# Patient Record
Sex: Male | Born: 1945 | Race: White | Hispanic: No | Marital: Married | Smoking: Former smoker
Health system: Southern US, Community
[De-identification: ages and names within clinical notes are randomized; demographics above are authoritative.]

## PROBLEM LIST (undated history)

## (undated) DIAGNOSIS — C44310 Basal cell carcinoma of skin of unspecified parts of face: Secondary | ICD-10-CM

## (undated) DIAGNOSIS — R748 Abnormal levels of other serum enzymes: Secondary | ICD-10-CM

## (undated) DIAGNOSIS — E119 Type 2 diabetes mellitus without complications: Secondary | ICD-10-CM

## (undated) DIAGNOSIS — F410 Panic disorder [episodic paroxysmal anxiety] without agoraphobia: Secondary | ICD-10-CM

## (undated) DIAGNOSIS — Z955 Presence of coronary angioplasty implant and graft: Secondary | ICD-10-CM

## (undated) DIAGNOSIS — E7800 Pure hypercholesterolemia, unspecified: Secondary | ICD-10-CM

## (undated) DIAGNOSIS — R001 Bradycardia, unspecified: Secondary | ICD-10-CM

## (undated) DIAGNOSIS — I10 Essential (primary) hypertension: Secondary | ICD-10-CM

## (undated) DIAGNOSIS — A048 Other specified bacterial intestinal infections: Secondary | ICD-10-CM

## (undated) DIAGNOSIS — E669 Obesity, unspecified: Secondary | ICD-10-CM

## (undated) DIAGNOSIS — Z8739 Personal history of other diseases of the musculoskeletal system and connective tissue: Secondary | ICD-10-CM

## (undated) DIAGNOSIS — E039 Hypothyroidism, unspecified: Secondary | ICD-10-CM

## (undated) DIAGNOSIS — I351 Nonrheumatic aortic (valve) insufficiency: Secondary | ICD-10-CM

## (undated) DIAGNOSIS — K219 Gastro-esophageal reflux disease without esophagitis: Secondary | ICD-10-CM

## (undated) DIAGNOSIS — D696 Thrombocytopenia, unspecified: Secondary | ICD-10-CM

## (undated) DIAGNOSIS — I2510 Atherosclerotic heart disease of native coronary artery without angina pectoris: Secondary | ICD-10-CM

## (undated) DIAGNOSIS — R000 Tachycardia, unspecified: Secondary | ICD-10-CM

## (undated) DIAGNOSIS — I219 Acute myocardial infarction, unspecified: Secondary | ICD-10-CM

## (undated) HISTORY — DX: Abnormal levels of other serum enzymes: R74.8

## (undated) HISTORY — DX: Panic disorder (episodic paroxysmal anxiety): F41.0

## (undated) HISTORY — PX: TONSILLECTOMY: SUR1361

## (undated) HISTORY — PX: TRIGGER FINGER RELEASE: SHX641

## (undated) HISTORY — DX: Pure hypercholesterolemia, unspecified: E78.00

## (undated) HISTORY — PX: CORONARY ANGIOPLASTY WITH STENT PLACEMENT: SHX49

## (undated) HISTORY — DX: Acute myocardial infarction, unspecified: I21.9

## (undated) HISTORY — DX: Bradycardia, unspecified: R00.1

## (undated) HISTORY — DX: Thrombocytopenia, unspecified: D69.6

## (undated) HISTORY — DX: Obesity, unspecified: E66.9

## (undated) HISTORY — DX: Essential (primary) hypertension: I10

## (undated) HISTORY — DX: Atherosclerotic heart disease of native coronary artery without angina pectoris: I25.10

## (undated) HISTORY — DX: Tachycardia, unspecified: R00.0

## (undated) HISTORY — PX: BASAL CELL CARCINOMA EXCISION: SHX1214

## (undated) HISTORY — DX: Hemochromatosis, unspecified: E83.119

## (undated) HISTORY — DX: Nonrheumatic aortic (valve) insufficiency: I35.1

---

## 2000-11-09 DIAGNOSIS — I219 Acute myocardial infarction, unspecified: Secondary | ICD-10-CM

## 2000-11-09 HISTORY — DX: Acute myocardial infarction, unspecified: I21.9

## 2007-11-15 ENCOUNTER — Encounter (INDEPENDENT_AMBULATORY_CARE_PROVIDER_SITE_OTHER): Payer: Self-pay | Admitting: Orthopedic Surgery

## 2007-11-15 ENCOUNTER — Ambulatory Visit (HOSPITAL_BASED_OUTPATIENT_CLINIC_OR_DEPARTMENT_OTHER): Admission: RE | Admit: 2007-11-15 | Discharge: 2007-11-15 | Payer: Self-pay | Admitting: Orthopedic Surgery

## 2008-10-23 ENCOUNTER — Encounter: Admission: RE | Admit: 2008-10-23 | Discharge: 2008-10-23 | Payer: Self-pay | Admitting: Orthopedic Surgery

## 2008-10-25 ENCOUNTER — Encounter (INDEPENDENT_AMBULATORY_CARE_PROVIDER_SITE_OTHER): Payer: Self-pay | Admitting: Orthopedic Surgery

## 2008-10-25 ENCOUNTER — Ambulatory Visit (HOSPITAL_BASED_OUTPATIENT_CLINIC_OR_DEPARTMENT_OTHER): Admission: RE | Admit: 2008-10-25 | Discharge: 2008-10-25 | Payer: Self-pay | Admitting: Orthopedic Surgery

## 2010-08-29 ENCOUNTER — Ambulatory Visit: Payer: Self-pay | Admitting: Cardiology

## 2011-03-24 NOTE — Op Note (Signed)
NAME:  Miguel Vasquez, BABE                ACCOUNT NO.:  [id number]   MEDICAL RECORD NO.:  [id number]          PATIENT TYPE:  AMB   LOCATION:  DSC                          FACILITY:  [facility]   PHYSICIAN:  Katy Fitch. Sypher, M.D. DATE OF BIRTH:  1946-04-20   DATE OF PROCEDURE:  11/15/2007  DATE OF DISCHARGE:                               OPERATIVE REPORT   PREOPERATIVE DIAGNOSIS:  Recurrent left hand Dupuytren's contracture  status post prior resection from palm and small finger in 1996 at [facility] with recurrence of severe PIP and DIP flexion contractures  of small finger, severe MP and PIP flexion contractures of ring finger  and MP flexion contracture of long finger.   OPERATION:  1. Excision of Dupuytren's contracture of left ring finger and palm      with V-Y advancement flaps for skin lengthening.  2. Resection of Dupuytren's contracture from palm and long finger with      V-Y advancement flaps.  3. Resection of Dupuytren's contracture overlying PIP and DIP joints      of left small finger recurrent requiring complex dissection through      prior surgical scar.   OPERATIONS:  Josephine Igo, M.D.   ASSISTANT:  Annye Rusk, P.A.-C.   ANESTHESIA:  Is general sedation and a left infraclavicular block;  supervising anesthesiologist is Dr. Sampson Goon.   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  He has had a history of gout,  coronary artery disease and Dupuytren's palmar fibromatosis.  He is  status post resection of Dupuytren's disease from his left palm and  small finger performed at [facility] in 1996.  He has had coronary  artery disease and has had multiple stents placed.   He was referred by his primary care physician, Dr. Cherlynn Polo, of [place], [place], for consult due to recurrent bilateral Dupuytren's  disease.  He had very difficult flexion fractures of the small finger at  the PIP  and DIP joint and PIP and MP flexion fractures of the long and  ring fingers.   We advised to proceed with repeat surgery.   He understands that the risk of revision surgery is considerably greater  given our difficulty identifying the neurovascular anatomy in a prior  surgical scar.  He understands that Dupuytren's fibromatosis is not  curable and that our surgery is a palliative salvage procedure to  relieve his flexion contractures and removed most of the bulky nodular  disease in his palm.   He understands that there is a high probability of extension into other  fingers and possible recurrence in the operated finger despite our best  efforts at eradicating this pathologic fibromatosis.  Preoperatively,  questions were invited and answered in detail.  He was provided Ancef as  a preoperative IV prophylactic antibiotic.   PROCEDURE:  Carmino Ocain is brought to the operating room and placed in  supine position on the operating table.   Dr. Sampson Goon provided a preoperative anesthesia consult recommending  proceeding with a left infraclavicular block.  This  was placed in the  holding area without complication.   Mr. Lyness was taken to Room 6, placed in supine position on the operating  table, and IV sedation provided.  A pneumatic tourniquet was applied to  this proximal brachium followed by routine Betadine scrub and paint of  the left arm.  The arm was draped with sterile stockinette and  impervious arthroscopy drapes followed by exsanguination left arm with  Esmarch bandage and inflation of the arterial tourniquet to 240 mmHg.  The procedure commenced with planning Brunner zigzag incisions that  would be lengthened by V-Y advancement technique for skin lengthening on  the palmar surface of the fingers.   The skin incisions were taken sharply and elevated at the plane of the  dermis and pathologic fascia.  The common neurovascular structures were  identified at the level the  superficial palmar arch and dissected  distally.  Extensive pretendinous disease was removed from the small  finger, ring finger and long finger followed by spiral bands, lateral  fascial sheets and central cord in the ring finger, a large radial  lateral fascial sheet nodule and extensive disease crossing the PIP and  DIP joints on the radial aspect of the small finger.  The long finger  had disease in the natatory ligaments and the pretendinous fibers.  After resection of all visible pathologic fascia at the pinning of the  palm and the MP and PIP flexion contractures were relieved in the long  and ring fingers and the PIP flexion contracture was relieved in the  small finger.  The small finger had a PIP flexion contracture due to  scarring of the skin.  This was addressed by both resection of subdermal  fascial nodules and scar followed by an extensive V-Y advancement flap  creation on the palmar surface of the small finger, increasing the skin  length approximately 1 cm.   Bleeding points were electrocauterized with bipolar forceps under saline  followed by repair of the skin with corner sutures of 5-0 nylon and  multiple interrupted sutures of 5-0 nylon.   There were no apparent complications.   Mr. Natividad's finger contractures were fully relieved including the distal  contracture of the small finger.  The hand was placed in a voluminous  dressing of Silvadene, Adaptic, sterile gauze, sterile Kerlix and a  volar plaster splint maintaining the fingers in full extension.  The  tourniquet released with adequate capillary refill in all fingertips.   For aftercare, Mr. Bitter will elevate his hand for 48 hours and is  provided prescriptions for Percocet 5 mg 1 p.o. q.4-6h. p.r.n. pain, 30  tablets without refill.  Also, Motrin 600 mg 1 p.o. q.6h. for as many  days as necessary p.r.n. pain and Keflex 500 mg 1 p.o. q.8h. x4 days.      Katy Fitch Sypher, M.D.  Electronically  Signed     RVS/MEDQ  D:  11/15/2007  T:  11/15/2007  Job:  [id number]   cc:   Dwaine Gale.  Katy Fitch Sypher, M.D.

## 2011-03-24 NOTE — Op Note (Signed)
NAME:  Miguel Vasquez, Miguel Vasquez                ACCOUNT NO.:  [id number]   MEDICAL RECORD NO.:  [id number]          PATIENT TYPE:  AMB   LOCATION:  DSC                          FACILITY:  [facility]   PHYSICIAN:  Katy Fitch. Sypher, M.D. DATE OF BIRTH:  07/27/1946   DATE OF PROCEDURE:  10/25/2008  DATE OF DISCHARGE:                               OPERATIVE REPORT   PREOPERATIVE DIAGNOSIS:  Complex Dupuytren contracture right hand  involving palm, small, ring, index and thumb.   POSTOPERATIVE DIAGNOSIS:  Complex Dupuytren contracture right hand  involving palm, small, ring, index and thumb.   OPERATION:  1. Resection of complex Dupuytren contracture from right palm and      small finger with multiple V-Y advancement flaps for skin      lengthening.  2. Resection of Dupuytren  contracture from palm and ring finger to      separate dissection subfascially.  3. Resection of Dupuytren contracture from index finger.  4. Resection of Dupuytren contracture from the thumb.   SURGEON:  Katy Fitch. Sypher, MD   ASSISTANT:  Annye Rusk PA-C   ANESTHESIA:  General sedation supplemented by a infraclavicular block   SUPERVISING ANESTHESIOLOGIST:  Zenon Mayo, MD.   ESTIMATED BLOOD LOSS:  100 mL.   No replacement.   There were no apparent complications.   TOURNIQUET TIME:  Approximately 1 hour and 55 minutes.   INDICATIONS:  Miguel Vasquez is a 65 year old gentleman with a severe  Dupuytren's diaphysis.  He is status post surgery to the left hand in  January 2009.  He has had a very satisfactory long-term result.  He now  presents for identical surgery on the right.  Preoperatively, he was  reminded of the severe scarring accompanying the Dupuytren's  contracture.  He understands that we will do our best to remove the  large nodules of pathologic fascia and the cords causing his flexion  contractures.  He understands that we cannot control recurrence of this  disease despite our best efforts and  at later date he might require  further surgery and/or skin grafting.   He understands that he will need to splint postoperatively and that  there are risks of surgery including neurovascular injury, possible  infection and a possible rapid recurrence of his disease.   PROCEDURE:  Miguel Vasquez was brought to the operating room and placed in  supine position upon the operating table.   Following placement of an infraclavicular block by Dr. Sampson Goon in the  holding area, excellent anesthesia of the right upper extremity  was  achieved.  He is brought to room 8 of the [facility], placed  in supine position upon the operating table and under Dr. Jarrett Ables  direct supervision sedation provided.   The right arm was prepped with Betadine soap solution and sterilely  draped.  A pneumatic tourniquet was applied to the proximal brachium.   Following exsanguination of the right arm with Esmarch bandage, the  arterial tourniquet was inflated to 220 mmHg.  The procedure commenced  with planning of Brunner zigzag incisions with an open palm technique  overlying the ring and small finger metacarpals.  The thumb was  addressed with a Brunner zigzag incision at the proximal thumb flexion  crease and the index finger with a similar Brunner incision at the  proximal finger flexion crease.   The skin flaps were elevated at the fascia dermis junction followed by  placement of multiple 4-0 silk sutures retaining the skin flaps.  After  undermining the skin, the complex pathologic fascial contracture was  resected from the pretendinous fibers of the ring and small fingers in  the palm, followed by extensive dissection of lateral fascial sheets in  both the radial and ulnar side of the small finger, a retrovascular cord  on the ulnar aspect of the small finger, a central cord distally and a  large nodule on the ulnar aspect of the DIP joint.   A flexion contracture that led to a  pulp-to-palm distance of only 2 cm  was corrected to full extension at the MP, PIP and DIP joints.  We were  required to release the flexor sheath between the A2 and A3 pulleys with  release of the C1 pulley.  The volar plate was likewise released.   In the ring finger, extensive fascial involvement of the natatory  ligaments was removed followed by removal of the nodule overlying the  metacarpal head region.  This fully relieved the MP flexion fracture of  the ring finger.  The interphalangeal joints fully extended after  release of these nodules and cords.  At the thumb index web space, a  very complex nodule was removed at the proximal flexion crease of the  thumb.  We carefully identified the princeps pollicis artery, the ulnar  and radial proper digital nerves and resected the pathologic fascia.  We  performed a similar dissection near the radial proper digital artery  nerve of the index finger on the radial aspect of the finger removing a  large nodule and cord system at the level of the proximal finger flexion  crease.   Some nodular disease in the region of the thumb index web space was left  for fear of causing excessive scar with too much dissection in this  area.   The tourniquet was released after dissection was completed with  immediate capillary fill to the thumb, index, long, ring and within 1  minute small finger.  Normal turgor returned to all fingers.  The wounds  were then irrigated and meticulously inspected for bleeding points which  were electrocauterized with bipolar current under saline followed by  removal of the tourniquet and compression for 5 minutes.  The skin flaps  were then repaired with an open palm technique and an open technique at  the small finger MP flexion crease due to the severe contracture that  was relieved and a concern about possible hematoma formation deep to the  flaps.   After completion of the wound repair with multiple interrupted  sutures  of 5-0 nylon, a dressing of Adaptic, Silvadene, sterile gauze, sterile  Kerlix and sterile Webril was applied with a volar plaster splint  maintaining the MP joints in full extension and comfortable flexion of  the PIP and DIP joints.   There were no apparent complications.  Mr. Crass tolerated the surgery  and anesthesia well.  He was transferred to the recovery room with  stable vital signs.  He will be discharged home with prescriptions for  Dilaudid 2 mg one to two tablets p.o. q.4-6 h. p.r.n. pain 30 tablets  without refill, also Motrin 600 mg one p.o. q.6 h. p.r.n. pain 30  tablets without refill and Keflex 500 mg one p.o. q.8 h. x4 days as a  prophylactic antibiotic.      Katy Fitch Sypher, M.D.  Electronically Signed     RVS/MEDQ  D:  10/25/2008  T:  10/26/2008  Job:  [id number]   cc:   Dwaine Gale.

## 2011-08-14 LAB — BASIC METABOLIC PANEL
BUN: 13 mg/dL (ref 6–23)
BUN: 14
CO2: 24
Chloride: 105 mEq/L (ref 96–112)
Creatinine, Ser: 0.74 mg/dL (ref 0.4–1.5)
GFR calc non Af Amer: >60
Glucose, Bld: 128 mg/dL — ABNORMAL HIGH (ref 70–99)
Glucose, Bld: 126 — ABNORMAL HIGH
Potassium: 3.9

## 2011-09-02 ENCOUNTER — Encounter: Payer: Self-pay | Admitting: Cardiology

## 2011-09-02 ENCOUNTER — Ambulatory Visit (INDEPENDENT_AMBULATORY_CARE_PROVIDER_SITE_OTHER): Payer: Self-pay | Admitting: Cardiology

## 2011-09-02 DIAGNOSIS — E7800 Pure hypercholesterolemia, unspecified: Secondary | ICD-10-CM

## 2011-09-02 DIAGNOSIS — I2510 Atherosclerotic heart disease of native coronary artery without angina pectoris: Secondary | ICD-10-CM | POA: Insufficient documentation

## 2011-09-02 DIAGNOSIS — I259 Chronic ischemic heart disease, unspecified: Secondary | ICD-10-CM

## 2011-09-02 DIAGNOSIS — I10 Essential (primary) hypertension: Secondary | ICD-10-CM | POA: Insufficient documentation

## 2011-09-02 DIAGNOSIS — E119 Type 2 diabetes mellitus without complications: Secondary | ICD-10-CM | POA: Insufficient documentation

## 2011-09-02 MED ORDER — PRAVASTATIN SODIUM 20 MG PO TABS: 20.0000 mg | ORAL_TABLET | Freq: Every evening | ORAL | Status: DC

## 2011-09-02 NOTE — Assessment & Plan Note (Signed)
He has had remote stenting of left circumflex coronary 1996 and stenting of the right coronary in 2002. His last evaluation was 3 years ago. We will schedule him for a followup last stress Myoview study. If this is unremarkable I will see him back again in one year.

## 2011-09-02 NOTE — Patient Instructions (Signed)
We will schedule you for a nuclear stress test.  Continue your current medications.  Watch your diet carefully. You may want to decrease your beer intake.

## 2011-09-02 NOTE — Assessment & Plan Note (Signed)
He needs to do a better job with his diet. Have recommended he reduce his alcohol intake since this will affect his triglycerides. He needs to lose some weight. He is scheduled for followup lab work in January.

## 2011-09-02 NOTE — Assessment & Plan Note (Signed)
Blood pressure control today he is acceptable. We'll continue with his current therapy.

## 2011-09-02 NOTE — Progress Notes (Signed)
[employer] Date of Birth: 1945-11-14 Medical Record #[id number]  History of Present Illness: Miguel Vasquez is seen for yearly followup today. She has been feeling very well. He denies any symptoms of chest pain, shortness of breath, or palpitations. He notes that his Lipitor was discontinued because he was experiencing some muscle aches in his CPK was elevated. He has been on pravastatin for the last 3-4 months. He thinks that the CPK has returned to normal. His blood sugars have been doing well. He admits that he is not been doing very well with his diet and has gained some weight. He also drinks a fair amount of beer. He has a history of coronary disease and is status post stenting of the left circumflex coronary in 1996. He had stenting of the right coronary in 2002.  Current Outpatient Prescriptions on File Prior to Visit  Medication Sig Dispense Refill  . allopurinol (ZYLOPRIM) 300 MG tablet Take 300 mg by mouth daily.        [name] aspirin 81 MG tablet Take 81 mg by mouth daily.        [name] atenolol (TENORMIN) 25 MG tablet Take 25 mg by mouth 2 (two) times daily.       [name] ezetimibe (ZETIA) 10 MG tablet Take 10 mg by mouth daily.        [name] FOLIC ACID PO Take by mouth daily.        [name] levothyroxine (SYNTHROID, LEVOTHROID) 100 MCG tablet Take 100 mcg by mouth daily.        . metFORMIN (GLUCOPHAGE) 500 MG tablet Take 500 mg by mouth daily.        [name] PARoxetine (PAXIL) 20 MG tablet Take 20 mg by mouth daily.        . ramipril (ALTACE) 10 MG capsule Take 10 mg by mouth daily.          No Known Allergies  Past Medical History  Diagnosis Date  . Hypercholesterolemia   . Coronary disease     Status post stenting of the left circumflex coronary in 1996--status post stenting of the right coronary in 2002  . Hypertension     Controlled  . Diabetes mellitus     Type 2  . Hemochromatosis   . Obesity   . History of ulcer disease   . Panic disorder     Hx of panic disorder  . Cervical disc disease    History of Cervical Disc Disease  . Myocardial infarction 2002    Past Surgical History  Procedure Date  . Coronary stent placement     x3    History  Smoking status  . Smoker, Current Status Unknown  Smokeless tobacco  . Not on file    History  Alcohol Use  . Yes    Family History  Problem Relation Age of Onset  . Heart attack Sister   . Cancer Mother     Colon Cancer  . Heart failure Mother   . Cancer Brother     Colon Cancer    Review of Systems: As noted in history of present illness.  All other systems were reviewed and are negative.  Physical Exam: BP 139/88  Pulse 55  Ht 6\' 2"  (1.88 m)  Wt 229 lb 12.8 oz (104.237 kg)  BMI 29.50 kg/m2 The patient is alert and oriented x 3.  The mood and affect are normal.  The skin is warm and dry.  Color is normal.  The HEENT exam reveals  that the sclera are nonicteric.  The mucous membranes are moist.  The carotids are 2+ without bruits.  There is no thyromegaly.  There is no JVD.  The lungs are clear.  The chest wall is non tender.  The heart exam reveals a regular rate with a normal S1 and S2.  There are no murmurs, gallops, or rubs.  The PMI is not displaced.   Abdominal exam reveals good bowel sounds.  There is no guarding or rebound.  There is no hepatosplenomegaly or tenderness.  There are no masses.  Exam of the legs reveal no clubbing, cyanosis, or edema.  The legs are without rashes.  The distal pulses are intact.  Cranial nerves II - XII are intact.  Motor and sensory functions are intact.  The gait is normal.  LABORATORY DATA: ECG today demonstrates sinus bradycardia with a rate of 53 beats per minute. It is otherwise normal. His most recent lipid panel on 07/14/2011 showed a total cholesterol of 214, triglycerides 365, HDL 41, and LDL of 100.  Assessment / Plan:

## 2011-09-16 ENCOUNTER — Ambulatory Visit (HOSPITAL_COMMUNITY): Payer: BC Managed Care – PPO | Attending: Cardiovascular Disease | Admitting: Radiology

## 2011-09-16 VITALS — Ht 74.0 in | Wt 229.0 lb

## 2011-09-16 DIAGNOSIS — I10 Essential (primary) hypertension: Secondary | ICD-10-CM | POA: Insufficient documentation

## 2011-09-16 DIAGNOSIS — Z87891 Personal history of nicotine dependence: Secondary | ICD-10-CM | POA: Insufficient documentation

## 2011-09-16 DIAGNOSIS — Z8249 Family history of ischemic heart disease and other diseases of the circulatory system: Secondary | ICD-10-CM | POA: Insufficient documentation

## 2011-09-16 DIAGNOSIS — R0609 Other forms of dyspnea: Secondary | ICD-10-CM | POA: Insufficient documentation

## 2011-09-16 DIAGNOSIS — I252 Old myocardial infarction: Secondary | ICD-10-CM

## 2011-09-16 DIAGNOSIS — I2510 Atherosclerotic heart disease of native coronary artery without angina pectoris: Secondary | ICD-10-CM | POA: Insufficient documentation

## 2011-09-16 DIAGNOSIS — R0989 Other specified symptoms and signs involving the circulatory and respiratory systems: Secondary | ICD-10-CM | POA: Insufficient documentation

## 2011-09-16 DIAGNOSIS — E119 Type 2 diabetes mellitus without complications: Secondary | ICD-10-CM

## 2011-09-16 DIAGNOSIS — E785 Hyperlipidemia, unspecified: Secondary | ICD-10-CM | POA: Insufficient documentation

## 2011-09-16 MED ORDER — TECHNETIUM TC 99M TETROFOSMIN IV KIT
33.0000 | PACK | Freq: Once | INTRAVENOUS | Status: AC | PRN
  Administered 2011-09-16: 33 via INTRAVENOUS

## 2011-09-16 MED ORDER — TECHNETIUM TC 99M TETROFOSMIN IV KIT
11.0000 | PACK | Freq: Once | INTRAVENOUS | Status: AC | PRN
  Administered 2011-09-16: 11 via INTRAVENOUS

## 2011-09-16 NOTE — Progress Notes (Signed)
[facility] SITE 3 NUCLEAR MED [address] [place] [address] [phone]  Cardiology Nuclear Med Study  Miguel Vasquez is a 65 y.o. male [id number] 22-Sep-1946   Nuclear Med Background Indication for Stress Test:  Evaluation for Ischemia, Stent Patency and Follow-up CAD History: '02 Heart Catheterization: RCA stent x2, '02 Myocardial Infarction, 10/09 Myocardial Perfusion Study: EF 66% and "02 Stents: RCA Cardiac Risk Factors: Family History - CAD, History of Smoking, Hypertension, Lipids and NIDDM  Symptoms:  DOE   Nuclear Pre-Procedure Caffeine/Decaff Intake:  None NPO After: 6:00pm   Lungs:  clear IV 0.9% NS with Angio Cath:  20g  IV Site: R Antecubital x 1, tolerated well IV Started by:  Irean Hong, RN  Chest Size (in):  44 Cup Size: n/a  Height: 6\' 2"  (1.88 m)  Weight:  229 lb (103.874 kg)  BMI:  Body mass index is 29.40 kg/(m^2). Tech Comments:  Held atenolol x 60 hrs    Nuclear Med Study 1 or 2 day study: 1 day  Stress Test Type:  Stress  Reading MD: Charlton Haws, MD  Order Authorizing Provider:  Ercia Crisafulli [place], MD  Resting Radionuclide: Technetium 7m Tetrofosmin  Resting Radionuclide Dose: 11.0 mCi   Stress Radionuclide:  Technetium 64m Tetrofosmin  Stress Radionuclide Dose: 33.0 mCi           Stress Protocol Rest HR: 59 Stress HR: 151  Rest BP: 150/93 Stress BP: 191/88  Exercise Time (min): 5:00 METS: 7.0   Predicted Max HR: 158 bpm % Max HR: 95.57 bpm Rate Pressure Product: 64403   Dose of Adenosine (mg):  n/a Dose of Lexiscan: n/a mg  Dose of Atropine (mg): n/a Dose of Dobutamine: n/a mcg/kg/min (at max HR)  Stress Test Technologist: Milana Na, EMT-P  Nuclear Technologist:  Doyne Keel, CNMT     Rest Procedure:  Myocardial perfusion imaging was performed at rest 45 minutes following the intravenous administration of Technetium 50m Tetrofosmin. Rest ECG: NSR  Stress Procedure:  The patient exercised for 5:00.  The patient  stopped due to doe, fatigue and denied any chest pain.  There were no significant ST-T wave changes.  Technetium 45m Tetrofosmin was injected at peak exercise and myocardial perfusion imaging was performed after a brief delay. Stress ECG: No significant change from baseline ECG  QPS Raw Data Images:  Normal; no motion artifact; normal heart/lung ratio. Stress Images:  There is decreased uptake in the inferior wall. Rest Images:  There is decreased uptake in the inferior wall. Subtraction (SDS):  There is a fixed defect that is most consistent with a previous infarction. Transient Ischemic Dilatation (Normal <1.22):  1.00 Lung/Heart Ratio (Normal <0.45):  0.32  Quantitative Gated Spect Images QGS EDV:  118 ml QGS ESV:  53 ml QGS cine images:  NL LV Function; NL Wall Motion QGS EF: 55%  Impression Exercise Capacity:  Poor exercise capacity. BP Response:  Normal blood pressure response. Clinical Symptoms:  There is dyspnea. ECG Impression:  No significant ST segment change suggestive of ischemia. Comparison with Prior Nuclear Study: No images to compare  Overall Impression:  Small inferobasal wall infarct.  SDS 6 but think there is misregistration of inferobase on vertical images.  No significant ischemia on qualitative review.   normal EF 55%    Charlton Haws

## 2011-09-17 ENCOUNTER — Encounter (HOSPITAL_COMMUNITY): Payer: BC Managed Care – PPO | Admitting: Radiology

## 2011-09-17 NOTE — Progress Notes (Signed)
lm

## 2011-09-18 ENCOUNTER — Telehealth: Payer: Self-pay | Admitting: Cardiology

## 2011-09-18 NOTE — Telephone Encounter (Signed)
Pt calling wanting to know results of CT scan. Please return pt call to discuss further.

## 2011-09-18 NOTE — Telephone Encounter (Signed)
Calling for results of myocardial perfusion scan.  Results given

## 2011-09-21 NOTE — Telephone Encounter (Signed)
Called having some questions re: stress test. Answered and he verbalizes understanding.

## 2011-09-21 NOTE — Telephone Encounter (Signed)
Pt has called back. [phone]. Please call within 30 minutes if at all possible.

## 2011-09-21 NOTE — Telephone Encounter (Signed)
Pt was calling back about test results will be leaving his home in 30 minutes.

## 2011-10-08 ENCOUNTER — Encounter: Payer: Self-pay | Admitting: Cardiology

## 2012-09-19 ENCOUNTER — Ambulatory Visit (INDEPENDENT_AMBULATORY_CARE_PROVIDER_SITE_OTHER): Payer: BC Managed Care – PPO | Admitting: Cardiology

## 2012-09-19 ENCOUNTER — Encounter: Payer: Self-pay | Admitting: Cardiology

## 2012-09-19 VITALS — BP 144/92 | HR 54 | Ht 74.0 in | Wt 237.0 lb

## 2012-09-19 DIAGNOSIS — I10 Essential (primary) hypertension: Secondary | ICD-10-CM

## 2012-09-19 DIAGNOSIS — E7800 Pure hypercholesterolemia, unspecified: Secondary | ICD-10-CM

## 2012-09-19 DIAGNOSIS — I2510 Atherosclerotic heart disease of native coronary artery without angina pectoris: Secondary | ICD-10-CM

## 2012-09-19 DIAGNOSIS — I259 Chronic ischemic heart disease, unspecified: Secondary | ICD-10-CM

## 2012-09-19 NOTE — Patient Instructions (Signed)
You need to focus more on your conditioning with increased aerobic activity and weight loss.  Continue your current medication

## 2012-09-19 NOTE — Progress Notes (Signed)
Miguel Vasquez Date of Birth: May 10, 1946 Medical Record #[id number]  History of Present Illness: Miguel Vasquez is seen for yearly followup today.  He has a history of coronary disease and is status post stenting of the left circumflex coronary in 1996. He had stenting of the right coronary in 2002. She is well. He denies any symptoms of chest pain or shortness of breath. He is currently off all statins because of muscle aches. He did have elevated CPK levels noted with Lipitor. He remains on Zetia. He reports his blood sugars have been less than 120. He has gained about 8 pounds this year and admits that he doesn't get much exercise. He did have a stress Myoview study one year ago showed a small fixed defect in the inferior base. This is felt to be artifactual. There is no evidence of ischemia and ejection fraction was normal at 58%.  Current Outpatient Prescriptions on File Prior to Visit  Medication Sig Dispense Refill  . allopurinol (ZYLOPRIM) 300 MG tablet Take 300 mg by mouth daily.        [name] aspirin 81 MG tablet Take 81 mg by mouth daily.        [name] atenolol (TENORMIN) 25 MG tablet Take 25 mg by mouth 2 (two) times daily.       [name] ezetimibe (ZETIA) 10 MG tablet Take 10 mg by mouth daily.        [name] FOLIC ACID PO Take by mouth daily.        [name] levothyroxine (SYNTHROID, LEVOTHROID) 100 MCG tablet Take 100 mcg by mouth daily.        . metFORMIN (GLUCOPHAGE) 500 MG tablet Take 500 mg by mouth daily.        [name] PARoxetine (PAXIL) 20 MG tablet Take 20 mg by mouth daily.        . ramipril (ALTACE) 10 MG capsule Take 10 mg by mouth daily.          No Known Allergies  Past Medical History  Diagnosis Date  . Hypercholesterolemia   . Coronary disease     Status post stenting of the left circumflex coronary in 1996--status post stenting of the right coronary in 2002  . Hypertension     Controlled  . Diabetes mellitus     Type 2  . Hemochromatosis   . Obesity   . History of ulcer disease   . Panic  disorder     Hx of panic disorder  . Cervical disc disease     History of Cervical Disc Disease  . Myocardial infarction 2002    Past Surgical History  Procedure Date  . Coronary stent placement     x3    History  Smoking status  . Former Smoker  . Start date: 09/20/1995  Smokeless tobacco  . Not on file    History  Alcohol Use  . Yes    Family History  Problem Relation Age of Onset  . Heart attack Sister   . Cancer Mother     Colon Cancer  . Heart failure Mother   . Cancer Brother     Colon Cancer    Review of Systems: As noted in history of present illness.  All other systems were reviewed and are negative.  Physical Exam: BP 144/92  Pulse 54  Ht 6\' 2"  (1.88 m)  Wt 237 lb (107.502 kg)  BMI 30.43 kg/m2 The patient is alert and oriented x 3.  The mood and affect are normal.  The skin is warm and dry.  Color is normal.  The HEENT exam reveals that the sclera are nonicteric.  The mucous membranes are moist.  The carotids are 2+ without bruits.  There is no thyromegaly.  There is no JVD.  The lungs are clear.  The chest wall is non tender.  The heart exam reveals a regular rate with a normal S1 and S2.  There are no murmurs, gallops, or rubs.  The PMI is not displaced.   Abdominal exam reveals good bowel sounds.  There is no guarding or rebound.  There is no hepatosplenomegaly or tenderness.  There are no masses.  Exam of the legs reveal no clubbing, cyanosis, or edema.  The legs are without rashes.  The distal pulses are intact.  Cranial nerves II - XII are intact.  Motor and sensory functions are intact.  The gait is normal.  LABORATORY DATA: ECG today demonstrates sinus bradycardia with a rate of 54 beats per minute. It is otherwise normal.   Assessment / Plan: 1. Coronary disease with prior interventions as noted. He is asymptomatic. His stress test for one year ago was negative. It did indicate significant deconditioning. I strongly encouraged him to participate  in regular aerobic exercise and to lose weight.  2. Hypercholesterolemia. Currently on Zetia. Also statins because of myalgias.  3. Diabetes mellitus type 2.  4. Hypertension. I think this would benefit significantly from weight loss and aerobic exercise.

## 2013-06-06 ENCOUNTER — Encounter: Payer: Self-pay | Admitting: Cardiology

## 2013-10-02 ENCOUNTER — Encounter (INDEPENDENT_AMBULATORY_CARE_PROVIDER_SITE_OTHER): Payer: Self-pay

## 2013-10-02 ENCOUNTER — Ambulatory Visit (INDEPENDENT_AMBULATORY_CARE_PROVIDER_SITE_OTHER): Payer: BC Managed Care – PPO | Admitting: Cardiology

## 2013-10-02 ENCOUNTER — Encounter: Payer: Self-pay | Admitting: Cardiology

## 2013-10-02 VITALS — BP 126/68 | HR 53 | Ht 74.0 in | Wt 214.8 lb

## 2013-10-02 DIAGNOSIS — I2510 Atherosclerotic heart disease of native coronary artery without angina pectoris: Secondary | ICD-10-CM

## 2013-10-02 DIAGNOSIS — I259 Chronic ischemic heart disease, unspecified: Secondary | ICD-10-CM

## 2013-10-02 DIAGNOSIS — E7800 Pure hypercholesterolemia, unspecified: Secondary | ICD-10-CM

## 2013-10-02 DIAGNOSIS — I10 Essential (primary) hypertension: Secondary | ICD-10-CM

## 2013-10-02 NOTE — Patient Instructions (Signed)
Continue your current therapy  I will see you in one year   

## 2013-10-02 NOTE — Progress Notes (Signed)
Miguel Vasquez Date of Birth: 1946/07/04 Medical Record #[id number]  History of Present Illness: Miguel Vasquez is seen for yearly followup today.  He has a history of coronary disease and is status post stenting of the left circumflex coronary in 1996. He had stenting of the right coronary in 2002. His last Myoview study was in November of 2012.  He denies any symptoms of chest pain or shortness of breath. He is currently off all statins because of muscle aches. He did have elevated CPK levels noted with Lipitor. He remains on Zetia. Was recently started on cholestyramine. LDL is up to 212. Triglycerides are 339. He has been watching his diet much closer and has lost 24 pounds. He is feeling very well and remains active.  Current Outpatient Prescriptions on File Prior to Visit  Medication Sig Dispense Refill  . allopurinol (ZYLOPRIM) 300 MG tablet Take 300 mg by mouth daily.        [name] aspirin 81 MG tablet Take 81 mg by mouth daily.        [name] atenolol (TENORMIN) 25 MG tablet Take 25 mg by mouth 2 (two) times daily.       [name] ezetimibe (ZETIA) 10 MG tablet Take 10 mg by mouth daily.        [name] FOLIC ACID PO Take by mouth daily.        [name] levothyroxine (SYNTHROID, LEVOTHROID) 100 MCG tablet Take 100 mcg by mouth daily.        . metFORMIN (GLUCOPHAGE) 500 MG tablet Take 500 mg by mouth daily.        [name] PARoxetine (PAXIL) 20 MG tablet Take 20 mg by mouth daily.        . ramipril (ALTACE) 10 MG capsule Take 10 mg by mouth daily.         No current facility-administered medications on file prior to visit.    No Known Allergies  Past Medical History  Diagnosis Date  . Hypercholesterolemia   . Coronary disease     Status post stenting of the left circumflex coronary in 1996--status post stenting of the right coronary in 2002  . Hypertension     Controlled  . Diabetes mellitus     Type 2  . Hemochromatosis   . Obesity   . History of ulcer disease   . Panic disorder     Hx of panic disorder  . Cervical  disc disease     History of Cervical Disc Disease  . Myocardial infarction 2002    Past Surgical History  Procedure Laterality Date  . Coronary stent placement      x3    History  Smoking status  . Former Smoker  . Start date: 09/20/1995  Smokeless tobacco  . Not on file    History  Alcohol Use  . Yes    Family History  Problem Relation Age of Onset  . Heart attack Sister   . Cancer Mother     Colon Cancer  . Heart failure Mother   . Cancer Brother     Colon Cancer    Review of Systems: As noted in history of present illness.  All other systems were reviewed and are negative.  Physical Exam: BP 126/68  Pulse 53  Ht 6\' 2"  (1.88 m)  Wt 214 lb 12.8 oz (97.433 kg)  BMI 27.57 kg/m2 He is a pleasant white male in no acute distress. The HEENT exam is normal.  The carotids are 2+ without bruits.  There is no thyromegaly.  There is no JVD.  The lungs are clear.  The heart exam reveals a regular rate with a normal S1 and S2.  There are no murmurs, gallops, or rubs.  The PMI is not displaced.   Abdominal exam reveals good bowel sounds.  There is no guarding or rebound.  There is no hepatosplenomegaly or tenderness.  There are no masses.  Exam of the legs reveal no clubbing, cyanosis, or edema.   The distal pulses are intact.  Cranial nerves II - XII are intact.  Motor and sensory functions are intact.  The gait is normal.  LABORATORY DATA: ECG today demonstrates sinus bradycardia with a rate of 49 beats per minute. It is otherwise normal.   Assessment / Plan: 1. Coronary disease with prior interventions as noted. He is asymptomatic. His stress test in November 2012 was negative. We will continue his current medical therapy and followup again in one year. Consider stress testing again next year.  2. Hypercholesterolemia. Currently on Zetia. Also statins because of myalgias. Now on Zetia and cholestyramine.  3. Diabetes mellitus type 2.  4. Hypertension. Blood pressure is  improved.

## 2014-09-09 HISTORY — PX: CARDIAC CATHETERIZATION: SHX172

## 2014-09-11 ENCOUNTER — Telehealth: Payer: Self-pay | Admitting: Cardiology

## 2014-09-11 NOTE — Telephone Encounter (Signed)
New Message  Pt called to move the appt up... Just in case he has a blockage,, He says that he is having signs of upper chest lower neck discomfort. Request a sooner appt. Declined PA or NP.

## 2014-09-11 NOTE — Telephone Encounter (Signed)
Spoke with patient. He is having some minor ("nothing major") chest discomfort. He would like a sooner appointment with Dr. Martinique. He was informed that there is nothing sooner, but was offered NP or PA appointment which he declined. Will forward to [place] to see if there is anything to be done about this.

## 2014-09-12 NOTE — Telephone Encounter (Signed)
Returned call to patient no answer.LMTC. 

## 2014-09-14 NOTE — Telephone Encounter (Signed)
Returned call to patient no answer.LMTC. 

## 2014-09-17 NOTE — Telephone Encounter (Signed)
Received call from patient he stated he has been having chest discomfort similar to pain he had before his stents.Stated has chest discomfort off and on, some days no chest discomfort.Stated he has appointment with Cecilie Kicks NP Thurs 09/20/14.Stated he would like to have stress test done.Stated he saw PCP last week and was told chest discomfort was reflux.Stated he wanted to make sure.Advised to keep appointment with Cecilie Kicks NP and she will order stress test.Advised to keep appointment with Dr.Jordan 10/08/14 at 3:45 pm.

## 2014-09-20 ENCOUNTER — Ambulatory Visit (INDEPENDENT_AMBULATORY_CARE_PROVIDER_SITE_OTHER): Payer: Medicare HMO | Admitting: Cardiology

## 2014-09-20 ENCOUNTER — Encounter: Payer: Self-pay | Admitting: Cardiology

## 2014-09-20 VITALS — BP 130/88 | HR 58 | Ht 74.0 in | Wt 223.9 lb

## 2014-09-20 DIAGNOSIS — I10 Essential (primary) hypertension: Secondary | ICD-10-CM

## 2014-09-20 DIAGNOSIS — E7800 Pure hypercholesterolemia, unspecified: Secondary | ICD-10-CM

## 2014-09-20 DIAGNOSIS — R079 Chest pain, unspecified: Secondary | ICD-10-CM

## 2014-09-20 DIAGNOSIS — I2510 Atherosclerotic heart disease of native coronary artery without angina pectoris: Secondary | ICD-10-CM

## 2014-09-20 MED ORDER — FENOFIBRATE 145 MG PO TABS: 145.0000 mg | ORAL_TABLET | Freq: Every day | ORAL | Status: DC

## 2014-09-20 MED ORDER — CHOLESTYRAMINE LIGHT 4 G PO PACK: 4.0000 g | PACK | Freq: Two times a day (BID) | ORAL | Status: DC

## 2014-09-20 MED ORDER — PANTOPRAZOLE SODIUM 40 MG PO TBEC: 40.0000 mg | DELAYED_RELEASE_TABLET | Freq: Every day | ORAL | Status: AC

## 2014-09-20 NOTE — Assessment & Plan Note (Signed)
controlled 

## 2014-09-20 NOTE — Assessment & Plan Note (Signed)
Has had recent chest pain, though does seem improved with protonix.  He is due for exercise myoview, 3 years since last with hx of CAD.  Will proceed with exercise myoview.  He will then keep appt with Dr. Mamie Nick. Martinique.

## 2014-09-20 NOTE — Progress Notes (Signed)
09/20/2014   PCP: Coy Saunas, MD   Chief Complaint  Patient presents with  . Follow-up    has appt with Dr.Jordan 11/30. had been having discomfort in chest PCP determined it was Acid Reflux. Dr.Jordan had metioned a stress test, patient wants stress test to make sure it is reflux    Primary Cardiologist:Dr. P. Martinique   HPI:  68 year old male with a history of coronary disease and is status post stenting of the left circumflex coronary in 1996. He had stenting of the right coronary in 2002. His last Myoview study was in November of 2012. He is currently off all statins because of muscle aches. He did have elevated CPK levels noted with Lipitor. He remains on Zetia. Was recently started on cholestyramine and fenofibrate.[name] LDL is now 145. Triglycerides are 357, tot. Chol is 257.  He has been watching his diet much closer and has lost 24 pounds but has gained back some. He is feeling well today and remains active.  Recently chest discomfort and seen by PCP.   Thought it was PPI symptoms and  seems better on Protonix.  Pt concerned about his heart- on last visit Dr. P. Martinique had recommended stress test this year.  The pt feels the discomfort similar to when he had stent placed.     Allergies  Allergen Reactions  . Statins Other (See Comments)    "Affects liver enzymes"    Current Outpatient Prescriptions  Medication Sig Dispense Refill  . allopurinol (ZYLOPRIM) 300 MG tablet Take 150 mg by mouth daily.     [name] aspirin 81 MG tablet Take 81 mg by mouth daily.      [name] atenolol (TENORMIN) 25 MG tablet Take 25 mg by mouth 2 (two) times daily.     [name] ezetimibe (ZETIA) 10 MG tablet Take 10 mg by mouth daily.      [name] FOLIC ACID PO Take by mouth daily.      [name] levothyroxine (SYNTHROID, LEVOTHROID) 100 MCG tablet Take 100 mcg by mouth daily.      . metFORMIN (GLUCOPHAGE) 500 MG tablet Take 500 mg by mouth 2 (two) times daily with a meal.     . OVER THE COUNTER MEDICATION  Cholestyram    . PARoxetine (PAXIL) 20 MG tablet Take 20 mg by mouth daily.      . ramipril (ALTACE) 10 MG capsule Take 10 mg by mouth daily.       No current facility-administered medications for this visit.    MEDS HAVE BEEN ADDED THAT WERE NOT ON MED LIST,  CHOLESTYRAMINE, FENFIBRATE 145 MG AND PROTONIX.   Past Medical History  Diagnosis Date  . Hypercholesterolemia   . Coronary disease     Status post stenting of the left circumflex coronary in 1996--status post stenting of the right coronary in 2002  . Hypertension     Controlled  . Diabetes mellitus     Type 2  . Hemochromatosis   . Obesity   . History of ulcer disease   . Panic disorder     Hx of panic disorder  . Cervical disc disease     History of Cervical Disc Disease  . Myocardial infarction 2002    Past Surgical History  Procedure Laterality Date  . Coronary stent placement      x3    GEX:BMWUXLK:GM colds or fevers, no weight changes Skin:no rashes or ulcers HEENT:no blurred vision, no congestion  CV:see HPI PUL:see HPI GI:no diarrhea constipation or melena, + indigestion if that was cause of pain. GU:no hematuria, no dysuria MS:no joint pain, no claudication Neuro:no syncope, no lightheadedness Endo:+ diabetes, + thyroid disease  Wt Readings from Last 3 Encounters:  09/20/14 223 lb 14.4 oz (101.56 kg)  10/02/13 214 lb 12.8 oz (97.433 kg)  09/19/12 237 lb (107.502 kg)    PHYSICAL EXAM BP 130/88 mmHg  Pulse 58  Ht 6\' 2"  (1.88 m)  Wt 223 lb 14.4 oz (101.56 kg)  BMI 28.73 kg/m2 General:Pleasant affect, NAD Skin:Warm and dry, brisk capillary refill HEENT:normocephalic, sclera clear, mucus membranes moist Neck:supple, no JVD, no bruits  Heart:S1S2 RRR without murmur, gallup, rub or click Lungs:clear without rales, rhonchi, or wheezes YEB:XIDH, non tender, + BS, do not palpate liver spleen or masses Ext:no lower ext edema, 2+ pedal pulses, 2+ radial pulses Neuro:alert and oriented, MAE, follows  commands, + facial symmetry  EKG:SR rate higher than last year but otherwise no changes.  ASSESSMENT AND PLAN Chest pain Has had recent chest pain, though does seem improved with protonix.  He is due for exercise myoview, 3 years since last with hx of CAD.  Will proceed with exercise myoview.  He will then keep appt with Dr. Mamie Nick. Martinique.  Coronary disease Status post stenting of the left circumflex coronary in 1996--status post stenting of the right coronary in 2002  for exercise myoview.  Follow up with Dr. P. Martinique.  Hypertension controlled  Hypercholesterolemia Followed by PCP, but improved from last year, intolerant to statins.

## 2014-09-20 NOTE — Assessment & Plan Note (Signed)
Status post stenting of the left circumflex coronary in 1996--status post stenting of the right coronary in 2002  for exercise myoview.  Follow up with Dr. P. Martinique.

## 2014-09-20 NOTE — Assessment & Plan Note (Signed)
Followed by PCP, but improved from last year, intolerant to statins.

## 2014-09-20 NOTE — Patient Instructions (Signed)
Your physician recommends that you schedule a follow-up appointment in: Keep appointment with Dr Neita Garnet 11/30  Your physician has requested that you have en exercise stress myoview. For further information please visit [email or web address]. Please follow instruction sheet, as given.

## 2014-09-25 ENCOUNTER — Ambulatory Visit (HOSPITAL_COMMUNITY)
Admission: RE | Admit: 2014-09-25 | Discharge: 2014-09-25 | Disposition: A | Discharge status: Home / Self Care | Payer: Medicare HMO | Source: Ambulatory Visit | Attending: Cardiovascular Disease | Admitting: Cardiovascular Disease

## 2014-09-25 DIAGNOSIS — E119 Type 2 diabetes mellitus without complications: Secondary | ICD-10-CM | POA: Diagnosis not present

## 2014-09-25 DIAGNOSIS — Z87891 Personal history of nicotine dependence: Secondary | ICD-10-CM | POA: Insufficient documentation

## 2014-09-25 DIAGNOSIS — R079 Chest pain, unspecified: Secondary | ICD-10-CM

## 2014-09-25 DIAGNOSIS — E785 Hyperlipidemia, unspecified: Secondary | ICD-10-CM | POA: Diagnosis not present

## 2014-09-25 DIAGNOSIS — Z8249 Family history of ischemic heart disease and other diseases of the circulatory system: Secondary | ICD-10-CM | POA: Diagnosis not present

## 2014-09-25 DIAGNOSIS — I10 Essential (primary) hypertension: Secondary | ICD-10-CM | POA: Diagnosis not present

## 2014-09-25 DIAGNOSIS — I2510 Atherosclerotic heart disease of native coronary artery without angina pectoris: Secondary | ICD-10-CM

## 2014-09-25 MED ORDER — TECHNETIUM TC 99M SESTAMIBI GENERIC - CARDIOLITE
30.0000 | Freq: Once | INTRAVENOUS | Status: AC | PRN
  Administered 2014-09-25: 30 via INTRAVENOUS

## 2014-09-25 MED ORDER — TECHNETIUM TC 99M SESTAMIBI GENERIC - CARDIOLITE
10.0000 | Freq: Once | INTRAVENOUS | Status: AC | PRN
  Administered 2014-09-25: 10 via INTRAVENOUS

## 2014-09-25 NOTE — Procedures (Addendum)
[facility] [address] [place] [address] [phone]  Cardiology Nuclear Med Study  Miguel Vasquez is a 68 y.o. male     MRN : [id number]     DOB: 12/01/1945  Procedure Date: 09/25/2014  Nuclear Med Background Indication for Stress Test:  Stent Patency History:  CAD;MI-2002;STENT/PTCA-1996 AND 2002;No prior respiratory histroy reported;Last NUC MPI on 09/15/2001-nonischemic;EF=56% Cardiac Risk Factors: Family History - CAD, History of Smoking, Hypertension, Lipids and NIDDM  Symptoms:  Chest Pain   Nuclear Pre-Procedure Caffeine/Decaff Intake:  7:00pm NPO After: 5:00am   IV Site: R Antecubital  IV 0.9% NS with Angio Cath:  22g  Chest Size (in):  46"  IV Started by: Otho Perl, CNMT  Height: 6\' 2"  (1.88 m)  Cup Size: n/a  BMI:  Body mass index is 28.62 kg/(m^2). Weight:  223 lb (101.152 kg)   Tech Comments:  n/a    Nuclear Med Study 1 or 2 day study: 1 day  Stress Test Type:  Stress  Order Authorizing Provider:  Peter Martinique, MD   Resting Radionuclide: Technetium 25m Sestamibi  Resting Radionuclide Dose: 10.1 mCi   Stress Radionuclide:  Technetium 46m Sestamibi  Stress Radionuclide Dose: 31.2 mCi           Stress Protocol Rest HR: 60 Stress HR: 134  Rest BP: 147/95 Stress BP: 172/77  Exercise Time (min): 6 METS: 7.   Predicted Max HR: 155 bpm % Max HR: 86.45 bpm Rate Pressure Product: 23048  Dose of Adenosine (mg):  n/a Dose of Lexiscan: n/a mg  Dose of Atropine (mg): n/a Dose of Dobutamine: n/a mcg/kg/min (at max HR)  Stress Test Technologist: Leane Para, CCT Nuclear Technologist: Otho Perl, CNMT   Rest Procedure:  Myocardial perfusion imaging was performed at rest 45 minutes following the intravenous administration of Technetium 55m Sestamibi. Stress Procedure: The patient performed treadmil exercise using a Bruce Protocol for 6 minutes. The Patient stopped due to SOB, Fatigue and leg  fatigue and denied chest pain.  There were significant ST-T wave changes.  Technetium 57m Sestambi was injected IV at peak exercise and myocardial perfusion imaging was performed after a brief delay.  Transient Ischemic Dilatation (Normal <1.22):  0.99 QGS EDV:  112 ml QGS ESV:  50 ml LV Ejection Fraction: 56%  Rest ECG: NSR - Normal EKG  Stress ECG: Significant ST abnormalities consistent with ischemia.  QPS Raw Data Images:  Normal; no motion artifact; normal heart/lung ratio. Stress Images:  There is decreased uptake in the inferior wall. Rest Images:  Mildly decreased basal inferior uptake Subtraction (SDS):  7. Reversible inferior ischemia  Impression Exercise Capacity:  Fair exercise capacity. BP Response:  Normal blood pressure response. Clinical Symptoms:  No significant symptoms noted. ECG Impression:  Significant ST abnormalities consistent with ischemia. Comparison with Prior Nuclear Study: 2012 -small inferolateral infarct  Overall Impression:  Intermediate risk stress nuclear study with a moderate-sized and intensity reversible inferior defect (SDS 7) suggestive of inferior ischemia. Abnormal exercise EKG with 2.5 mm horizontal ST depression inferolaterally with peak stress which is also suggestive of ischemia.  LV Wall Motion:  LVEF 56%, inferior hypokinesis  Miguel Casino, MD, [place] Certified in Nuclear Cardiology Attending Cardiologist [place], MD  09/25/2014 12:21 PM

## 2014-09-27 ENCOUNTER — Ambulatory Visit
Admission: RE | Admit: 2014-09-27 | Discharge: 2014-09-27 | Disposition: A | Discharge status: Home / Self Care | Payer: Medicare HMO | Source: Ambulatory Visit | Attending: Cardiology | Admitting: Cardiology

## 2014-09-27 ENCOUNTER — Encounter (HOSPITAL_COMMUNITY): Payer: BC Managed Care – PPO

## 2014-09-27 ENCOUNTER — Telehealth: Payer: Self-pay | Admitting: Cardiology

## 2014-09-27 ENCOUNTER — Other Ambulatory Visit: Payer: Self-pay | Admitting: Cardiology

## 2014-09-27 DIAGNOSIS — R9439 Abnormal result of other cardiovascular function study: Secondary | ICD-10-CM

## 2014-09-27 DIAGNOSIS — I200 Unstable angina: Secondary | ICD-10-CM

## 2014-09-27 LAB — CBC WITH DIFFERENTIAL/PLATELET
Basophils Absolute: 0 10*3/uL (ref 0.0–0.1)
Basophils Relative: 0 % (ref 0–1)
Eosinophils Absolute: 0.1 10*3/uL (ref 0.0–0.7)
Eosinophils Relative: 3 % (ref 0–5)
HEMATOCRIT: 40.9 % (ref 39.0–52.0)
HEMOGLOBIN: 14.2 g/dL (ref 13.0–17.0)
LYMPHS ABS: 1.6 10*3/uL (ref 0.7–4.0)
LYMPHS PCT: 34 % (ref 12–46)
MCH: 31.3 pg (ref 26.0–34.0)
MCHC: 34.7 g/dL (ref 30.0–36.0)
MCV: 90.1 fL (ref 78.0–100.0)
MONOS PCT: 7 % (ref 3–12)
MPV: 10.5 fL (ref 9.4–12.4)
Monocytes Absolute: 0.3 10*3/uL (ref 0.1–1.0)
NEUTROS ABS: 2.6 10*3/uL (ref 1.7–7.7)
NEUTROS PCT: 56 % (ref 43–77)
Platelets: 166 10*3/uL (ref 150–400)
RBC: 4.54 MIL/uL (ref 4.22–5.81)
RDW: 12.7 % (ref 11.5–15.5)
WBC: 4.7 10*3/uL (ref 4.0–10.5)

## 2014-09-27 MED ORDER — ISOSORBIDE MONONITRATE ER 30 MG PO TB24: 30.0000 mg | ORAL_TABLET | Freq: Every day | ORAL | Status: DC

## 2014-09-27 NOTE — Telephone Encounter (Signed)
Spoke to patient he stated he wanted to know results of recent myoview.Advised results need to be reviewed by Dr.Jordan.Stated he has been having pain in bottom of throat off and on for the past 2 weeks.Stated pain is similar to the pain he had when he had coronary stents.No pain at present.Message sent to [place] for advice.

## 2014-09-27 NOTE — Telephone Encounter (Signed)
Mr. Sluka needs a cath. I can do next week. If his symptoms are unstable he could be scheduled sooner with one of my partners.   Carrington Olazabal Martinique MD, [facility]

## 2014-09-27 NOTE — Addendum Note (Signed)
Addended by: Golden Hurter D on: 09/27/2014 01:17 PM   Modules accepted: Orders

## 2014-09-27 NOTE — Telephone Encounter (Signed)
Returned call to patient he stated he had episode of pain in bottom of throat last night and would like cath before next week.Cath scheduled tomorrow 09/28/14 at 12:00 noon arrive at 10:00 am with Dr.Kelly.Patient will come to office this afternoon to pick up instructions and have pre cath labs,cxr.Message sent to Dr.Jordan to put cath orders in.

## 2014-09-27 NOTE — Telephone Encounter (Signed)
Received a call from Cecilie Kicks NP she advised start patient on Imdur 30 mg daily.Cardiac cath scheduled 09/28/14 with Dr.Kelly.Advised if pain gets worse got to ER.

## 2014-09-27 NOTE — Addendum Note (Signed)
Addended by: Golden Hurter D on: 09/27/2014 01:25 PM   Modules accepted: Orders

## 2014-09-27 NOTE — Telephone Encounter (Signed)
Pt's primary cardiologist is Dr Martinique.  Pt had abnormal myoview on 09/25/14 but this has not been reviewed at this time.  I will forward this message to Elly Modena LPN to follow-up with MD/PA in regards to test results and contact the pt.

## 2014-09-27 NOTE — Telephone Encounter (Signed)
Pt would like to know if his stress test results are back from Tuesday? He says he is still having the same symptoms.

## 2014-09-28 ENCOUNTER — Encounter (HOSPITAL_COMMUNITY)
Admission: RE | Disposition: A | Discharge status: Home / Self Care | Payer: Self-pay | Source: Ambulatory Visit | Attending: Cardiology

## 2014-09-28 ENCOUNTER — Other Ambulatory Visit: Payer: Self-pay | Admitting: *Deleted

## 2014-09-28 ENCOUNTER — Encounter (HOSPITAL_COMMUNITY): Payer: Self-pay | Admitting: Surgical

## 2014-09-28 ENCOUNTER — Inpatient Hospital Stay (HOSPITAL_COMMUNITY)
Admission: RE | Admit: 2014-09-28 | Discharge: 2014-10-07 | DRG: 234 | Disposition: A | Discharge status: Home / Self Care | Payer: Medicare HMO | Source: Ambulatory Visit | Attending: Thoracic Surgery (Cardiothoracic Vascular Surgery) | Admitting: Thoracic Surgery (Cardiothoracic Vascular Surgery)

## 2014-09-28 DIAGNOSIS — D62 Acute posthemorrhagic anemia: Secondary | ICD-10-CM | POA: Diagnosis not present

## 2014-09-28 DIAGNOSIS — I252 Old myocardial infarction: Secondary | ICD-10-CM | POA: Diagnosis not present

## 2014-09-28 DIAGNOSIS — I2510 Atherosclerotic heart disease of native coronary artery without angina pectoris: Secondary | ICD-10-CM | POA: Diagnosis present

## 2014-09-28 DIAGNOSIS — Z7982 Long term (current) use of aspirin: Secondary | ICD-10-CM | POA: Diagnosis not present

## 2014-09-28 DIAGNOSIS — T82857A Stenosis of cardiac prosthetic devices, implants and grafts, initial encounter: Secondary | ICD-10-CM | POA: Diagnosis present

## 2014-09-28 DIAGNOSIS — E039 Hypothyroidism, unspecified: Secondary | ICD-10-CM | POA: Diagnosis present

## 2014-09-28 DIAGNOSIS — K219 Gastro-esophageal reflux disease without esophagitis: Secondary | ICD-10-CM | POA: Diagnosis present

## 2014-09-28 DIAGNOSIS — E7800 Pure hypercholesterolemia, unspecified: Secondary | ICD-10-CM | POA: Diagnosis present

## 2014-09-28 DIAGNOSIS — R079 Chest pain, unspecified: Secondary | ICD-10-CM | POA: Diagnosis present

## 2014-09-28 DIAGNOSIS — D696 Thrombocytopenia, unspecified: Secondary | ICD-10-CM | POA: Diagnosis present

## 2014-09-28 DIAGNOSIS — Z79899 Other long term (current) drug therapy: Secondary | ICD-10-CM

## 2014-09-28 DIAGNOSIS — F410 Panic disorder [episodic paroxysmal anxiety] without agoraphobia: Secondary | ICD-10-CM | POA: Diagnosis present

## 2014-09-28 DIAGNOSIS — Y712 Prosthetic and other implants, materials and accessory cardiovascular devices associated with adverse incidents: Secondary | ICD-10-CM | POA: Diagnosis present

## 2014-09-28 DIAGNOSIS — D6959 Other secondary thrombocytopenia: Secondary | ICD-10-CM | POA: Diagnosis not present

## 2014-09-28 DIAGNOSIS — Z951 Presence of aortocoronary bypass graft: Secondary | ICD-10-CM

## 2014-09-28 DIAGNOSIS — Z87891 Personal history of nicotine dependence: Secondary | ICD-10-CM | POA: Diagnosis not present

## 2014-09-28 DIAGNOSIS — E119 Type 2 diabetes mellitus without complications: Secondary | ICD-10-CM | POA: Diagnosis present

## 2014-09-28 DIAGNOSIS — I10 Essential (primary) hypertension: Secondary | ICD-10-CM | POA: Diagnosis present

## 2014-09-28 DIAGNOSIS — F329 Major depressive disorder, single episode, unspecified: Secondary | ICD-10-CM | POA: Diagnosis present

## 2014-09-28 DIAGNOSIS — E785 Hyperlipidemia, unspecified: Secondary | ICD-10-CM | POA: Diagnosis present

## 2014-09-28 DIAGNOSIS — I25110 Atherosclerotic heart disease of native coronary artery with unstable angina pectoris: Principal | ICD-10-CM | POA: Diagnosis present

## 2014-09-28 DIAGNOSIS — R740 Nonspecific elevation of levels of transaminase and lactic acid dehydrogenase [LDH]: Secondary | ICD-10-CM | POA: Diagnosis present

## 2014-09-28 DIAGNOSIS — Z8249 Family history of ischemic heart disease and other diseases of the circulatory system: Secondary | ICD-10-CM

## 2014-09-28 DIAGNOSIS — I200 Unstable angina: Secondary | ICD-10-CM | POA: Diagnosis present

## 2014-09-28 DIAGNOSIS — R7401 Elevation of levels of liver transaminase levels: Secondary | ICD-10-CM

## 2014-09-28 HISTORY — PX: LEFT HEART CATHETERIZATION WITH CORONARY ANGIOGRAM: SHX5451

## 2014-09-28 HISTORY — DX: Hypothyroidism, unspecified: E03.9

## 2014-09-28 HISTORY — DX: Basal cell carcinoma of skin of unspecified parts of face: C44.310

## 2014-09-28 LAB — SURGICAL PCR SCREEN
MRSA, PCR: NEGATIVE
Staphylococcus aureus: NEGATIVE

## 2014-09-28 LAB — BASIC METABOLIC PANEL
BUN: 13 mg/dL (ref 6–23)
CALCIUM: 9.3 mg/dL (ref 8.4–10.5)
CO2: 23 mEq/L (ref 19–32)
Chloride: 106 mEq/L (ref 96–112)
Creat: 0.86 mg/dL (ref 0.50–1.35)
Glucose, Bld: 114 mg/dL — ABNORMAL HIGH (ref 70–99)
Potassium: 4.2 mEq/L (ref 3.5–5.3)
SODIUM: 138 meq/L (ref 135–145)

## 2014-09-28 LAB — PROTIME-INR
INR: 1.05 (ref <1.50)
PROTHROMBIN TIME: 13.7 s (ref 11.6–15.2)

## 2014-09-28 SURGERY — LEFT HEART CATHETERIZATION WITH CORONARY ANGIOGRAM
Anesthesia: LOCAL

## 2014-09-28 MED ORDER — FENOFIBRATE 160 MG PO TABS
160.0000 mg | ORAL_TABLET | Freq: Every day | ORAL | Status: DC
  Administered 2014-09-29 – 2014-10-06 (×7): 160 mg via ORAL
  Filled 2014-09-28 (×9): qty 1

## 2014-09-28 MED ORDER — METFORMIN HCL 500 MG PO TABS
500.0000 mg | ORAL_TABLET | Freq: Two times a day (BID) | ORAL | Status: DC
  Filled 2014-09-28 (×4): qty 1

## 2014-09-28 MED ORDER — SODIUM CHLORIDE 0.9 % IV SOLN
INTRAVENOUS | Status: DC
  Administered 2014-09-28: 10:00:00 via INTRAVENOUS

## 2014-09-28 MED ORDER — ASPIRIN EC 81 MG PO TBEC
81.0000 mg | DELAYED_RELEASE_TABLET | Freq: Every day | ORAL | Status: DC
  Administered 2014-09-29 – 2014-10-02 (×4): 81 mg via ORAL
  Filled 2014-09-28 (×5): qty 1

## 2014-09-28 MED ORDER — SODIUM CHLORIDE 0.9 % IJ SOLN: 3.0000 mL | Freq: Two times a day (BID) | INTRAMUSCULAR | Status: DC

## 2014-09-28 MED ORDER — SODIUM CHLORIDE 0.9 % IV SOLN: 250.0000 mL | INTRAVENOUS | Status: DC | PRN

## 2014-09-28 MED ORDER — LIDOCAINE HCL (PF) 1 % IJ SOLN
INTRAMUSCULAR | Status: AC
  Filled 2014-09-28: qty 30

## 2014-09-28 MED ORDER — VERAPAMIL HCL 2.5 MG/ML IV SOLN
INTRAVENOUS | Status: AC
  Filled 2014-09-28: qty 2

## 2014-09-28 MED ORDER — RAMIPRIL 10 MG PO CAPS
10.0000 mg | ORAL_CAPSULE | Freq: Every day | ORAL | Status: DC
  Administered 2014-09-29 – 2014-10-02 (×4): 10 mg via ORAL
  Filled 2014-09-28 (×5): qty 1

## 2014-09-28 MED ORDER — ATENOLOL 25 MG PO TABS
25.0000 mg | ORAL_TABLET | Freq: Two times a day (BID) | ORAL | Status: DC
  Administered 2014-09-29 – 2014-10-02 (×4): 25 mg via ORAL
  Filled 2014-09-28 (×11): qty 1

## 2014-09-28 MED ORDER — ISOSORBIDE MONONITRATE ER 30 MG PO TB24
30.0000 mg | ORAL_TABLET | Freq: Every day | ORAL | Status: DC
  Administered 2014-09-29 – 2014-10-02 (×4): 30 mg via ORAL
  Filled 2014-09-28 (×5): qty 1

## 2014-09-28 MED ORDER — SODIUM CHLORIDE 0.9 % IV SOLN
INTRAVENOUS | Status: DC
  Administered 2014-09-28 (×2): via INTRAVENOUS

## 2014-09-28 MED ORDER — MIDAZOLAM HCL 2 MG/2ML IJ SOLN
INTRAMUSCULAR | Status: AC
  Filled 2014-09-28: qty 2

## 2014-09-28 MED ORDER — ASPIRIN 81 MG PO TABS: 81.0000 mg | ORAL_TABLET | Freq: Every day | ORAL | Status: DC

## 2014-09-28 MED ORDER — HEPARIN (PORCINE) IN NACL 2-0.9 UNIT/ML-% IJ SOLN
INTRAMUSCULAR | Status: AC
  Filled 2014-09-28: qty 1500

## 2014-09-28 MED ORDER — HEPARIN SODIUM (PORCINE) 1000 UNIT/ML IJ SOLN
INTRAMUSCULAR | Status: AC
  Filled 2014-09-28: qty 1

## 2014-09-28 MED ORDER — CHOLESTYRAMINE LIGHT 4 G PO PACK
4.0000 g | PACK | Freq: Two times a day (BID) | ORAL | Status: DC
  Administered 2014-09-29 – 2014-10-02 (×8): 4 g via ORAL
  Filled 2014-09-28 (×12): qty 1

## 2014-09-28 MED ORDER — HEPARIN (PORCINE) IN NACL 100-0.45 UNIT/ML-% IJ SOLN
1400.0000 [IU]/h | INTRAMUSCULAR | Status: DC
  Administered 2014-09-28: 1350 [IU]/h via INTRAVENOUS
  Administered 2014-09-29: 1600 [IU]/h via INTRAVENOUS
  Administered 2014-09-30: 1500 [IU]/h via INTRAVENOUS
  Administered 2014-10-01: 1350 [IU]/h via INTRAVENOUS
  Administered 2014-10-01: 1400 [IU]/h via INTRAVENOUS
  Filled 2014-09-28 (×9): qty 250

## 2014-09-28 MED ORDER — LEVOTHYROXINE SODIUM 100 MCG PO TABS
100.0000 ug | ORAL_TABLET | Freq: Every day | ORAL | Status: DC
  Administered 2014-09-29 – 2014-10-06 (×7): 100 ug via ORAL
  Filled 2014-09-28 (×11): qty 1

## 2014-09-28 MED ORDER — ALLOPURINOL 150 MG HALF TABLET
150.0000 mg | ORAL_TABLET | Freq: Every day | ORAL | Status: DC
  Administered 2014-09-29 – 2014-10-02 (×4): 150 mg via ORAL
  Filled 2014-09-28 (×5): qty 1

## 2014-09-28 MED ORDER — EZETIMIBE 10 MG PO TABS
10.0000 mg | ORAL_TABLET | Freq: Every day | ORAL | Status: DC
  Administered 2014-09-29 – 2014-10-06 (×7): 10 mg via ORAL
  Filled 2014-09-28 (×9): qty 1

## 2014-09-28 MED ORDER — ASPIRIN 81 MG PO CHEW: 81.0000 mg | CHEWABLE_TABLET | ORAL | Status: DC

## 2014-09-28 MED ORDER — FENTANYL CITRATE 0.05 MG/ML IJ SOLN
INTRAMUSCULAR | Status: AC
  Filled 2014-09-28: qty 2

## 2014-09-28 MED ORDER — NITROGLYCERIN 1 MG/10 ML FOR IR/CATH LAB
INTRA_ARTERIAL | Status: AC
  Filled 2014-09-28: qty 10

## 2014-09-28 MED ORDER — PAROXETINE HCL 20 MG PO TABS
20.0000 mg | ORAL_TABLET | Freq: Every day | ORAL | Status: DC
  Administered 2014-09-30 – 2014-10-06 (×6): 20 mg via ORAL
  Filled 2014-09-28 (×10): qty 1

## 2014-09-28 MED ORDER — SODIUM CHLORIDE 0.9 % IJ SOLN: 3.0000 mL | INTRAMUSCULAR | Status: DC | PRN

## 2014-09-28 MED ORDER — PANTOPRAZOLE SODIUM 40 MG PO TBEC
40.0000 mg | DELAYED_RELEASE_TABLET | Freq: Every day | ORAL | Status: DC
  Administered 2014-09-28 – 2014-10-02 (×5): 40 mg via ORAL
  Filled 2014-09-28 (×5): qty 1

## 2014-09-28 MED ORDER — ONDANSETRON HCL 4 MG/2ML IJ SOLN: 4.0000 mg | Freq: Four times a day (QID) | INTRAMUSCULAR | Status: DC | PRN

## 2014-09-28 MED ORDER — ACETAMINOPHEN 325 MG PO TABS
650.0000 mg | ORAL_TABLET | ORAL | Status: DC | PRN
Start: 2014-09-28 — End: 2014-10-03
  Administered 2014-09-29 – 2014-10-02 (×8): 650 mg via ORAL
  Filled 2014-09-28 (×7): qty 2

## 2014-09-28 NOTE — Consult Note (Addendum)
[address]       [place] [address]             [phone]        Lendon Worthing Lynn Medical Record #[id number] Date of Birth: 20-Jan-1946  Referring: No ref. provider found Primary Care: Coy Saunas, MD  Chief Complaint: Multivessel CAD History of Present Illness:    68 year old male with a history od CAD s/p stenting of left Circumflex in 1996 and RCA in 2002. Recently he was see by PCP with symptoms of chest pain similar to previous pain when stents were placed. He was treated initially with PPI sith some improvement. He was also due for exercise myoview and this was performed and findings were suggestive of ischemia. Cardiac catheterization was therefore performed and findings include severe multivessel CAD . We are consulted for surgical opinion for consideration of CABG.    Current Activity/ Functional Status: Patient is independent with mobility/ambulation, transfers, ADL's, IADL's.   Zubrod Score: At the time of surgery this patient's most appropriate activity status/level should be described as: [x]     0    Normal activity, no symptoms []     1    Restricted in physical strenuous activity but ambulatory, able to do out light work []     2    Ambulatory and capable of self care, unable to do work activities, up and about                 more than 50%  Of the time                            []     3    Only limited self care, in bed greater than 50% of waking hours []     4    Completely disabled, no self care, confined to bed or chair []     5    Moribund  Past Medical History  Diagnosis Date  . Hypercholesterolemia   . Coronary disease     Status post stenting of the left circumflex coronary in 1996--status post stenting of the right coronary in 2002  . Hypertension     Controlled  . Diabetes mellitus     Type 2  . Hemochromatosis   . Obesity   . History of ulcer disease   . Panic disorder     Hx of panic disorder  . Cervical disc  disease     History of Cervical Disc Disease  . Myocardial infarction 2002  hypothyroidism  Past Surgical History  Procedure Laterality Date  . 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 Former Smoker  . Start date: 09/20/1995  Smokeless tobacco  . Not on file    History  Alcohol Use  . Yes    History   Social History  . Marital Status: Married    Spouse Name: N/A    Number of Children: 1  . Years of Education: N/A   Occupational History  . inspector [employer]    Social History Main Topics  . Smoking status: Former Smoker    Start date: 09/20/1995  . Smokeless tobacco: Not on file  . Alcohol Use: Yes  . Drug Use: Not on  file  . Sexual Activity: Not on file   Other Topics Concern  . Not on file   Social History Narrative    Allergies  Allergen Reactions  . Statins Other (See Comments)    "Affects liver enzymes"    Current Facility-Administered Medications  Medication Dose Route Frequency Provider Last Rate Last Dose  . 0.9 %  sodium chloride infusion   Intravenous Continuous Troy Sine, MD 150 mL/hr at 09/28/14 1600 150 mL/hr at 09/28/14 1600  . acetaminophen (TYLENOL) tablet 650 mg  650 mg Oral Q4H PRN Troy Sine, MD      . Derrill Memo ON 09/29/2014] allopurinol (ZYLOPRIM) tablet 150 mg  150 mg Oral Daily Troy Sine, MD      . Derrill Memo ON 09/29/2014] aspirin EC tablet 81 mg  81 mg Oral Daily Troy Sine, MD      . atenolol (TENORMIN) tablet 25 mg  25 mg Oral BID Troy Sine, MD      . cholestyramine light (PREVALITE) packet 4 g  4 g Oral BID Troy Sine, MD      . Derrill Memo ON 09/29/2014] ezetimibe (ZETIA) tablet 10 mg  10 mg Oral Daily Troy Sine, MD      . Derrill Memo ON 09/29/2014] fenofibrate tablet 160 mg  160 mg Oral Daily Troy Sine, MD      . heparin ADULT infusion 100  units/mL (25000 units/250 mL)  1,350 Units/hr Intravenous Continuous Lauren Bajbus, RPH      . [START ON 09/29/2014] isosorbide mononitrate (IMDUR) 24 hr tablet 30 mg  30 mg Oral Daily Troy Sine, MD      . Derrill Memo ON 09/29/2014] levothyroxine (SYNTHROID, LEVOTHROID) tablet 100 mcg  100 mcg Oral QAC breakfast Troy Sine, MD      . metFORMIN (GLUCOPHAGE) tablet 500 mg  500 mg Oral BID WC Troy Sine, MD      . ondansetron [facility]) injection 4 mg  4 mg Intravenous Q6H PRN Troy Sine, MD      . pantoprazole (PROTONIX) EC tablet 40 mg  40 mg Oral Daily Troy Sine, MD      . PARoxetine (PAXIL) tablet 20 mg  20 mg Oral Daily Troy Sine, MD      . Derrill Memo ON 09/29/2014] ramipril (ALTACE) capsule 10 mg  10 mg Oral Daily Troy Sine, MD        Prescriptions prior to admission  Medication Sig Dispense Refill Last Dose  . allopurinol (ZYLOPRIM) 300 MG tablet Take 150 mg by mouth daily.    09/28/2014 at Unknown time  . aspirin 81 MG tablet Take 81 mg by mouth daily.     09/28/2014 at Unknown time  . atenolol (TENORMIN) 25 MG tablet Take 25 mg by mouth 2 (two) times daily.    09/28/2014 at Unknown time  . cholestyramine light (PREVALITE) 4 G packet Take 1 packet (4 g total) by mouth 2 (two) times daily.   09/26/2014 at Unknown time  . ezetimibe (ZETIA) 10 MG tablet Take 10 mg by mouth daily.     09/28/2014 at Unknown time  . fenofibrate (TRICOR) 145 MG tablet Take 1 tablet (145 mg total) by mouth daily.   09/28/2014 at Unknown time  . FOLIC ACID PO Take 245 mg by mouth daily.    09/28/2014 at Unknown time  . isosorbide mononitrate (IMDUR) 30 MG 24 hr tablet Take 1 tablet (30 mg total) by mouth daily.  30 tablet 6   . levothyroxine (SYNTHROID, LEVOTHROID) 100 MCG tablet Take 100 mcg by mouth daily.     09/28/2014 at Unknown time  . metFORMIN (GLUCOPHAGE) 500 MG tablet Take 500 mg by mouth 2 (two) times daily with a meal.    09/27/2014 at Unknown time  . pantoprazole (PROTONIX) 40 MG  tablet Take 1 tablet (40 mg total) by mouth daily. 30 tablet 11 09/27/2014 at Unknown time  . PARoxetine (PAXIL) 20 MG tablet Take 20 mg by mouth daily.     09/26/2014 at Unknown time  . ramipril (ALTACE) 10 MG capsule Take 10 mg by mouth daily.     09/28/2014 at Unknown time    Family History  Problem Relation Age of Onset  . Heart attack Sister   . Cancer Mother     Colon Cancer  . Heart failure Mother   . Cancer Brother     Colon Cancer     Review of Systems:     Cardiac Review of Systems: Y or N  Chest Pain [  y  ]  Resting SOB [ n  ] Exertional SOB  [n  ]  Orthopnea [n  ]   Pedal Edema [n   ]    Palpitations Florencio.Farrier  ] Syncope  Florencio.Farrier  ]   Prensyncope Florencio.Farrier   ]  General Review of Systems: [Y] = yes [  ]=no Constitional: recent weight change Florencio.Farrier  ]; anorexia [ n ]; fatigue [ n ]; nausea [ n ]; night sweats [n  ]; fever [ n ]; or chills [n  ]                                                               Dental: poor dentition[n  ]; Last Dentist visit: this year  Eye : blurred vision [n  ]; diplopia [ n  ]; vision changes [n  ];  Amaurosis fugax[n  ]; Resp: cough [n  ];  wheezing[n  ];  hemoptysis[n  ]; shortness of breath[ n ]; paroxysmal nocturnal dyspnea[n  ]; dyspnea on exertion[n  ]; or orthopnea[n  ];  GI:  gallstones[n  ], vomiting[ n ];  dysphagia[n  ]; melena[ n ];  hematochezia [n  ]; heartburn[y  ];   Hx of  Colonoscopy[ y ]; GU: kidney stones [n  ]; hematuria[n  ];   dysuria [ n ];  nocturia[ y ];  history of     obstruction [n  ]; urinary frequency [ n ]             Skin: rash, swelling[ n ];, hair loss[n  ];  peripheral edema[n  ];  or itching[ n ]; Musculosketetal: myalgias[ n ];  joint swelling[n  ];  joint erythema[n  ];  joint pain[n  ];  back pain[ y ];  Heme/Lymph: bruising[n  ];  bleeding[ n ];  anemia[ n ];  Neuro: TIA[ n ];  headaches[ n ];  stroke[ n ];  vertigo[  ];  seizures[n  ];   paresthesias[ n ];  difficulty walking[n  ];  Psych:depression[ n ]; anxiety[n  ];+  insomnia  Endocrine: diabetes[y ];  thyroid dysfunction[y  ];  Immunizations: Flu [  ]; Pneumococcal[  ];  Other:  Physical  Exam: BP 159/135 mmHg  Pulse 57  Resp 14  SpO2 100%  General appearance: alert, cooperative and no distress Neurologic: intact Heart: regular rate and rhythm, S1, S2 normal, no murmur, click, rub or gallop Lungs: clear to auscultation bilaterally Abdomen: soft, non-tender; bowel sounds normal; no masses,  no organomegaly Extremities: extremities normal, atraumatic, no cyanosis or edema HEENT: PEERL, EOMI, ANICT, PHARYNX CLEAR NECK : Supple, no bruits, no thyromegally, noo lymphadenopathy Pulses: intact Shin: no rashes or lesions  Diagnostic Studies & Laboratory data:     Recent Radiology Findings:   Dg Chest 2 View  09/28/2014   CLINICAL DATA:  Pain above the manubrium for 2 weeks. The patient is pre cardiac catheterization.  EXAM: CHEST  2 VIEW  COMPARISON:  October 23, 2008  FINDINGS: The heart size and mediastinal contours are within normal limits. There is no focal infiltrate, pulmonary edema, or pleural effusion. The visualized skeletal structures are unremarkable.  IMPRESSION: No active cardiopulmonary disease.   Electronically Signed   By: Abelardo Diesel M.D.   On: 09/28/2014 14:14      DATE OF PROCEDURE: 09/28/2014     CARDIAC CATHETERIZATION    HISTORY:   Mr. Jalik Gellatly is a 68 year old patient of Dr. Daleyza Gadomski [place] who is status post stenting of his left circumflex coronary artery in 1996 and right coronary artery in 2002. He has a total of 3 stents. He has a history of significant statin intolerance, elevated CPK levels on Lipitor. He has been unsteady a with continued high lipid studies. He recently had noticed some episodes of chest discomfort which he felt was due to reflux. The patient was referred for a three-year follow-up nuclear perfusion study was abnormal and revealed a moderate size and intensity, reversible inferior defect  compatible with ischemia. He developed 2.5 mm horizontal ST segment depression inferolaterally. He now is referred for definitive repeat cardiac catheterization.     PROCEDURE: Left heart catheterization via the right radial artery: Coronary angiography, left ventriculography.  The patient was brought to the second floor [facility] cath lab in the postabsorptive state. The patient was premedicated with Versed 2 mg and fentanyl 50 mcg. A right radial approach was utilized after an Allen's test verified adequate circulation. The right radial artery was punctured via the Seldinger technique, and a 6 [place] Glidesheath Slender was inserted without difficulty. A radial cocktail consisting of Verapamil, IV nitroglycerin, and lidocaine was administered. Weight adjusted heparin was administered. A safety J wire was advanced into the ascending aorta. Diagnostic catheterization was done with 5 French TIG 4.0, FL3.5, ERADL and ultimately EBU 3.5 catheters. A 5 French pigtail catheter was used for left ventriculography. A TR radial band was applied for hemostasis. The patient left the catheterization laboratory in stable condition.   HEMODYNAMICS:   Central Aorta: 135/88  Left Ventricle: 135/14  ANGIOGRAPHY:   Fluoroscopy revealed significant coronary calcification of all vessels.  There was significant calcification at the origin and proximal Left Main coronary artery with a 60-70% ostial narrowing. The left main bifurcated into the LAD and left circumflex vessel..   The LAD was a moderate size vessel that had 50% proximal stenosis in the region of the first diagonal vessel and another region of at least 50% stenosis in its midsegment. The LAD extended to the LV apex.   The left circumflex coronary  artery was a moderate size vessel. There appeared to be a stent in its midsegment. In the region proximal to the first marginal  branch. There was mild narrowing of 40% in this segment.   The RCA was stents of the calcified. There was significant ostial calcification and evidence for a 95% ostial calcified stenosis. Beyond this in the proximal segment was an area of haziness with 90% stenosis in the region of calcification with possible thrombus. There appeared to be admitted RCA stent that had narrowing of 40%. There also appeared to be a distal RCA stent proximal to the PDA takeoff which was patent. The RCA was a dominant vessel.   Left ventriculography revealed preserved global LV contractility with suggestion of mid to basal mild inferior hypocontractility. Ejection fraction is 55%.    Total contrast used: 135 cc Omipaque   IMPRESSION:  Severe coronary calcification and multivessel CAD with 60-70% ostial left main stenosis; segmental 50% proximal and mid LAD stenoses; 40% narrowing within the stented segment in the left circumflex coronary artery; and significant ostial RCA calcification with 95% ostial stenosis followed by a 90% proximal stenosis. An area of calcification with probable thrombus, and 40% mid RCA stenosis and a previously placed stent with a patent distal RCA stent.  Normal LV contractility with an ejection fraction of 55% and evidence for mild mid to basal inferior hypocontractility.   RECOMMENDATION:  Surgical consultation for consideration of CABG revascularization surgery.   Troy Sine, MD, [facility] 09/28/2014 3:40 PM  Recent Lab Findings: Lab Results  Component Value Date   WBC 4.7 09/27/2014   HGB 14.2 09/27/2014   HCT 40.9 09/27/2014   PLT 166 09/27/2014   GLUCOSE 114* 09/27/2014   NA 138 09/27/2014   K 4.2 09/27/2014   CL 106 09/27/2014   CREATININE 0.86 09/27/2014   BUN 13 09/27/2014   CO2 23 09/27/2014   INR 1.05 09/27/2014       Assessment / Plan:  Severe multivessel CAD, recommend CABG    GOLD,WAYNE E, PA-C   Patient examined, coronary angiogram and 2-D echocardiogram reviewed. Patient with history of coronary stents placed> 10 years ago with recurrent anginal symptoms occurring with activity. After a stress test he underwent catheterization which demonstrated significant progression of disease--95% ostial RCA stenosis,, ostial left main 75% stenosis with 50% in-stent stenosis of the LAD stent. LV function is well-preserved. Patient in sinus rhythm. Chest x-rays clear. Carotid Dopplers have been completed and results are pending.  The patient has good peripheral pulses and lungs are clear to auscultation  The patient is scheduled for multivessel CABG later this week-tentatively scheduled for Wednesday a.m. with Dr. Roxan Hockey. Agree with plan to leave the patient hospitalized on IV heparin due to the RCA thrombus and left main stenosis. I discussed the procedure-plan for surgery with the patient and he is in agreement Ivin Poot  MD

## 2014-09-28 NOTE — Care Management Note (Addendum)
    Page 1 of 1   10/01/2014     9:17:09 AM CARE MANAGEMENT NOTE 10/01/2014  Patient:  Miguel Vasquez,Miguel Vasquez   Account Number:  [id number]  Date Initiated:  09/28/2014  Documentation initiated by:  Elissa Hefty  Subjective/Objective Assessment:   adm w pos card cath,angina     Action/Plan:   lives w wife, pcp dr Jeneen Rinks davis   Anticipated DC Date:  10/02/2014   Anticipated DC Plan:  HOME/SELF CARE         Choice offered to / List presented to:             Status of service:   Medicare Important Message given?  YES (If response is "NO", the following Medicare IM given date fields will be blank) Date Medicare IM given:  10/01/2014 Medicare IM given by:  Elissa Hefty Date Additional Medicare IM given:   Additional Medicare IM given by:    Discharge Disposition:    Per UR Regulation:  Reviewed for med. necessity/level of care/duration of stay  If discussed at [place] of Stay Meetings, dates discussed:    Comments:

## 2014-09-28 NOTE — Progress Notes (Signed)
Pt scheduled by Dr. Martinique for cath following abnormal nuclear perfusion study. The risks and benefits of a cardiac catheterization including, but not limited to, death, stroke, MI, kidney damage and bleeding were discussed with the patient who indicates understanding and agrees to proceed.  Plan cath with possible PCI if indicated. Miguel Sine, MD  09/28/2014  1:34 PM

## 2014-09-28 NOTE — Progress Notes (Addendum)
ANTICOAGULATION CONSULT NOTE - Initial Consult  Pharmacy Consult for heparin Indication: chest pain/ACS  Allergies  Allergen Reactions  . Statins Other (See Comments)    "Affects liver enzymes"    Patient Measurements: Height: 6'2" Weight: 101.2kg Heparin Dosing Weight: 101kg  Vital Signs: BP: 159/135 mmHg (11/20 1545) Pulse Rate: 57 (11/20 1545)  Labs:  Recent Labs  09/27/14 1439  HGB 14.2  HCT 40.9  PLT 166  LABPROT 13.7  INR 1.05  CREATININE 0.86    Estimated Creatinine Clearance: 108.8 mL/min (by C-G formula based on Cr of 0.86).   Medical History: Past Medical History  Diagnosis Date  . Hypercholesterolemia   . Coronary disease     Status post stenting of the left circumflex coronary in 1996--status post stenting of the right coronary in 2002  . Hypertension     Controlled  . Diabetes mellitus     Type 2  . Hemochromatosis   . Obesity   . History of ulcer disease   . Panic disorder     Hx of panic disorder  . Cervical disc disease     History of Cervical Disc Disease  . Myocardial infarction 2002    Assessment: 74 YOM s/p abnormal nuclear study, taken to cath. Cath revealed significant calcification of all vessels- surgical consult in placed for consideration of CABG. To start heparin 8 hours post-sheath removal. Sheath removed at 1441.  Was not on heparin prior to cath, so do not have levels on which to base starting dose.  Yesterday's lab work reveals Hgb 14.2, plts 166. INR 1.05. No overt bleeding noted post-cath. Patient was not on anticoagulation PTA.  Goal of Therapy:  Heparin level 0.3-0.7 units/ml Monitor platelets by anticoagulation protocol: Yes   Plan:  1. Start heparin with NO BOLUS at 1350 units/hr at 2300 tonight 2. First heparin level with AM labs 3. Daily heparin level and CBC 4. Follow for s/s bleeding and CABG plans  Marigold Mom D. Rilya Longo, PharmD, BCPS Clinical Pharmacist Pager: [phone] 09/28/2014 3:58 PM

## 2014-09-28 NOTE — H&P (View-Only) (Signed)
09/20/2014   PCP: Coy Saunas, MD   Chief Complaint  Patient presents with  . Follow-up    has appt with Dr.Jordan 11/30. had been having discomfort in chest PCP determined it was Acid Reflux. Dr.Jordan had metioned a stress test, patient wants stress test to make sure it is reflux    Primary Cardiologist:Dr. P. Martinique   HPI:  68 year old male with a history of coronary disease and is status post stenting of the left circumflex coronary in 1996. He had stenting of the right coronary in 2002. His last Myoview study was in November of 2012. He is currently off all statins because of muscle aches. He did have elevated CPK levels noted with Lipitor. He remains on Zetia. Was recently started on cholestyramine and fenofibrate.[name] LDL is now 145. Triglycerides are 357, tot. Chol is 257.  He has been watching his diet much closer and has lost 24 pounds but has gained back some. He is feeling well today and remains active.  Recently chest discomfort and seen by PCP.   Thought it was PPI symptoms and  seems better on Protonix.  Pt concerned about his heart- on last visit Dr. P. [place] had recommended stress test this year.  The pt feels the discomfort similar to when he had stent placed.     Allergies  Allergen Reactions  . Statins Other (See Comments)    "Affects liver enzymes"    Current Outpatient Prescriptions  Medication Sig Dispense Refill  . allopurinol (ZYLOPRIM) 300 MG tablet Take 150 mg by mouth daily.     [name] aspirin 81 MG tablet Take 81 mg by mouth daily.      [name] atenolol (TENORMIN) 25 MG tablet Take 25 mg by mouth 2 (two) times daily.     [name] ezetimibe (ZETIA) 10 MG tablet Take 10 mg by mouth daily.      [name] FOLIC ACID PO Take by mouth daily.      [name] levothyroxine (SYNTHROID, LEVOTHROID) 100 MCG tablet Take 100 mcg by mouth daily.      . metFORMIN (GLUCOPHAGE) 500 MG tablet Take 500 mg by mouth 2 (two) times daily with a meal.     . OVER THE COUNTER MEDICATION  Cholestyram    . PARoxetine (PAXIL) 20 MG tablet Take 20 mg by mouth daily.      . ramipril (ALTACE) 10 MG capsule Take 10 mg by mouth daily.       No current facility-administered medications for this visit.    MEDS HAVE BEEN ADDED THAT WERE NOT ON MED LIST,  CHOLESTYRAMINE, FENFIBRATE 145 MG AND PROTONIX.   Past Medical History  Diagnosis Date  . Hypercholesterolemia   . Coronary disease     Status post stenting of the left circumflex coronary in 1996--status post stenting of the right coronary in 2002  . Hypertension     Controlled  . Diabetes mellitus     Type 2  . Hemochromatosis   . Obesity   . History of ulcer disease   . Panic disorder     Hx of panic disorder  . Cervical disc disease     History of Cervical Disc Disease  . Myocardial infarction 2002    Past Surgical History  Procedure Laterality Date  . Coronary stent placement      x3    HYI:FOYDXAJ:OI colds or fevers, no weight changes Skin:no rashes or ulcers HEENT:no blurred vision, no congestion  CV:see HPI PUL:see HPI GI:no diarrhea constipation or melena, + indigestion if that was cause of pain. GU:no hematuria, no dysuria MS:no joint pain, no claudication Neuro:no syncope, no lightheadedness Endo:+ diabetes, + thyroid disease  Wt Readings from Last 3 Encounters:  09/20/14 223 lb 14.4 oz (101.56 kg)  10/02/13 214 lb 12.8 oz (97.433 kg)  09/19/12 237 lb (107.502 kg)    PHYSICAL EXAM BP 130/88 mmHg  Pulse 58  Ht 6\' 2"  (1.88 m)  Wt 223 lb 14.4 oz (101.56 kg)  BMI 28.73 kg/m2 General:Pleasant affect, NAD Skin:Warm and dry, brisk capillary refill HEENT:normocephalic, sclera clear, mucus membranes moist Neck:supple, no JVD, no bruits  Heart:S1S2 RRR without murmur, gallup, rub or click Lungs:clear without rales, rhonchi, or wheezes PYP:PJKD, non tender, + BS, do not palpate liver spleen or masses Ext:no lower ext edema, 2+ pedal pulses, 2+ radial pulses Neuro:alert and oriented, MAE, follows  commands, + facial symmetry  EKG:SR rate higher than last year but otherwise no changes.  ASSESSMENT AND PLAN Chest pain Has had recent chest pain, though does seem improved with protonix.  He is due for exercise myoview, 3 years since last with hx of CAD.  Will proceed with exercise myoview.  He will then keep appt with Dr. Mamie Nick. [place].  Coronary disease Status post stenting of the left circumflex coronary in 1996--status post stenting of the right coronary in 2002  for exercise myoview.  Follow up with Dr. P. [place].  Hypertension controlled  Hypercholesterolemia Followed by PCP, but improved from last year, intolerant to statins.

## 2014-09-28 NOTE — Interval H&P Note (Signed)
Cath Lab Visit (complete for each Cath Lab visit)  Clinical Evaluation Leading to the Procedure:   ACS: No.  Non-ACS:    Anginal Classification: CCS II  Anti-ischemic medical therapy: Maximal Therapy (2 or more classes of medications)  Non-Invasive Test Results: Intermediate-risk stress test findings: cardiac mortality 1-3%/year  Prior CABG: No previous CABG      History and Physical Interval Note:  09/28/2014 1:37 PM  Eduard Clos Huelsmann  has presented today for surgery, with the diagnosis of abnormal mioview/cad  The various methods of treatment have been discussed with the patient and family. After consideration of risks, benefits and other options for treatment, the patient has consented to  Procedure(s): LEFT HEART CATHETERIZATION WITH CORONARY ANGIOGRAM (N/A) as a surgical intervention .  The patient's history has been reviewed, patient examined, no change in status, stable for surgery.  I have reviewed the patient's chart and labs.  Questions were answered to the patient's satisfaction.     Leevi Cullars A

## 2014-09-28 NOTE — CV Procedure (Signed)
Miguel Vasquez is a 68 y.o. male   [id number]  [id number] LOCATION:  FACILITY: [place]  PHYSICIAN: Miguel Sine, MD, [facility] 1946-06-02   DATE OF PROCEDURE:  09/28/2014     CARDIAC CATHETERIZATION    HISTORY:    [name] is a 68 year old patient of Dr. Peter Vasquez who is status post stenting of his left circumflex coronary artery in 1996 and right coronary artery in 2002.  He has a total of 3 stents.  He has a history of significant statin intolerance, elevated CPK levels on Lipitor.  He has been unsteady a with continued high lipid studies.  He recently had noticed some episodes of chest discomfort which he felt was due to reflux. The patient was referred for a three-year follow-up nuclear perfusion study  was abnormal and revealed a moderate size and intensity, reversible inferior defect compatible with ischemia.  He developed 2.5 mm horizontal ST segment depression inferolaterally.  He now is referred for definitive repeat cardiac catheterization.                                                                                                                                                                                                   PROCEDURE:  Left heart catheterization via the right radial artery: Coronary angiography, left ventriculography.  The patient was brought to the second floor [facility] cath lab in the postabsorptive state. The patient was premedicated with Versed 2 mg and fentanyl 50 mcg. A right radial approach was utilized after an Allen's test verified adequate circulation. The right radial artery was punctured via the Seldinger technique, and a 6 [place] Glidesheath Slender was inserted without difficulty.  A radial cocktail consisting of Verapamil, IV nitroglycerin, and lidocaine was administered. Weight adjusted heparin was administered. A safety J wire was advanced into the ascending aorta. Diagnostic catheterization was done with 5 French TIG 4.0, FL3.5,  ERADL and ultimately EBU 3.5 catheters. A 5 French pigtail catheter was used for left ventriculography. A TR radial band was applied for hemostasis. The patient left the catheterization laboratory in stable condition.   HEMODYNAMICS:   Central Aorta: 135/88  Left Ventricle: 135/14  ANGIOGRAPHY:   Fluoroscopy revealed significant coronary calcification of all vessels.  There was significant calcification at the origin and proximal Left Main coronary artery with a 60-70% ostial narrowing.  The left main bifurcated into the LAD and left circumflex vessel..   The LAD was a moderate size vessel that had 50% proximal stenosis in the region of the first diagonal vessel and another region of at least 50% stenosis in  its midsegment.  The LAD extended to the LV apex.    The left circumflex coronary artery was a moderate size vessel.  There appeared to be a stent in its midsegment.  In the region proximal to the first marginal branch.  There was mild narrowing of 40% in this segment.   The RCA was  stents of the calcified.  There was significant ostial calcification and evidence for a 95% ostial calcified stenosis.  Beyond this in the proximal segment was an area of haziness with 90% stenosis in the region of calcification with possible thrombus.  There appeared to be admitted RCA stent that had narrowing of 40%.  There also appeared to be a distal RCA stent proximal to the PDA takeoff which was patent.  The RCA was a dominant vessel.    Left ventriculography revealed preserved global LV contractility  with suggestion of mid to basal mild inferior hypocontractility.  Ejection fraction is 55%.    Total contrast used: 135 cc Omipaque   IMPRESSION:  Severe coronary calcification and multivessel CAD with 60-70% ostial left main stenosis; segmental 50% proximal and mid LAD stenoses; 40% narrowing within the stented segment in the left circumflex coronary artery; and significant ostial RCA calcification  with 95% ostial stenosis followed by a 90% proximal stenosis.  An area of calcification with probable thrombus, and 40% mid RCA stenosis and a previously placed stent with a patent distal RCA stent.  Normal LV contractility with an ejection fraction of 55% and evidence for mild mid to basal inferior hypocontractility.   RECOMMENDATION:  Surgical consultation for consideration of CABG revascularization surgery.   Miguel Sine, MD, [facility] 09/28/2014 3:40 PM

## 2014-09-29 DIAGNOSIS — E119 Type 2 diabetes mellitus without complications: Secondary | ICD-10-CM

## 2014-09-29 DIAGNOSIS — I10 Essential (primary) hypertension: Secondary | ICD-10-CM

## 2014-09-29 DIAGNOSIS — I200 Unstable angina: Secondary | ICD-10-CM

## 2014-09-29 DIAGNOSIS — I25110 Atherosclerotic heart disease of native coronary artery with unstable angina pectoris: Secondary | ICD-10-CM

## 2014-09-29 DIAGNOSIS — Z01810 Encounter for preprocedural cardiovascular examination: Secondary | ICD-10-CM

## 2014-09-29 DIAGNOSIS — E780 Pure hypercholesterolemia: Secondary | ICD-10-CM

## 2014-09-29 LAB — URINALYSIS, ROUTINE W REFLEX MICROSCOPIC
Bilirubin Urine: NEGATIVE
Glucose, UA: NEGATIVE mg/dL
HGB URINE DIPSTICK: NEGATIVE
Ketones, ur: NEGATIVE mg/dL
Leukocytes, UA: NEGATIVE
Nitrite: NEGATIVE
PH: 5.5 (ref 5.0–8.0)
Protein, ur: NEGATIVE mg/dL
Specific Gravity, Urine: 1.011 (ref 1.005–1.030)
Urobilinogen, UA: 0.2 mg/dL (ref 0.0–1.0)

## 2014-09-29 LAB — CBC
HEMATOCRIT: 39.9 % (ref 39.0–52.0)
HEMOGLOBIN: 13 g/dL (ref 13.0–17.0)
MCH: 30.3 pg (ref 26.0–34.0)
MCHC: 32.6 g/dL (ref 30.0–36.0)
MCV: 93 fL (ref 78.0–100.0)
Platelets: 127 10*3/uL — ABNORMAL LOW (ref 150–400)
RBC: 4.29 MIL/uL (ref 4.22–5.81)
RDW: 12.5 % (ref 11.5–15.5)
WBC: 4 10*3/uL (ref 4.0–10.5)

## 2014-09-29 LAB — HEPARIN LEVEL (UNFRACTIONATED)
HEPARIN UNFRACTIONATED: 0.35 [IU]/mL (ref 0.30–0.70)
Heparin Unfractionated: 0.24 IU/mL — ABNORMAL LOW (ref 0.30–0.70)

## 2014-09-29 LAB — GLUCOSE, CAPILLARY
GLUCOSE-CAPILLARY: 135 mg/dL — AB (ref 70–99)
Glucose-Capillary: 200 mg/dL — ABNORMAL HIGH (ref 70–99)
Glucose-Capillary: 129 mg/dL — ABNORMAL HIGH (ref 70–99)

## 2014-09-29 NOTE — Progress Notes (Addendum)
Cardiologist: Dr. Martinique  Subjective:  No CP, no SOB, feels well. Waiting for CABG. Had angina (mid neck, throat) 2 weeks ago. Prior PCI's.   Objective:  Vital Signs in the last 24 hours: Temp:  [97.4 F (36.3 C)-98.3 F (36.8 C)] 97.4 F (36.3 C) (11/21 0812) Pulse Rate:  [47-74] 54 (11/21 0812) Resp:  [9-23] 16 (11/21 0812) BP: (115-176)/(75-135) 157/95 mmHg (11/21 0812) SpO2:  [96 %-100 %] 98 % (11/21 0812) Weight:  [220 lb (99.791 kg)] 220 lb (99.791 kg) (11/20 0924)  Intake/Output from previous day: 11/20 0701 - 11/21 0700 In: 1635 [P.O.:360; I.V.:1275] Out: 1875 [Urine:1875]   Physical Exam: General: Well developed, well nourished, in no acute distress. Head:  Normocephalic and atraumatic. Lungs: Clear to auscultation and percussion. Heart: Normal S1 and S2.  No murmur, rubs or gallops.  Abdomen: soft, non-tender, positive bowel sounds. Extremities: No clubbing or cyanosis. No edema. Cath site intact Neurologic: Alert and oriented x 3.    Lab Results:  Recent Labs  09/27/14 1439 09/29/14 0310  WBC 4.7 4.0  HGB 14.2 13.0  PLT 166 127*    Recent Labs  09/27/14 1439  NA 138  K 4.2  CL 106  CO2 23  GLUCOSE 114*  BUN 13  CREATININE 0.86    Imaging: Dg Chest 2 View  09/28/2014   CLINICAL DATA:  Pain above the manubrium for 2 weeks. The patient is pre cardiac catheterization.  EXAM: CHEST  2 VIEW  COMPARISON:  October 23, 2008  FINDINGS: The heart size and mediastinal contours are within normal limits. There is no focal infiltrate, pulmonary edema, or pleural effusion. The visualized skeletal structures are unremarkable.  IMPRESSION: No active cardiopulmonary disease.   Electronically Signed   By: Abelardo Diesel M.D.   On: 09/28/2014 14:14    Telemetry: No adverse rhythms Personally viewed.   EKG:  09/20/14: SB 58 no ST changes  Cardiac Studies:  ECHO pending  Scheduled Meds: . allopurinol  150 mg Oral Daily  . aspirin EC  81 mg Oral Daily  .  atenolol  25 mg Oral BID  . cholestyramine light  4 g Oral BID  . ezetimibe  10 mg Oral Daily  . fenofibrate  160 mg Oral Daily  . isosorbide mononitrate  30 mg Oral Daily  . levothyroxine  100 mcg Oral QAC breakfast  . pantoprazole  40 mg Oral Daily  . PARoxetine  20 mg Oral Daily  . ramipril  10 mg Oral Daily   Continuous Infusions: . sodium chloride 20 mL/hr (09/29/14 0528)  . heparin 1,500 Units/hr (09/29/14 0528)   PRN Meds:.acetaminophen, ondansetron (ZOFRAN) IV  Assessment/Plan:  Active Problems:   Hypercholesterolemia   Coronary disease   Hypertension   Diabetes mellitus   Unstable angina  68 year old patient of Dr. Peter Martinique who is status post stenting of his left circumflex coronary artery in 1996 and right coronary artery in 2002. He has a total of 3 stents. He has a history of significant statin intolerance, elevated CPK levels on Lipitor. He has been unsteady a with continued high lipid studies. He recently had noticed some episodes of chest discomfort which he felt was due to reflux. The patient was referred for a three-year follow-up nuclear perfusion study was abnormal and revealed a moderate size and intensity, reversible inferior defect compatible with ischemia. He developed 2.5 mm horizontal ST segment depression inferolaterally.   Cath on 09/28/14: Severe coronary calcification and multivessel CAD with 60-70% ostial left  main stenosis; segmental 50% proximal and mid LAD stenoses; 40% narrowing within the stented segment in the left circumflex coronary artery; and significant ostial RCA calcification with 95% ostial stenosis followed by a 90% proximal stenosis. An area of calcification with probable thrombus, and 40% mid RCA stenosis and a previously placed stent with a patent distal RCA stent.  Normal LV contractility with an ejection fraction of 55% and evidence for mild mid to basal inferior hypocontractility.  CAD - Continue heparin IV - With left  main ostial stenosis will keep in stepdown until surgery - Await CABG. He had many questions about timing of surgery.   DM - holding metformin - ISS  HTN - stable - atenolol  UNSTABLE ANGINA -Imdur, atenolol, heparin IV no active pain  HL - Zetia, fenofibrate (intolerant to statins)  SKAINS, MARK 09/29/2014, 8:49 AM

## 2014-09-29 NOTE — Progress Notes (Addendum)
Pre-op Cardiac Surgery  Carotid Findings:  1-39% ICA stenosis.  Vertebral artery flow is antegrade.   Sharion Dove, RVS 09/29/14   Upper Extremity Right Left  Brachial Pressures 120 122  Radial Waveforms Tri Tri  Ulnar Waveforms Tri Tri  Palmar Arch (Allen's Test) Obliterates with radial and ulnar compression Decreases >50% with radial compression, normal with ulnar compression   Findings:  Palpable pedal pulses.  Landry Mellow, RDMS, RVT 10/01/2014

## 2014-09-29 NOTE — Progress Notes (Signed)
ANTICOAGULATION CONSULT NOTE - Follow Up Consult  Pharmacy Consult for Heparin Indication: CAD awaiting CABG  Allergies  Allergen Reactions  . Statins Other (See Comments)    "Affects liver enzymes"    Patient Measurements: Height: 6\' 2"  (188 cm) Weight: 220 lb (99.791 kg) IBW/kg (Calculated) : 82.2 Heparin Dosing Weight: 100 kg  Vital Signs: Temp: 97.9 F (36.6 C) (11/21 1206) Temp Source: Oral (11/21 1206) BP: 152/77 mmHg (11/21 1206) Pulse Rate: 54 (11/21 0812)  Labs:  Recent Labs  09/27/14 1439 09/29/14 0310 09/29/14 1116  HGB 14.2 13.0  --   HCT 40.9 39.9  --   PLT 166 127*  --   LABPROT 13.7  --   --   INR 1.05  --   --   HEPARINUNFRC  --  0.24* 0.35  CREATININE 0.86  --   --     Estimated Creatinine Clearance: 108 mL/min (by C-G formula based on Cr of 0.86).  Assessment:  Heparin level is now low therapeutic (0.25) on 1500 units/hr.  Platelet count trended down from 166 to 127.  No bleeding noted.  Goal of Therapy:  Heparin level 0.3-0.7 units/ml Monitor platelets by anticoagulation protocol: Yes   Plan:    Increase heparin drip from 1500 to 1600 units/hr, to try to keep level in target range.  Next heparin level and CBC in am.  Arty Baumgartner, [place] Pager: [phone] 09/29/2014,1:16 PM

## 2014-09-29 NOTE — Progress Notes (Signed)
Echo Lab  2D Echocardiogram completed.  [place], RDCS 09/29/2014 11:02 AM

## 2014-09-29 NOTE — Progress Notes (Signed)
ANTICOAGULATION CONSULT NOTE - Follow Up Consult  Pharmacy Consult for heparin Indication: CAD awaiting CABG  Labs:  Recent Labs  09/27/14 1439 09/29/14 0310  HGB 14.2 13.0  HCT 40.9 39.9  PLT 166 127*  LABPROT 13.7  --   INR 1.05  --   HEPARINUNFRC  --  0.24*  CREATININE 0.86  --     Assessment: 67yo male subtherapeutic on heparin with initial dosing for CAD.  Goal of Therapy:  Heparin level 0.3-0.7 units/ml   Plan:  Will increase heparin gtt by 1-2 units/kg/hr to 1500 units/hr and check level in 6hr.  Wynona Neat, PharmD, BCPS  09/29/2014,5:17 AM

## 2014-09-30 ENCOUNTER — Encounter (HOSPITAL_COMMUNITY): Payer: Self-pay

## 2014-09-30 LAB — HEPARIN LEVEL (UNFRACTIONATED)
HEPARIN UNFRACTIONATED: 0.8 [IU]/mL — AB (ref 0.30–0.70)
Heparin Unfractionated: 0.82 IU/mL — ABNORMAL HIGH (ref 0.30–0.70)

## 2014-09-30 LAB — CBC
HCT: 39.2 % (ref 39.0–52.0)
Hemoglobin: 13.5 g/dL (ref 13.0–17.0)
MCH: 31.6 pg (ref 26.0–34.0)
MCHC: 34.4 g/dL (ref 30.0–36.0)
MCV: 91.8 fL (ref 78.0–100.0)
PLATELETS: 120 10*3/uL — AB (ref 150–400)
RBC: 4.27 MIL/uL (ref 4.22–5.81)
RDW: 12.5 % (ref 11.5–15.5)
WBC: 3.9 10*3/uL — ABNORMAL LOW (ref 4.0–10.5)

## 2014-09-30 LAB — GLUCOSE, CAPILLARY
GLUCOSE-CAPILLARY: 139 mg/dL — AB (ref 70–99)
GLUCOSE-CAPILLARY: 112 mg/dL — AB (ref 70–99)
Glucose-Capillary: 127 mg/dL — ABNORMAL HIGH (ref 70–99)
Glucose-Capillary: 115 mg/dL — ABNORMAL HIGH (ref 70–99)

## 2014-09-30 NOTE — Plan of Care (Signed)
Problem: Consults Goal: Skin Care Protocol Initiated - if Braden Score 18 or less If consults are not indicated, leave blank or document N/A Outcome: Progressing Goal: Tobacco Cessation referral if indicated Outcome: Not Applicable Date Met:  14/23/95 Goal: Nutrition Consult-if indicated Outcome: Progressing Goal: Diabetes Guidelines if Diabetic/Glucose > 140 If diabetic or lab glucose is > 140 mg/dl - Initiate Diabetes/Hyperglycemia Guidelines & Document Interventions  Outcome: Progressing  Problem: Phase I Progression Outcomes Goal: Pain controlled with appropriate interventions Outcome: Progressing Goal: Voiding-avoid urinary catheter unless indicated Outcome: Completed/Met Date Met:  09/30/14 Goal: Hemodynamically stable Outcome: Progressing Goal: Distal pulses equal to baseline Outcome: Completed/Met Date Met:  09/30/14 Goal: Vascular site scale level 0 - I Vascular Site Scale Level 0: No bruising/bleeding/hematoma Level I (Mild): Bruising/Ecchymosis, minimal bleeding/ooozing, palpable hematoma < 3 cm Level II (Moderate): Bleeding not affecting hemodynamic parameters, pseudoaneurysm, palpable hematoma > 3 cm Level III (Severe) Bleeding which affects hemodynamic parameters or retroperitoneal hemorrhage  Outcome: Completed/Met Date Met:  09/30/14

## 2014-09-30 NOTE — Progress Notes (Signed)
ANTICOAGULATION CONSULT NOTE - Follow Up Consult  Pharmacy Consult for heparin Indication: CAD awaiting CABG  Labs:  Recent Labs  09/27/14 1439 09/29/14 0310 09/29/14 1116 09/30/14 0322  HGB 14.2 13.0  --  13.5  HCT 40.9 39.9  --  39.2  PLT 166 127*  --  120*  LABPROT 13.7  --   --   --   INR 1.05  --   --   --   HEPARINUNFRC  --  0.24* 0.35 0.80*  CREATININE 0.86  --   --   --     Assessment: 68yo male now supratherapeutic on heparin after one level at low end of goal, no gtt issues per RN.  Goal of Therapy:  Heparin level 0.3-0.7 units/ml   Plan:  Will decrease heparin gtt by 1 units/kg/hr to 1400 units/hr and check level in 6hr.  Wynona Neat, PharmD, BCPS  09/30/2014,4:31 AM

## 2014-09-30 NOTE — Progress Notes (Signed)
Cardiologist: Dr. Martinique  Subjective:  No CP, no SOB, feels well. Waiting for CABG. Had angina (mid neck, throat) 2 weeks ago. Prior PCI's.   Objective:  Vital Signs in the last 24 hours: Temp:  [97.5 F (36.4 C)-98.6 F (37 C)] 98.1 F (36.7 C) (11/22 0817) Pulse Rate:  [17-74] 74 (11/22 0929) Resp:  [14-21] 14 (11/22 0817) BP: (100-153)/(62-88) 153/88 mmHg (11/22 0817) SpO2:  [96 %-98 %] 98 % (11/22 0817)  Intake/Output from previous day: 11/21 0701 - 11/22 0700 In: 2291.6 [P.O.:1680; I.V.:611.6] Out: 3420 [Urine:3420]   Physical Exam: General: Well developed, well nourished, in no acute distress. Head:  Normocephalic and atraumatic. Lungs: Clear to auscultation and percussion. Heart: Normal S1 and S2.  No murmur, rubs or gallops.  Abdomen: soft, non-tender, positive bowel sounds. Extremities: No clubbing or cyanosis. No edema. Cath site intact Neurologic: Alert and oriented x 3.    Lab Results:  Recent Labs  09/29/14 0310 09/30/14 0322  WBC 4.0 3.9*  HGB 13.0 13.5  PLT 127* 120*    Recent Labs  09/27/14 1439  NA 138  K 4.2  CL 106  CO2 23  GLUCOSE 114*  BUN 13  CREATININE 0.86    Imaging: No results found.  Telemetry: No adverse rhythms Personally viewed.   EKG:  09/20/14: SB 58 no ST changes  Cardiac Studies:  ECHO pending  Scheduled Meds: . allopurinol  150 mg Oral Daily  . aspirin EC  81 mg Oral Daily  . atenolol  25 mg Oral BID  . cholestyramine light  4 g Oral BID  . ezetimibe  10 mg Oral Daily  . fenofibrate  160 mg Oral Daily  . isosorbide mononitrate  30 mg Oral Daily  . levothyroxine  100 mcg Oral QAC breakfast  . pantoprazole  40 mg Oral Daily  . PARoxetine  20 mg Oral Daily  . ramipril  10 mg Oral Daily   Continuous Infusions: . sodium chloride 10 mL/hr at 09/30/14 0800  . heparin 1,500 Units/hr (09/30/14 0800)   PRN Meds:.acetaminophen, ondansetron (ZOFRAN) IV  Assessment/Plan:   68 year old patient of Dr. Peter  Martinique who is status post stenting of his left circumflex coronary artery in 1996 and right coronary artery in 2002. He has a total of 3 stents. He has a history of significant statin intolerance, elevated CPK levels on Lipitor. He has been unsteady a with continued high lipid studies. He recently had noticed some episodes of chest discomfort which he felt was due to reflux. The patient was referred for a three-year follow-up nuclear perfusion study was abnormal and revealed a moderate size and intensity, reversible inferior defect compatible with ischemia. He developed 2.5 mm horizontal ST segment depression inferolaterally.   Cath on 09/28/14: Severe coronary calcification and multivessel CAD with 60-70% ostial left main stenosis; segmental 50% proximal and mid LAD stenoses; 40% narrowing within the stented segment in the left circumflex coronary artery; and significant ostial RCA calcification with 95% ostial stenosis followed by a 90% proximal stenosis. An area of calcification with probable thrombus, and 40% mid RCA stenosis and a previously placed stent with a patent distal RCA stent.  Normal LV contractility with an ejection fraction of 55% and evidence for mild mid to basal inferior hypocontractility.  CAD - Continue heparin IV, PLTs decreasing - monitor [id number] - With left main ostial stenosis and RCA thrombus will keep in stepdown until surgery - Await CABG on WED.   DM - holding metformin -  ISS  HTN - stable - atenolol  UNSTABLE ANGINA -Imdur, atenolol, heparin IV no active pain  HL - Zetia, fenofibrate (intolerant to statins)  SKAINS, MARK 09/30/2014, 10:04 AM

## 2014-09-30 NOTE — Plan of Care (Signed)
Problem: Phase I Progression Outcomes Goal: Anginal pain relieved Outcome: Completed/Met Date Met:  09/30/14 Goal: Aspirin unless contraindicated Outcome: Completed/Met Date Met:  09/30/14 Goal: Voiding-avoid urinary catheter unless indicated Outcome: Completed/Met Date Met:  09/30/14 Goal: Vascular site scale level 0 - I Vascular Site Scale Level 0: No bruising/bleeding/hematoma Level I (Mild): Bruising/Ecchymosis, minimal bleeding/ooozing, palpable hematoma < 3 cm Level II (Moderate): Bleeding not affecting hemodynamic parameters, pseudoaneurysm, palpable hematoma > 3 cm Level III (Severe) Bleeding which affects hemodynamic parameters or retroperitoneal hemorrhage  Outcome: Completed/Met Date Met:  09/30/14

## 2014-09-30 NOTE — Progress Notes (Signed)
ANTICOAGULATION CONSULT NOTE - Follow Up Consult  Pharmacy Consult for heparin Indication: CAD awaiting CABG  Labs:  Recent Labs  09/27/14 1439 09/29/14 0310 09/29/14 1116 09/30/14 0322  HGB 14.2 13.0  --  13.5  HCT 40.9 39.9  --  39.2  PLT 166 127*  --  120*  LABPROT 13.7  --   --   --   INR 1.05  --   --   --   HEPARINUNFRC  --  0.24* 0.35 0.80*  CREATININE 0.86  --   --   --     Assessment: 68yo male now supratherapeutic on heparin after one level at low end of goal, no gtt issues per RN.  Goal of Therapy:  Heparin level 0.3-0.7 units/ml   Plan:  Will decrease heparin gtt by 1 units/kg/hr to 1500 units/hr and check level in 6hr.  Wynona Neat, PharmD, BCPS  09/30/2014,4:34 AM

## 2014-09-30 NOTE — Progress Notes (Signed)
ANTICOAGULATION CONSULT NOTE - Follow Up Consult  Pharmacy Consult for Heparin Indication: CAD awaiting CABG  Allergies  Allergen Reactions  . Statins Other (See Comments)    "Affects liver enzymes"    Patient Measurements: Height: 6\' 2"  (188 cm) Weight: 220 lb (99.791 kg) IBW/kg (Calculated) : 82.2 Heparin Dosing Weight: 100 kg  Vital Signs: Temp: 97.3 F (36.3 C) (11/22 1117) Temp Source: Oral (11/22 1117) BP: 126/79 mmHg (11/22 1117) Pulse Rate: 74 (11/22 0929)  Labs:  Recent Labs  09/29/14 0310 09/29/14 1116 09/30/14 0322 09/30/14 1036  HGB 13.0  --  13.5  --   HCT 39.9  --  39.2  --   PLT 127*  --  120*  --   HEPARINUNFRC 0.24* 0.35 0.80* 0.82*    Estimated Creatinine Clearance: 108 mL/min (by C-G formula based on Cr of 0.86).  Assessment:  Heparin level remains supratherapeutic (0.82) on 1500 units/hr. No drop in heparin level after rate decrease from 1600 units/hr earlier this morning.  Platelet count trended down from 166 to 127 to 120.  No bleeding noted.  Goal of Therapy:  Heparin level 0.3-0.7 units/ml Monitor platelets by anticoagulation protocol: Yes   Plan:    Decrease heparin drip to 1350 units/hr units/hr.   Next heparin level and CBC in am.  Arty Baumgartner, [place] Pager: [phone] 09/30/2014,3:27 PM

## 2014-09-30 NOTE — Plan of Care (Signed)
Problem: Phase I Progression Outcomes Goal: Post Cath/PCI return to appropriate Path Outcome: Completed/Met Date Met:  09/30/14

## 2014-10-01 ENCOUNTER — Inpatient Hospital Stay (HOSPITAL_COMMUNITY): Payer: Medicare HMO

## 2014-10-01 LAB — PULMONARY FUNCTION TEST
FEF 25-75 PRE: 1.99 L/s
FEF 25-75 Post: 2.6 L/sec
FEF2575-%Change-Post: 30 %
FEF2575-%Pred-Post: 87 %
FEF2575-%Pred-Pre: 67 %
FEV1-%CHANGE-POST: 6 %
FEV1-%Pred-Post: 75 %
FEV1-%Pred-Pre: 71 %
FEV1-PRE: 2.7 L
FEV1-Post: 2.87 L
FEV1FVC-%Change-Post: 0 %
FEV1FVC-%Pred-Pre: 98 %
FEV6-%Change-Post: 6 %
FEV6-%PRED-POST: 81 %
FEV6-%Pred-Pre: 76 %
FEV6-POST: 3.94 L
FEV6-Pre: 3.69 L
FEV6FVC-%CHANGE-POST: 0 %
FEV6FVC-%PRED-PRE: 104 %
FEV6FVC-%Pred-Post: 105 %
FVC-%CHANGE-POST: 6 %
FVC-%PRED-PRE: 72 %
FVC-%Pred-Post: 77 %
FVC-POST: 3.95 L
FVC-PRE: 3.71 L
PRE FEV1/FVC RATIO: 73 %
Post FEV1/FVC ratio: 73 %
Post FEV6/FVC ratio: 100 %
Pre FEV6/FVC Ratio: 99 %

## 2014-10-01 LAB — GLUCOSE, CAPILLARY
Glucose-Capillary: 124 mg/dL — ABNORMAL HIGH (ref 70–99)
Glucose-Capillary: 102 mg/dL — ABNORMAL HIGH (ref 70–99)
Glucose-Capillary: 120 mg/dL — ABNORMAL HIGH (ref 70–99)
Glucose-Capillary: 132 mg/dL — ABNORMAL HIGH (ref 70–99)

## 2014-10-01 LAB — CBC
HEMATOCRIT: 41.1 % (ref 39.0–52.0)
Hemoglobin: 13.8 g/dL (ref 13.0–17.0)
MCH: 31.7 pg (ref 26.0–34.0)
MCHC: 33.6 g/dL (ref 30.0–36.0)
MCV: 94.3 fL (ref 78.0–100.0)
PLATELETS: 125 10*3/uL — AB (ref 150–400)
RBC: 4.36 MIL/uL (ref 4.22–5.81)
RDW: 12.3 % (ref 11.5–15.5)
WBC: 3.9 10*3/uL — AB (ref 4.0–10.5)

## 2014-10-01 LAB — BASIC METABOLIC PANEL
ANION GAP: 15 (ref 5–15)
BUN: 13 mg/dL (ref 6–23)
CHLORIDE: 103 meq/L (ref 96–112)
CO2: 21 meq/L (ref 19–32)
CREATININE: 0.8 mg/dL (ref 0.50–1.35)
Calcium: 9.2 mg/dL (ref 8.4–10.5)
GFR calc Af Amer: >90 mL/min (ref >90)
GFR calc non Af Amer: >90 mL/min (ref >90)
Glucose, Bld: 113 mg/dL — ABNORMAL HIGH (ref 70–99)
POTASSIUM: 4.1 meq/L (ref 3.7–5.3)
Sodium: 139 mEq/L (ref 137–147)

## 2014-10-01 LAB — HEPARIN LEVEL (UNFRACTIONATED)
HEPARIN UNFRACTIONATED: 0.26 [IU]/mL — AB (ref 0.30–0.70)
HEPARIN UNFRACTIONATED: 0.43 [IU]/mL (ref 0.30–0.70)
Heparin Unfractionated: 0.47 IU/mL (ref 0.30–0.70)

## 2014-10-01 MED ORDER — ALBUTEROL SULFATE (2.5 MG/3ML) 0.083% IN NEBU
2.5000 mg | INHALATION_SOLUTION | Freq: Once | RESPIRATORY_TRACT | Status: AC
  Administered 2014-10-01: 2.5 mg via RESPIRATORY_TRACT

## 2014-10-01 MED ORDER — INSULIN ASPART 100 UNIT/ML ~~LOC~~ SOLN: 0.0000 [IU] | Freq: Three times a day (TID) | SUBCUTANEOUS | Status: DC

## 2014-10-01 NOTE — Progress Notes (Signed)
ANTICOAGULATION CONSULT NOTE - Follow Up Consult  Pharmacy Consult for heparin Indication: CAD awaiting CABG  Labs:  Recent Labs  09/29/14 0310  09/30/14 0322 09/30/14 1036 10/01/14 0245  HGB 13.0  --  13.5  --  13.8  HCT 39.9  --  39.2  --  41.1  PLT 127*  --  120*  --  125*  HEPARINUNFRC 0.24*  < > 0.80* 0.82* 0.26*  < > = values in this interval not displayed.   Assessment: 68yo male now slightly subtherapeutic on heparin after rate decrease.  Goal of Therapy:  Heparin level 0.3-0.7 units/ml   Plan:  Will increase heparin gtt slightly to 1400 units/hr and check level in 6hr.  Wynona Neat, PharmD, BCPS  10/01/2014,4:17 AM

## 2014-10-01 NOTE — Progress Notes (Signed)
SUBJECTIVE:  Pt seen and examined in AM. No acute events overnight. Telemetry reveals SB/NSR. He denies chest pain or dyspnea. His carotid doppler yesterday was normal. He is scheduled for CABG on Wednesday morning. He reports myalgias and elevated CK level with multiple statins in the past.    OBJECTIVE:   Vitals:   Filed Vitals:   10/01/14 0907 10/01/14 1000 10/01/14 1100 10/01/14 1146  BP:    118/63  Pulse: 59   63  Temp:    97.4 F (36.3 C)  TempSrc:    Oral  Resp:  23 13 17   Height:      Weight:      SpO2:    97%   I&O's:   Intake/Output Summary (Last 24 hours) at 10/01/14 1200 Last data filed at 10/01/14 1100  Gross per 24 hour  Intake 1194.74 ml  Output   1725 ml  Net -530.26 ml   TELEMETRY: Reviewed telemetry pt in NSR:     PHYSICAL EXAM General: Well developed, well nourished, in no acute distress Head:   Normal cephalic and atramatic  Lungs:  Clear bilaterally to auscultation. Heart:   HRRR S1 S2  No JVD.   Abdomen: Abdomen soft and non-tender Msk:  Back normal,  Normal strength and tone for age. Extremities:  No edema.   Neuro: Alert and oriented. Psych:  Normal affect, responds appropriately Skin: No rash   LABS: Basic Metabolic Panel:  Recent Labs  10/01/14 0245  NA 139  K 4.1  CL 103  CO2 21  GLUCOSE 113*  BUN 13  CREATININE 0.80  CALCIUM 9.2   Liver Function Tests: No results for input(s): AST, ALT, ALKPHOS, BILITOT, PROT, ALBUMIN in the last 72 hours. No results for input(s): LIPASE, AMYLASE in the last 72 hours. CBC:  Recent Labs  09/30/14 0322 10/01/14 0245  WBC 3.9* 3.9*  HGB 13.5 13.8  HCT 39.2 41.1  MCV 91.8 94.3  PLT 120* 125*   Cardiac Enzymes: No results for input(s): CKTOTAL, CKMB, CKMBINDEX, TROPONINI in the last 72 hours. BNP: Invalid input(s): POCBNP D-Dimer: No results for input(s): DDIMER in the last 72 hours. Hemoglobin A1C: No results for input(s): HGBA1C in the last 72 hours. Fasting Lipid  Panel: No results for input(s): CHOL, HDL, LDLCALC, TRIG, CHOLHDL, LDLDIRECT in the last 72 hours. Thyroid Function Tests: No results for input(s): TSH, T4TOTAL, T3FREE, THYROIDAB in the last 72 hours.  Invalid input(s): FREET3 Anemia Panel: No results for input(s): VITAMINB12, FOLATE, FERRITIN, TIBC, IRON, RETICCTPCT in the last 72 hours. Coag Panel:   Lab Results  Component Value Date   INR 1.05 09/27/2014    RADIOLOGY: Dg Chest 2 View  09/28/2014   CLINICAL DATA:  Pain above the manubrium for 2 weeks. The patient is pre cardiac catheterization.  EXAM: CHEST  2 VIEW  COMPARISON:  October 23, 2008  FINDINGS: The heart size and mediastinal contours are within normal limits. There is no focal infiltrate, pulmonary edema, or pleural effusion. The visualized skeletal structures are unremarkable.  IMPRESSION: No active cardiopulmonary disease.   Electronically Signed   By: Abelardo Diesel M.D.   On: 09/28/2014 14:14   Principal Problem:   Unstable angina Active Problems:   Hypercholesterolemia   Coronary disease   Hypertension   Diabetes mellitus    ASSESSMENT:  68 yo man with pmh of HTN, T2DM, hypothyroidism, and CAD s/p 2 stents (1996 & 2002) who presented with abnormal Myoview exercise test found to have  severe multivessel CAD requiring CABG.   PLAN:    Unstable Angina in setting of multivessel CAD s/p DES x 2 to left circumflex & RCA (1996 & 2002) - Currently CP-free with no prior cardiac enzymes. Pt with left cardiac catherization on 09/2014 with 60-70% ostial left main stenosis and 40% mid RCA stenosis with probable thrombus. Pt scheduled for CABG with Dr. Roxan Hockey on Wednesday morning. Continue IV heparin and home 81 mg daily aspirin, atenolol 25 mg BID, imdur 30 mg daily, ramipril 10 mg daily, and anti-lipid medications.    Hypertension - Currently normotensive. Continue home atenolol 25 mg BID, imdur 30 mg daily, and ramipril 10 mg daily.  Hyperlipidemia - Pt reports  intolerance to statin therapy. Last lipid panel on 08/06/14 with LDL 145 not at goal <70-100. TG 357 and total cholesterol 257. He is currently on home fenofibrate 160 mg daily, cholestyramine 4 g BID, zetia 10 mg daily. Pt needs referral to lipid clinic for consideration of PCSK9 inhibitor.   Acute thrombocytopenia - Platelet count 125K. Currently on IV heparin with no active bleeding. Continue to monitor.    Non-insulin dependent Type II DM - Last A1c 6 on 08/13/14. Hold home metformin 500 mg BID. Currently on moderate SSI. Hypothyroidism - Last TSH and free T4 normal on 08/06/14. Continue home synthroid 100 mcg daily.  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 Continue home allopurinol 150 mg daily.  Depression - Continue home paroxetine 20 mg daily.     Juluis Mire, MD  PGY-II IMTS Pager: [phone] 10/01/2014  12:00 PM  History and all data above reviewed.  Patient examined.  I agree with the findings as above.  The patient exam reveals COR:RRR  ,  Lungs: Clear  ,  Abd: Positive bowel sounds, no rebound no guarding, Ext No edema  .  All available labs, radiology testing, previous records reviewed. Agree with documented assessment and plan. CAD:  I reviewed the films and discussed them with Dr. Martinique.  The LM disease is likely not critical.  However, he has significant mid LAD stenosis and severe ostial and diffuse RCA stenosis in a large RCA.  Intervention on this vessel percutaneously would be complicated and likely require multiple additional stents.  I agree with plans for CABG as outlined.  Discussed with patient and answered questions.    Jeneen Rinks Cheryll Keisler  6:42 PM  10/01/2014

## 2014-10-01 NOTE — Plan of Care (Signed)
Problem: Phase I Progression Outcomes Goal: Hemodynamically stable Outcome: Completed/Met Date Met:  10/01/14

## 2014-10-01 NOTE — Progress Notes (Signed)
ANTICOAGULATION CONSULT NOTE - Follow Up Consult  Pharmacy Consult for Heparin Indication: CAD awaiting CABG  Allergies  Allergen Reactions  . Statins Other (See Comments)    "Affects liver enzymes"    Patient Measurements: Height: 6\' 2"  (188 cm) Weight: 220 lb (99.791 kg) IBW/kg (Calculated) : 82.2 Heparin Dosing Weight: 100 kg  Vital Signs: Temp: 97.4 F (36.3 C) (11/23 1146) Temp Source: Oral (11/23 1146) BP: 118/63 mmHg (11/23 1146) Pulse Rate: 63 (11/23 1146)  Labs:  Recent Labs  09/29/14 0310  09/30/14 0322 09/30/14 1036 10/01/14 0245 10/01/14 1100  HGB 13.0  --  13.5  --  13.8  --   HCT 39.9  --  39.2  --  41.1  --   PLT 127*  --  120*  --  125*  --   HEPARINUNFRC 0.24*  < > 0.80* 0.82* 0.26* 0.47  CREATININE  --   --   --   --  0.80  --   < > = values in this interval not displayed.  Estimated Creatinine Clearance: 116.1 mL/min (by C-G formula based on Cr of 0.8).  Assessment: 68 yo M admitted on 09/28/2014 after abnormal stress test. Cath revealed significant multi-vessel CAD with plans for CABG on 11/25. HL is now therapeutic at 0.47. H/H remains wnl and stable, plt remain low but stable with no reported s/s bleeding.   Goal of Therapy:  Heparin level 0.3-0.7 units/ml Monitor platelets by anticoagulation protocol: Yes   Plan:  - Continue heparin gtt at 1400 u/hr - Confirmatory HL in 6 hours - Daily HL/CBC - Monitor for s/s bleeding - F/u CABG plans on 11/25  Harolyn Rutherford, PharmD Clinical Pharmacist - Resident Pager: [phone] Pharmacy: [phone] 10/01/2014 11:53 AM

## 2014-10-01 NOTE — Progress Notes (Signed)
ANTICOAGULATION CONSULT NOTE - Follow Up Consult  Pharmacy Consult for Heparin Indication: CAD awaiting CABG  Allergies  Allergen Reactions  . Statins Other (See Comments)    "Affects liver enzymes"    Patient Measurements: Height: 6\' 2"  (188 cm) Weight: 220 lb (99.791 kg) IBW/kg (Calculated) : 82.2 Heparin Dosing Weight: 100 kg  Vital Signs: Temp: 97.9 F (36.6 C) (11/23 1617) Temp Source: Oral (11/23 1617) BP: 106/83 mmHg (11/23 1617) Pulse Rate: 63 (11/23 1617)  Labs:  Recent Labs  09/29/14 0310  09/30/14 0322  10/01/14 0245 10/01/14 1100 10/01/14 1615  HGB 13.0  --  13.5  --  13.8  --   --   HCT 39.9  --  39.2  --  41.1  --   --   PLT 127*  --  120*  --  125*  --   --   HEPARINUNFRC 0.24*  < > 0.80*  < > 0.26* 0.47 0.43  CREATININE  --   --   --   --  0.80  --   --   < > = values in this interval not displayed.  Estimated Creatinine Clearance: 116.1 mL/min (by C-G formula based on Cr of 0.8).  Assessment: 68 yo M admitted on 09/28/2014 after abnormal stress test. Cath revealed significant multi-vessel CAD with plans for CABG on 11/25. HL is confirmed therapeutic at 0.43.   Goal of Therapy:  Heparin level 0.3-0.7 units/ml Monitor platelets by anticoagulation protocol: Yes   Plan:   - Continue heparin gtt at 1400 u/hr - Monitor for s/s bleeding -Daily HL/CBC  Hildred Laser, Pharm D 10/01/2014 5:21 PM

## 2014-10-02 DIAGNOSIS — R740 Nonspecific elevation of levels of transaminase and lactic acid dehydrogenase [LDH]: Secondary | ICD-10-CM

## 2014-10-02 DIAGNOSIS — R7401 Elevation of levels of liver transaminase levels: Secondary | ICD-10-CM

## 2014-10-02 DIAGNOSIS — I25110 Atherosclerotic heart disease of native coronary artery with unstable angina pectoris: Principal | ICD-10-CM

## 2014-10-02 LAB — COMPREHENSIVE METABOLIC PANEL
ALT: 80 U/L — ABNORMAL HIGH (ref 0–53)
ANION GAP: 15 (ref 5–15)
AST: 76 U/L — ABNORMAL HIGH (ref 0–37)
Albumin: 4.1 g/dL (ref 3.5–5.2)
Alkaline Phosphatase: 47 U/L (ref 39–117)
BILIRUBIN TOTAL: 0.7 mg/dL (ref 0.3–1.2)
BUN: 14 mg/dL (ref 6–23)
CALCIUM: 9.6 mg/dL (ref 8.4–10.5)
CHLORIDE: 101 meq/L (ref 96–112)
CO2: 24 meq/L (ref 19–32)
CREATININE: 0.94 mg/dL (ref 0.50–1.35)
GFR, EST NON AFRICAN AMERICAN: 86 mL/min — AB (ref >90)
GLUCOSE: 132 mg/dL — AB (ref 70–99)
Potassium: 4.8 mEq/L (ref 3.7–5.3)
Sodium: 140 mEq/L (ref 137–147)
Total Protein: 6.9 g/dL (ref 6.0–8.3)

## 2014-10-02 LAB — CBC
HEMATOCRIT: 41.2 % (ref 39.0–52.0)
Hemoglobin: 13.5 g/dL (ref 13.0–17.0)
MCH: 31.1 pg (ref 26.0–34.0)
MCHC: 32.8 g/dL (ref 30.0–36.0)
MCV: 94.9 fL (ref 78.0–100.0)
Platelets: 129 10*3/uL — ABNORMAL LOW (ref 150–400)
RBC: 4.34 MIL/uL (ref 4.22–5.81)
RDW: 12.5 % (ref 11.5–15.5)
WBC: 3.9 10*3/uL — AB (ref 4.0–10.5)

## 2014-10-02 LAB — GLUCOSE, CAPILLARY
Glucose-Capillary: 118 mg/dL — ABNORMAL HIGH (ref 70–99)
Glucose-Capillary: 120 mg/dL — ABNORMAL HIGH (ref 70–99)
Glucose-Capillary: 140 mg/dL — ABNORMAL HIGH (ref 70–99)

## 2014-10-02 LAB — TYPE AND SCREEN
ABO/RH(D): O NEG
Antibody Screen: NEGATIVE

## 2014-10-02 LAB — POCT I-STAT 3, ART BLOOD GAS (G3+)
Acid-base deficit: 2 mmol/L (ref 0.0–2.0)
BICARBONATE: 23.1 meq/L (ref 20.0–24.0)
O2 Saturation: 95 %
PO2 ART: 76 mmHg — AB (ref 80.0–100.0)
TCO2: 24 mmol/L (ref 0–100)
pCO2 arterial: 37.8 mmHg (ref 35.0–45.0)
pH, Arterial: 7.394 (ref 7.350–7.450)

## 2014-10-02 LAB — TROPONIN I: Troponin I: <0.3 ng/mL (ref <0.30)

## 2014-10-02 LAB — ABO/RH: ABO/RH(D): O NEG

## 2014-10-02 LAB — MAGNESIUM: MAGNESIUM: 2.1 mg/dL (ref 1.5–2.5)

## 2014-10-02 LAB — HEPARIN LEVEL (UNFRACTIONATED): Heparin Unfractionated: 0.43 IU/mL (ref 0.30–0.70)

## 2014-10-02 LAB — CK: Total CK: 175 U/L (ref 7–232)

## 2014-10-02 MED ORDER — ALPRAZOLAM 0.25 MG PO TABS: 0.2500 mg | ORAL_TABLET | ORAL | Status: DC | PRN

## 2014-10-02 MED ORDER — NITROGLYCERIN IN D5W 200-5 MCG/ML-% IV SOLN
2.0000 ug/min | INTRAVENOUS | Status: AC
  Administered 2014-10-03: 3 ug/min via INTRAVENOUS
  Filled 2014-10-02: qty 250

## 2014-10-02 MED ORDER — METOPROLOL TARTRATE 12.5 MG HALF TABLET
12.5000 mg | ORAL_TABLET | Freq: Once | ORAL | Status: DC
  Filled 2014-10-02: qty 1

## 2014-10-02 MED ORDER — INSULIN REGULAR HUMAN 100 UNIT/ML IJ SOLN
INTRAMUSCULAR | Status: AC
  Administered 2014-10-03: 1 [IU]/h via INTRAVENOUS
  Filled 2014-10-02: qty 2.5

## 2014-10-02 MED ORDER — SODIUM CHLORIDE 0.9 % IV SOLN
INTRAVENOUS | Status: DC
  Filled 2014-10-02: qty 30

## 2014-10-02 MED ORDER — DEXTROSE 5 % IV SOLN
30.0000 ug/min | INTRAVENOUS | Status: AC
  Administered 2014-10-03: 5 ug/min via INTRAVENOUS
  Filled 2014-10-02 (×2): qty 2

## 2014-10-02 MED ORDER — CHLORHEXIDINE GLUCONATE CLOTH 2 % EX PADS
6.0000 | MEDICATED_PAD | Freq: Once | CUTANEOUS | Status: AC
  Administered 2014-10-03: 6 via TOPICAL

## 2014-10-02 MED ORDER — CEFUROXIME SODIUM 750 MG IJ SOLR
750.0000 mg | INTRAMUSCULAR | Status: DC
  Filled 2014-10-02: qty 750

## 2014-10-02 MED ORDER — DEXMEDETOMIDINE HCL IN NACL 400 MCG/100ML IV SOLN
0.1000 ug/kg/h | INTRAVENOUS | Status: AC
  Administered 2014-10-03: .2 ug/kg/h via INTRAVENOUS
  Filled 2014-10-02: qty 100

## 2014-10-02 MED ORDER — CHLORHEXIDINE GLUCONATE CLOTH 2 % EX PADS
6.0000 | MEDICATED_PAD | Freq: Once | CUTANEOUS | Status: AC
  Administered 2014-10-02: 6 via TOPICAL

## 2014-10-02 MED ORDER — POTASSIUM CHLORIDE 2 MEQ/ML IV SOLN
80.0000 meq | INTRAVENOUS | Status: DC
  Filled 2014-10-02: qty 40

## 2014-10-02 MED ORDER — PLASMA-LYTE 148 IV SOLN
INTRAVENOUS | Status: AC
  Administered 2014-10-03: 500 mL
  Filled 2014-10-02: qty 2.5

## 2014-10-02 MED ORDER — DOPAMINE-DEXTROSE 3.2-5 MG/ML-% IV SOLN
0.0000 ug/kg/min | INTRAVENOUS | Status: DC
  Filled 2014-10-02: qty 250

## 2014-10-02 MED ORDER — CEFUROXIME SODIUM 1.5 G IJ SOLR
1.5000 g | INTRAMUSCULAR | Status: AC
  Administered 2014-10-03: 1.5 g via INTRAVENOUS
  Administered 2014-10-03: .75 g via INTRAVENOUS
  Filled 2014-10-02 (×2): qty 1.5

## 2014-10-02 MED ORDER — SODIUM CHLORIDE 0.9 % IV SOLN
INTRAVENOUS | Status: AC
  Administered 2014-10-03: 70 mL/h via INTRAVENOUS
  Filled 2014-10-02: qty 40

## 2014-10-02 MED ORDER — BISACODYL 5 MG PO TBEC: 5.0000 mg | DELAYED_RELEASE_TABLET | Freq: Once | ORAL | Status: DC

## 2014-10-02 MED ORDER — EPINEPHRINE HCL 1 MG/ML IJ SOLN
0.0000 ug/min | INTRAVENOUS | Status: DC
  Filled 2014-10-02: qty 4

## 2014-10-02 MED ORDER — DIAZEPAM 5 MG PO TABS
5.0000 mg | ORAL_TABLET | Freq: Once | ORAL | Status: AC
  Administered 2014-10-03: 5 mg via ORAL
  Filled 2014-10-02: qty 1

## 2014-10-02 MED ORDER — VANCOMYCIN HCL 10 G IV SOLR
1500.0000 mg | INTRAVENOUS | Status: AC
  Administered 2014-10-03: 1500 mg via INTRAVENOUS
  Filled 2014-10-02 (×2): qty 1500

## 2014-10-02 MED ORDER — MAGNESIUM SULFATE 50 % IJ SOLN
40.0000 meq | INTRAMUSCULAR | Status: DC
  Filled 2014-10-02: qty 10

## 2014-10-02 MED ORDER — TEMAZEPAM 15 MG PO CAPS: 15.0000 mg | ORAL_CAPSULE | Freq: Once | ORAL | Status: AC | PRN

## 2014-10-02 NOTE — Progress Notes (Signed)
SUBJECTIVE:  Pt seen and examined in AM. No acute events overnight. Telemetry reveals SB/NSR. He denies chest pain or dyspnea. His carotid doppler yesterday was normal. He is scheduled for CABG on Wednesday morning. He reports myalgias and elevated CK level with multiple statins in the past.    OBJECTIVE:   Vitals:   Filed Vitals:   10/01/14 1941 10/01/14 2103 10/01/14 2334 10/02/14 0505  BP: 144/77 128/70 147/74 135/87  Pulse:  52  59  Temp: 97.9 F (36.6 C)  97.9 F (36.6 C) 98.8 F (37.1 C)  TempSrc: Oral  Oral Oral  Resp: 26  14 16   Height:      Weight:      SpO2: 96%  97% 97%   I&O's:    Intake/Output Summary (Last 24 hours) at 10/02/14 1601 Last data filed at 10/02/14 0600  Gross per 24 hour  Intake    996 ml  Output   3000 ml  Net  -2004 ml   TELEMETRY: Reviewed telemetry pt in NSR:     PHYSICAL EXAM General: Well developed, well nourished, in no acute distress Head:   Normal cephalic and atramatic  Lungs:  Clear bilaterally to auscultation. Heart:   HRRR S1 S2  No JVD.   Abdomen: Abdomen soft and non-tender Msk:  Back normal,  Normal strength and tone for age. Extremities:  No edema.   Neuro: Alert and oriented. Psych:  Normal affect, responds appropriately Skin: No rash   LABS: Basic Metabolic Panel:  Recent Labs  10/01/14 0245 10/02/14 0214  NA 139 140  K 4.1 4.8  CL 103 101  CO2 21 24  GLUCOSE 113* 132*  BUN 13 14  CREATININE 0.80 0.94  CALCIUM 9.2 9.6  MG  --  2.1   Liver Function Tests:  Recent Labs  10/02/14 0214  AST 76*  ALT 80*  ALKPHOS 47  BILITOT 0.7  PROT 6.9  ALBUMIN 4.1   No results for input(s): LIPASE, AMYLASE in the last 72 hours. CBC:  Recent Labs  10/01/14 0245 10/02/14 0214  WBC 3.9* 3.9*  HGB 13.8 13.5  HCT 41.1 41.2  MCV 94.3 94.9  PLT 125* 129*   Cardiac Enzymes:  Recent Labs  10/02/14 0214  CKTOTAL 175  TROPONINI <0.30   BNP: Invalid input(s): POCBNP D-Dimer: No results for  input(s): DDIMER in the last 72 hours. Hemoglobin A1C: No results for input(s): HGBA1C in the last 72 hours. Fasting Lipid Panel: No results for input(s): CHOL, HDL, LDLCALC, TRIG, CHOLHDL, LDLDIRECT in the last 72 hours. Thyroid Function Tests: No results for input(s): TSH, T4TOTAL, T3FREE, THYROIDAB in the last 72 hours.  Invalid input(s): FREET3 Anemia Panel: No results for input(s): VITAMINB12, FOLATE, FERRITIN, TIBC, IRON, RETICCTPCT in the last 72 hours. Coag Panel:   Lab Results  Component Value Date   INR 1.05 09/27/2014    RADIOLOGY: Dg Chest 2 View  09/28/2014   CLINICAL DATA:  Pain above the manubrium for 2 weeks. The patient is pre cardiac catheterization.  EXAM: CHEST  2 VIEW  COMPARISON:  October 23, 2008  FINDINGS: The heart size and mediastinal contours are within normal limits. There is no focal infiltrate, pulmonary edema, or pleural effusion. The visualized skeletal structures are unremarkable.  IMPRESSION: No active cardiopulmonary disease.   Electronically Signed   By: Abelardo Diesel M.D.   On: 09/28/2014 14:14   Principal Problem:   Unstable angina Active Problems:   Hypercholesterolemia   Coronary disease  Hypertension   Diabetes mellitus   Transaminitis    ASSESSMENT:  69 yo man with pmh of HTN, T2DM, hypothyroidism, and CAD s/p 2 stents (1996 & 2002) who presented with abnormal Myoview exercise test found to have severe multivessel CAD requiring CABG.   PLAN:    Unstable Angina in setting of multivessel CAD s/p DES x 2 to left circumflex & RCA (1996 & 2002) - Currently CP-free with no prior cardiac enzymes. Pt with left cardiac catherization on 09/2014 with 60-70% ostial left main stenosis and 40% mid RCA stenosis with probable thrombus. Pt scheduled for CABG with Dr. Roxan Hockey tommorow morning. Continue IV heparin and home 81 mg daily aspirin, atenolol 25 mg BID, imdur 30 mg daily, ramipril 10 mg daily, and anti-lipid medications.    Hypertension -  Currently normotensive on home atenolol 25 mg BID, imdur 30 mg daily, and ramipril 10 mg daily.  Hyperlipidemia - Pt reports intolerance to statin therapy. Last lipid panel on 08/06/14 with LDL 145 not at goal <70-100. TG 357 and total cholesterol 257. He is currently on home fenofibrate 160 mg daily, cholestyramine 4 g BID, zetia 10 mg daily. Pt needs referral to lipid clinic for consideration of PCSK9 inhibitor.   Acute thrombocytopenia - Platelet count 132K. Currently on IV heparin with no active bleeding. Continue to monitor.   Transaminitis  - AST 76 and ALT 80.  Will obtain acute hepatitis panel. No prior abdominal imaging.   Non-insulin dependent Type II DM - Last A1c 6 on 08/13/14. Hold home metformin 500 mg BID. Currently on moderate SSI. Hypothyroidism - Last TSH and free T4 normal on 08/06/14. Continue home synthroid 100 mcg daily.  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 Continue home allopurinol 150 mg daily.  Depression - Continue home paroxetine 20 mg daily.     Juluis Mire, MD  PGY-II IMTS Pager: [phone] 10/02/2014  6:53 AM  History and all data above reviewed.  Patient examined.  I agree with the findings as above.  No further chest pain. The patient exam reveals COR:RRR  ,  Lungs: Clear  ,  Abd: Positive bowel sounds, no rebound no guarding, Ext No edema  .  All available labs, radiology testing, previous records reviewed. Agree with documented assessment and plan. CAD:  CABG in AM.    Minus Breeding  10:13 AM  10/02/2014

## 2014-10-02 NOTE — Plan of Care (Signed)
Problem: Consults Goal: Cardiac Surgery Patient Education ( See Patient Education module for education specifics.) Outcome: Completed/Met Date Met:  10/02/14 Goal: Cardiac Rehab Consult Outcome: Completed/Met Date Met:  10/02/14 Goal: Skin Care Protocol Initiated - if Braden Score 18 or less If consults are not indicated, leave blank or document N/A Outcome: Completed/Met Date Met:  10/02/14 Goal: Nutrition Consult-if indicated Outcome: Not Applicable Date Met:  97/35/32  Problem: Phase I - Pre-Op Goal: Pain controlled with appropriate interventions Outcome: Completed/Met Date Met:  10/02/14 Goal: Pre-Op Education completed Outcome: Completed/Met Date Met:  10/02/14

## 2014-10-02 NOTE — Progress Notes (Addendum)
ANTICOAGULATION CONSULT NOTE - Follow Up Consult  Pharmacy Consult for Heparin Indication: CAD awaiting CABG  Allergies  Allergen Reactions  . Statins Other (See Comments)    "Affects liver enzymes"    Patient Measurements: Height: 6\' 2"  (188 cm) Weight: 220 lb (99.791 kg) IBW/kg (Calculated) : 82.2 Heparin Dosing Weight: 100 kg  Vital Signs: Temp: 97.6 F (36.4 C) (11/24 1205) Temp Source: Oral (11/24 1205) BP: 139/80 mmHg (11/24 1205) Pulse Rate: 57 (11/24 1205)  Labs:  Recent Labs  09/30/14 0322  10/01/14 0245 10/01/14 1100 10/01/14 1615 10/02/14 0214  HGB 13.5  --  13.8  --   --  13.5  HCT 39.2  --  41.1  --   --  41.2  PLT 120*  --  125*  --   --  129*  HEPARINUNFRC 0.80*  < > 0.26* 0.47 0.43 0.43  CREATININE  --   --  0.80  --   --  0.94  CKTOTAL  --   --   --   --   --  175  TROPONINI  --   --   --   --   --  <0.30  < > = values in this interval not displayed.  Estimated Creatinine Clearance: 98.8 mL/min (by C-G formula based on Cr of 0.94).   Assessment: 68 yo M admitted on 09/28/2014 after abnormal stress test. No AC PTA. Cath revealed significant multi-vessel CAD with plans for CABG on 11/25. HL is confirmed therapeutic at 0.43. H/H wnl and stable, plt low but stable at 129. No s/s of bleed.  Goal of Therapy:  Heparin level 0.3-0.7 units/ml Monitor platelets by anticoagulation protocol: Yes   Plan:  Continue heparin gtt at 1400 units/hr Monitor daily HL, CBC, s/s of bleed Will stop heparin gtt at 0500 on 11/25 prior to CABG  Azana Kiesler J 10/02/2014,1:54 PM

## 2014-10-02 NOTE — Progress Notes (Signed)
CARDIAC REHAB PHASE I   PRE:  Rate/Rhythm: 69 SR    BP: sitting 144/95    SaO2:   MODE:  Ambulation: 350 ft   POST:  Rate/Rhythm: 97 SR    BP: sitting 160/86     SaO2:   Tolerated well. Discussed sternal precautions, mobility, IS use pre and post op. Excellent reception. Has [facility] booklet and has watched preop video. Gave guideline.  [id number]   Josephina Shih Chisholm CES, ACSM 10/02/2014 10:54 AM

## 2014-10-03 ENCOUNTER — Inpatient Hospital Stay (HOSPITAL_COMMUNITY): Payer: Medicare HMO | Admitting: Certified Registered"

## 2014-10-03 ENCOUNTER — Inpatient Hospital Stay (HOSPITAL_COMMUNITY): Payer: Medicare HMO

## 2014-10-03 ENCOUNTER — Encounter (HOSPITAL_COMMUNITY)
Admission: RE | Disposition: A | Discharge status: Home / Self Care | Payer: Medicare HMO | Source: Ambulatory Visit | Attending: Cardiology

## 2014-10-03 ENCOUNTER — Encounter (HOSPITAL_COMMUNITY): Payer: Self-pay | Admitting: Anesthesiology

## 2014-10-03 DIAGNOSIS — Z951 Presence of aortocoronary bypass graft: Secondary | ICD-10-CM

## 2014-10-03 HISTORY — PX: TEE WITHOUT CARDIOVERSION: SHX5443

## 2014-10-03 HISTORY — PX: CORONARY ARTERY BYPASS GRAFT: SHX141

## 2014-10-03 LAB — POCT I-STAT 4, (NA,K, GLUC, HGB,HCT)
GLUCOSE: 124 mg/dL — AB (ref 70–99)
HCT: 32 % — ABNORMAL LOW (ref 39.0–52.0)
Hemoglobin: 10.9 g/dL — ABNORMAL LOW (ref 13.0–17.0)
POTASSIUM: 3.6 meq/L — AB (ref 3.7–5.3)
SODIUM: 137 meq/L (ref 137–147)

## 2014-10-03 LAB — COMPREHENSIVE METABOLIC PANEL
ALK PHOS: 49 U/L (ref 39–117)
ALT: 104 U/L — ABNORMAL HIGH (ref 0–53)
ANION GAP: 13 (ref 5–15)
AST: 97 U/L — ABNORMAL HIGH (ref 0–37)
Albumin: 4.1 g/dL (ref 3.5–5.2)
BILIRUBIN TOTAL: 0.6 mg/dL (ref 0.3–1.2)
BUN: 12 mg/dL (ref 6–23)
CHLORIDE: 103 meq/L (ref 96–112)
CO2: 25 mEq/L (ref 19–32)
Calcium: 9.8 mg/dL (ref 8.4–10.5)
Creatinine, Ser: 0.88 mg/dL (ref 0.50–1.35)
GFR calc non Af Amer: 88 mL/min — ABNORMAL LOW (ref >90)
GLUCOSE: 120 mg/dL — AB (ref 70–99)
POTASSIUM: 4.7 meq/L (ref 3.7–5.3)
Sodium: 141 mEq/L (ref 137–147)
Total Protein: 7 g/dL (ref 6.0–8.3)

## 2014-10-03 LAB — POCT I-STAT, CHEM 8
BUN: 10 mg/dL (ref 6–23)
BUN: 10 mg/dL (ref 6–23)
BUN: 8 mg/dL (ref 6–23)
BUN: 9 mg/dL (ref 6–23)
BUN: 8 mg/dL (ref 6–23)
BUN: 8 mg/dL (ref 6–23)
CALCIUM ION: 1.07 mmol/L — AB (ref 1.13–1.30)
CHLORIDE: 105 meq/L (ref 96–112)
CHLORIDE: 106 meq/L (ref 96–112)
CREATININE: 0.6 mg/dL (ref 0.50–1.35)
Calcium, Ion: 1.26 mmol/L (ref 1.13–1.30)
Calcium, Ion: 1.23 mmol/L (ref 1.13–1.30)
Calcium, Ion: 1 mmol/L — ABNORMAL LOW (ref 1.13–1.30)
Calcium, Ion: 1.05 mmol/L — ABNORMAL LOW (ref 1.13–1.30)
Calcium, Ion: 1.2 mmol/L (ref 1.13–1.30)
Chloride: 104 mEq/L (ref 96–112)
Chloride: 101 mEq/L (ref 96–112)
Chloride: 96 mEq/L (ref 96–112)
Chloride: 103 mEq/L (ref 96–112)
Creatinine, Ser: 0.6 mg/dL (ref 0.50–1.35)
Creatinine, Ser: 0.6 mg/dL (ref 0.50–1.35)
Creatinine, Ser: 0.5 mg/dL (ref 0.50–1.35)
Creatinine, Ser: 0.6 mg/dL (ref 0.50–1.35)
Creatinine, Ser: 0.8 mg/dL (ref 0.50–1.35)
GLUCOSE: 136 mg/dL — AB (ref 70–99)
GLUCOSE: 127 mg/dL — AB (ref 70–99)
Glucose, Bld: 108 mg/dL — ABNORMAL HIGH (ref 70–99)
Glucose, Bld: 134 mg/dL — ABNORMAL HIGH (ref 70–99)
Glucose, Bld: 156 mg/dL — ABNORMAL HIGH (ref 70–99)
Glucose, Bld: 126 mg/dL — ABNORMAL HIGH (ref 70–99)
HCT: 40 % (ref 39.0–52.0)
HCT: 35 % — ABNORMAL LOW (ref 39.0–52.0)
HCT: 28 % — ABNORMAL LOW (ref 39.0–52.0)
HCT: 29 % — ABNORMAL LOW (ref 39.0–52.0)
HEMATOCRIT: 26 % — AB (ref 39.0–52.0)
HEMATOCRIT: 33 % — AB (ref 39.0–52.0)
HEMOGLOBIN: 9.9 g/dL — AB (ref 13.0–17.0)
HEMOGLOBIN: 8.8 g/dL — AB (ref 13.0–17.0)
Hemoglobin: 13.6 g/dL (ref 13.0–17.0)
Hemoglobin: 11.9 g/dL — ABNORMAL LOW (ref 13.0–17.0)
Hemoglobin: 9.5 g/dL — ABNORMAL LOW (ref 13.0–17.0)
Hemoglobin: 11.2 g/dL — ABNORMAL LOW (ref 13.0–17.0)
POTASSIUM: 3.9 meq/L (ref 3.7–5.3)
POTASSIUM: 3.9 meq/L (ref 3.7–5.3)
POTASSIUM: 3.8 meq/L (ref 3.7–5.3)
Potassium: 4.2 mEq/L (ref 3.7–5.3)
Potassium: 4.4 mEq/L (ref 3.7–5.3)
Potassium: 4.2 mEq/L (ref 3.7–5.3)
SODIUM: 139 meq/L (ref 137–147)
SODIUM: 129 meq/L — AB (ref 137–147)
SODIUM: 141 meq/L (ref 137–147)
Sodium: 137 mEq/L (ref 137–147)
Sodium: 138 mEq/L (ref 137–147)
Sodium: 132 mEq/L — ABNORMAL LOW (ref 137–147)
TCO2: 23 mmol/L (ref 0–100)
TCO2: 23 mmol/L (ref 0–100)
TCO2: 24 mmol/L (ref 0–100)
TCO2: 22 mmol/L (ref 0–100)
TCO2: 23 mmol/L (ref 0–100)
TCO2: 21 mmol/L (ref 0–100)

## 2014-10-03 LAB — CBC
HCT: 42.7 % (ref 39.0–52.0)
HCT: 33.1 % — ABNORMAL LOW (ref 39.0–52.0)
HEMATOCRIT: 33.2 % — AB (ref 39.0–52.0)
HEMOGLOBIN: 14.2 g/dL (ref 13.0–17.0)
HEMOGLOBIN: 11.4 g/dL — AB (ref 13.0–17.0)
Hemoglobin: 11.1 g/dL — ABNORMAL LOW (ref 13.0–17.0)
MCH: 30.9 pg (ref 26.0–34.0)
MCH: 31.3 pg (ref 26.0–34.0)
MCH: 30.7 pg (ref 26.0–34.0)
MCHC: 33.3 g/dL (ref 30.0–36.0)
MCHC: 34.3 g/dL (ref 30.0–36.0)
MCHC: 33.5 g/dL (ref 30.0–36.0)
MCV: 92.8 fL (ref 78.0–100.0)
MCV: 91.2 fL (ref 78.0–100.0)
MCV: 91.4 fL (ref 78.0–100.0)
PLATELETS: 89 10*3/uL — AB (ref 150–400)
Platelets: 142 10*3/uL — ABNORMAL LOW (ref 150–400)
Platelets: 79 10*3/uL — ABNORMAL LOW (ref 150–400)
RBC: 4.6 MIL/uL (ref 4.22–5.81)
RBC: 3.64 MIL/uL — ABNORMAL LOW (ref 4.22–5.81)
RBC: 3.62 MIL/uL — ABNORMAL LOW (ref 4.22–5.81)
RDW: 12.5 % (ref 11.5–15.5)
RDW: 12.4 % (ref 11.5–15.5)
RDW: 12.4 % (ref 11.5–15.5)
WBC: 4 10*3/uL (ref 4.0–10.5)
WBC: 4.1 10*3/uL (ref 4.0–10.5)
WBC: 5.7 10*3/uL (ref 4.0–10.5)

## 2014-10-03 LAB — CREATININE, SERUM
Creatinine, Ser: 0.79 mg/dL (ref 0.50–1.35)
GFR calc Af Amer: >90 mL/min (ref >90)
GFR calc non Af Amer: >90 mL/min (ref >90)

## 2014-10-03 LAB — POCT I-STAT 3, ART BLOOD GAS (G3+)
ACID-BASE DEFICIT: 3 mmol/L — AB (ref 0.0–2.0)
ACID-BASE EXCESS: 1 mmol/L (ref 0.0–2.0)
Acid-base deficit: 4 mmol/L — ABNORMAL HIGH (ref 0.0–2.0)
Acid-base deficit: 4 mmol/L — ABNORMAL HIGH (ref 0.0–2.0)
Bicarbonate: 26.2 mEq/L — ABNORMAL HIGH (ref 20.0–24.0)
Bicarbonate: 20.8 mEq/L (ref 20.0–24.0)
Bicarbonate: 21.7 mEq/L (ref 20.0–24.0)
Bicarbonate: 22.1 mEq/L (ref 20.0–24.0)
O2 SAT: 98 %
O2 Saturation: 100 %
O2 Saturation: 98 %
O2 Saturation: 99 %
PCO2 ART: 39.9 mmHg (ref 35.0–45.0)
PCO2 ART: 41.7 mmHg (ref 35.0–45.0)
PH ART: 7.341 — AB (ref 7.350–7.450)
PO2 ART: 358 mmHg — AB (ref 80.0–100.0)
Patient temperature: 36.6
TCO2: 27 mmol/L (ref 0–100)
TCO2: 22 mmol/L (ref 0–100)
TCO2: 23 mmol/L (ref 0–100)
TCO2: 23 mmol/L (ref 0–100)
pCO2 arterial: 42 mmHg (ref 35.0–45.0)
pCO2 arterial: 31.2 mmHg — ABNORMAL LOW (ref 35.0–45.0)
pH, Arterial: 7.403 (ref 7.350–7.450)
pH, Arterial: 7.423 (ref 7.350–7.450)
pH, Arterial: 7.331 — ABNORMAL LOW (ref 7.350–7.450)
pO2, Arterial: 85 mmHg (ref 80.0–100.0)
pO2, Arterial: 113 mmHg — ABNORMAL HIGH (ref 80.0–100.0)
pO2, Arterial: 131 mmHg — ABNORMAL HIGH (ref 80.0–100.0)

## 2014-10-03 LAB — PROTIME-INR
INR: 1.07 (ref 0.00–1.49)
INR: 1.36 (ref 0.00–1.49)
Prothrombin Time: 14 seconds (ref 11.6–15.2)
Prothrombin Time: 16.9 seconds — ABNORMAL HIGH (ref 11.6–15.2)

## 2014-10-03 LAB — APTT: aPTT: 31 seconds (ref 24–37)

## 2014-10-03 LAB — HEPATITIS PANEL, ACUTE
HCV Ab: NEGATIVE
Hep A IgM: NONREACTIVE
Hep B C IgM: NONREACTIVE
Hepatitis B Surface Ag: NEGATIVE

## 2014-10-03 LAB — HEMOGLOBIN A1C
Hgb A1c MFr Bld: 6.3 % — ABNORMAL HIGH (ref <5.7)
MEAN PLASMA GLUCOSE: 134 mg/dL — AB (ref <117)

## 2014-10-03 LAB — HEPARIN LEVEL (UNFRACTIONATED): HEPARIN UNFRACTIONATED: 0.48 [IU]/mL (ref 0.30–0.70)

## 2014-10-03 LAB — PLATELET COUNT: Platelets: 129 10*3/uL — ABNORMAL LOW (ref 150–400)

## 2014-10-03 LAB — MAGNESIUM: Magnesium: 3.1 mg/dL — ABNORMAL HIGH (ref 1.5–2.5)

## 2014-10-03 LAB — HEMOGLOBIN AND HEMATOCRIT, BLOOD
HCT: 30.2 % — ABNORMAL LOW (ref 39.0–52.0)
Hemoglobin: 10.4 g/dL — ABNORMAL LOW (ref 13.0–17.0)

## 2014-10-03 LAB — GLUCOSE, CAPILLARY: Glucose-Capillary: 119 mg/dL — ABNORMAL HIGH (ref 70–99)

## 2014-10-03 SURGERY — CORONARY ARTERY BYPASS GRAFTING (CABG)
Anesthesia: General | Site: Chest

## 2014-10-03 MED ORDER — FENTANYL CITRATE 0.05 MG/ML IJ SOLN
INTRAMUSCULAR | Status: AC
  Filled 2014-10-03: qty 5

## 2014-10-03 MED ORDER — METOPROLOL TARTRATE 12.5 MG HALF TABLET
12.5000 mg | ORAL_TABLET | Freq: Two times a day (BID) | ORAL | Status: DC
  Administered 2014-10-04: 12.5 mg via ORAL
  Filled 2014-10-03 (×5): qty 1

## 2014-10-03 MED ORDER — PROPOFOL 10 MG/ML IV BOLUS
INTRAVENOUS | Status: AC
  Filled 2014-10-03: qty 20

## 2014-10-03 MED ORDER — ROCURONIUM BROMIDE 50 MG/5ML IV SOLN
INTRAVENOUS | Status: AC
  Filled 2014-10-03: qty 3

## 2014-10-03 MED ORDER — LACTATED RINGERS IV SOLN
INTRAVENOUS | Status: DC
  Administered 2014-10-03: 19:00:00 via INTRAVENOUS

## 2014-10-03 MED ORDER — ONDANSETRON HCL 4 MG/2ML IJ SOLN
4.0000 mg | Freq: Four times a day (QID) | INTRAMUSCULAR | Status: DC | PRN
  Administered 2014-10-03: 4 mg via INTRAVENOUS
  Filled 2014-10-03: qty 2

## 2014-10-03 MED ORDER — OXYCODONE HCL 5 MG PO TABS
5.0000 mg | ORAL_TABLET | ORAL | Status: DC | PRN
  Administered 2014-10-04: 10 mg via ORAL
  Administered 2014-10-05: 5 mg via ORAL
  Filled 2014-10-03: qty 2
  Filled 2014-10-03: qty 1
  Filled 2014-10-03: qty 2

## 2014-10-03 MED ORDER — SODIUM CHLORIDE 0.9 % IJ SOLN
3.0000 mL | Freq: Two times a day (BID) | INTRAMUSCULAR | Status: DC
  Administered 2014-10-04 – 2014-10-05 (×3): 3 mL via INTRAVENOUS

## 2014-10-03 MED ORDER — DEXTROSE 5 % IV SOLN
1.5000 g | Freq: Two times a day (BID) | INTRAVENOUS | Status: AC
  Administered 2014-10-03 – 2014-10-05 (×4): 1.5 g via INTRAVENOUS
  Filled 2014-10-03 (×4): qty 1.5

## 2014-10-03 MED ORDER — SODIUM CHLORIDE 0.9 % IJ SOLN
3.0000 mL | INTRAMUSCULAR | Status: DC | PRN
  Administered 2014-10-05: 3 mL via INTRAVENOUS
  Filled 2014-10-03: qty 3

## 2014-10-03 MED ORDER — BISACODYL 5 MG PO TBEC
10.0000 mg | DELAYED_RELEASE_TABLET | Freq: Every day | ORAL | Status: DC
  Filled 2014-10-03 (×2): qty 2

## 2014-10-03 MED ORDER — ASPIRIN 81 MG PO CHEW: 324.0000 mg | CHEWABLE_TABLET | Freq: Every day | ORAL | Status: DC

## 2014-10-03 MED ORDER — PANTOPRAZOLE SODIUM 40 MG PO TBEC: 40.0000 mg | DELAYED_RELEASE_TABLET | Freq: Every day | ORAL | Status: DC

## 2014-10-03 MED ORDER — ALBUMIN HUMAN 5 % IV SOLN
INTRAVENOUS | Status: DC | PRN
  Administered 2014-10-03 (×3): via INTRAVENOUS

## 2014-10-03 MED ORDER — STERILE WATER FOR INJECTION IJ SOLN
INTRAMUSCULAR | Status: AC
  Filled 2014-10-03: qty 30

## 2014-10-03 MED ORDER — EPHEDRINE SULFATE 50 MG/ML IJ SOLN
INTRAMUSCULAR | Status: AC
  Filled 2014-10-03: qty 1

## 2014-10-03 MED ORDER — INSULIN REGULAR BOLUS VIA INFUSION
0.0000 [IU] | Freq: Three times a day (TID) | INTRAVENOUS | Status: DC
  Administered 2014-10-04: 3 [IU] via INTRAVENOUS
  Filled 2014-10-03: qty 10

## 2014-10-03 MED ORDER — SODIUM CHLORIDE 0.45 % IV SOLN: INTRAVENOUS | Status: DC

## 2014-10-03 MED ORDER — DEXMEDETOMIDINE HCL IN NACL 200 MCG/50ML IV SOLN
0.0000 ug/kg/h | INTRAVENOUS | Status: DC
  Administered 2014-10-03: 0.5 ug/kg/h via INTRAVENOUS
  Filled 2014-10-03: qty 50

## 2014-10-03 MED ORDER — LACTATED RINGERS IV SOLN
INTRAVENOUS | Status: DC | PRN
  Administered 2014-10-03: 08:00:00 via INTRAVENOUS

## 2014-10-03 MED ORDER — ASPIRIN EC 325 MG PO TBEC
325.0000 mg | DELAYED_RELEASE_TABLET | Freq: Every day | ORAL | Status: DC
  Administered 2014-10-04 – 2014-10-06 (×3): 325 mg via ORAL
  Filled 2014-10-03 (×4): qty 1

## 2014-10-03 MED ORDER — LACTATED RINGERS IV SOLN: 500.0000 mL | Freq: Once | INTRAVENOUS | Status: AC | PRN

## 2014-10-03 MED ORDER — TRAMADOL HCL 50 MG PO TABS
50.0000 mg | ORAL_TABLET | ORAL | Status: DC | PRN
  Administered 2014-10-05: 50 mg via ORAL
  Filled 2014-10-03: qty 1

## 2014-10-03 MED ORDER — LIDOCAINE HCL (CARDIAC) 20 MG/ML IV SOLN
INTRAVENOUS | Status: AC
  Filled 2014-10-03: qty 5

## 2014-10-03 MED ORDER — MAGNESIUM SULFATE 4 GM/100ML IV SOLN
4.0000 g | Freq: Once | INTRAVENOUS | Status: AC
  Administered 2014-10-03: 4 g via INTRAVENOUS
  Filled 2014-10-03: qty 100

## 2014-10-03 MED ORDER — PROTAMINE SULFATE 10 MG/ML IV SOLN
INTRAVENOUS | Status: DC | PRN
  Administered 2014-10-03: 300 mg via INTRAVENOUS

## 2014-10-03 MED ORDER — POTASSIUM CHLORIDE 10 MEQ/50ML IV SOLN
10.0000 meq | Freq: Once | INTRAVENOUS | Status: AC
  Administered 2014-10-03: 10 meq via INTRAVENOUS

## 2014-10-03 MED ORDER — 0.9 % SODIUM CHLORIDE (POUR BTL) OPTIME
TOPICAL | Status: DC | PRN
  Administered 2014-10-03: 6000 mL

## 2014-10-03 MED ORDER — ARTIFICIAL TEARS OP OINT
TOPICAL_OINTMENT | OPHTHALMIC | Status: AC
  Filled 2014-10-03: qty 3.5

## 2014-10-03 MED ORDER — ACETAMINOPHEN 500 MG PO TABS
1000.0000 mg | ORAL_TABLET | Freq: Four times a day (QID) | ORAL | Status: DC
  Administered 2014-10-04 – 2014-10-07 (×12): 1000 mg via ORAL
  Filled 2014-10-03 (×17): qty 2

## 2014-10-03 MED ORDER — SODIUM CHLORIDE 0.9 % IV SOLN
INTRAVENOUS | Status: DC
Start: 2014-10-03 — End: 2014-10-05

## 2014-10-03 MED ORDER — MORPHINE SULFATE 2 MG/ML IJ SOLN
1.0000 mg | INTRAMUSCULAR | Status: AC | PRN
  Administered 2014-10-03: 2 mg via INTRAVENOUS
  Administered 2014-10-03: 4 mg via INTRAVENOUS

## 2014-10-03 MED ORDER — SODIUM CHLORIDE 0.9 % IV SOLN
INTRAVENOUS | Status: DC
  Administered 2014-10-04: 2.5 [IU]/h via INTRAVENOUS
  Filled 2014-10-03 (×2): qty 2.5

## 2014-10-03 MED ORDER — PHENYLEPHRINE 40 MCG/ML (10ML) SYRINGE FOR IV PUSH (FOR BLOOD PRESSURE SUPPORT)
PREFILLED_SYRINGE | INTRAVENOUS | Status: AC
  Filled 2014-10-03: qty 10

## 2014-10-03 MED ORDER — NITROGLYCERIN IN D5W 200-5 MCG/ML-% IV SOLN: 0.0000 ug/min | INTRAVENOUS | Status: DC

## 2014-10-03 MED ORDER — HEPARIN SODIUM (PORCINE) 1000 UNIT/ML IJ SOLN
INTRAMUSCULAR | Status: DC | PRN
  Administered 2014-10-03: 28000 [IU] via INTRAVENOUS
  Administered 2014-10-03: 2000 [IU] via INTRAVENOUS
  Administered 2014-10-03: 5000 [IU] via INTRAVENOUS

## 2014-10-03 MED ORDER — FAMOTIDINE IN NACL 20-0.9 MG/50ML-% IV SOLN
20.0000 mg | Freq: Two times a day (BID) | INTRAVENOUS | Status: AC
  Administered 2014-10-03: 20 mg via INTRAVENOUS

## 2014-10-03 MED ORDER — ACETAMINOPHEN 160 MG/5ML PO SOLN: 650.0000 mg | Freq: Once | ORAL | Status: AC

## 2014-10-03 MED ORDER — ROCURONIUM BROMIDE 100 MG/10ML IV SOLN
INTRAVENOUS | Status: DC | PRN
  Administered 2014-10-03 (×3): 50 mg via INTRAVENOUS

## 2014-10-03 MED ORDER — MIDAZOLAM HCL 10 MG/2ML IJ SOLN
INTRAMUSCULAR | Status: AC
  Filled 2014-10-03: qty 2

## 2014-10-03 MED ORDER — METOPROLOL TARTRATE 1 MG/ML IV SOLN: 2.5000 mg | INTRAVENOUS | Status: DC | PRN

## 2014-10-03 MED ORDER — BISACODYL 10 MG RE SUPP: 10.0000 mg | Freq: Every day | RECTAL | Status: DC

## 2014-10-03 MED ORDER — MIDAZOLAM HCL 5 MG/5ML IJ SOLN
INTRAMUSCULAR | Status: DC | PRN
  Administered 2014-10-03: 1 mg via INTRAVENOUS
  Administered 2014-10-03: 2 mg via INTRAVENOUS
  Administered 2014-10-03: 5 mg via INTRAVENOUS
  Administered 2014-10-03: 2 mg via INTRAVENOUS

## 2014-10-03 MED ORDER — PHENYLEPHRINE HCL 10 MG/ML IJ SOLN
0.0000 ug/min | INTRAVENOUS | Status: DC
  Filled 2014-10-03: qty 2

## 2014-10-03 MED ORDER — SODIUM CHLORIDE 0.9 % IV SOLN: 250.0000 mL | INTRAVENOUS | Status: DC

## 2014-10-03 MED ORDER — PROTAMINE SULFATE 10 MG/ML IV SOLN
INTRAVENOUS | Status: AC
  Filled 2014-10-03: qty 25

## 2014-10-03 MED ORDER — FENTANYL CITRATE 0.05 MG/ML IJ SOLN
INTRAMUSCULAR | Status: DC | PRN
  Administered 2014-10-03: 250 ug via INTRAVENOUS
  Administered 2014-10-03: 100 ug via INTRAVENOUS
  Administered 2014-10-03 (×2): 250 ug via INTRAVENOUS
  Administered 2014-10-03: 50 ug via INTRAVENOUS
  Administered 2014-10-03 (×2): 100 ug via INTRAVENOUS
  Administered 2014-10-03: 250 ug via INTRAVENOUS
  Administered 2014-10-03: 50 ug via INTRAVENOUS
  Administered 2014-10-03: 250 ug via INTRAVENOUS
  Administered 2014-10-03: 100 ug via INTRAVENOUS
  Administered 2014-10-03: 50 ug via INTRAVENOUS
  Administered 2014-10-03 (×2): 100 ug via INTRAVENOUS

## 2014-10-03 MED ORDER — SUCCINYLCHOLINE CHLORIDE 20 MG/ML IJ SOLN
INTRAMUSCULAR | Status: AC
  Filled 2014-10-03: qty 1

## 2014-10-03 MED ORDER — ALBUMIN HUMAN 5 % IV SOLN
250.0000 mL | INTRAVENOUS | Status: AC | PRN
  Administered 2014-10-03 (×2): 250 mL via INTRAVENOUS

## 2014-10-03 MED ORDER — METOPROLOL TARTRATE 25 MG/10 ML ORAL SUSPENSION
12.5000 mg | Freq: Two times a day (BID) | ORAL | Status: DC
  Administered 2014-10-03: 12.5 mg
  Filled 2014-10-03 (×5): qty 5

## 2014-10-03 MED ORDER — DOCUSATE SODIUM 100 MG PO CAPS
200.0000 mg | ORAL_CAPSULE | Freq: Every day | ORAL | Status: DC
  Administered 2014-10-04 – 2014-10-05 (×2): 200 mg via ORAL
  Filled 2014-10-03 (×4): qty 2

## 2014-10-03 MED ORDER — FENTANYL CITRATE 0.05 MG/ML IJ SOLN
INTRAMUSCULAR | Status: AC
Start: 2014-10-03 — End: 2014-10-03
  Filled 2014-10-03: qty 5

## 2014-10-03 MED ORDER — LACTATED RINGERS IV SOLN
INTRAVENOUS | Status: DC | PRN
  Administered 2014-10-03 (×2): via INTRAVENOUS

## 2014-10-03 MED ORDER — CHLORHEXIDINE GLUCONATE 0.12 % MT SOLN: 15.0000 mL | Freq: Two times a day (BID) | OROMUCOSAL | Status: DC

## 2014-10-03 MED ORDER — SODIUM CHLORIDE 0.9 % IJ SOLN
OROMUCOSAL | Status: DC | PRN
  Administered 2014-10-03 (×3): 1000 mL via TOPICAL

## 2014-10-03 MED ORDER — MIDAZOLAM HCL 2 MG/2ML IJ SOLN: 2.0000 mg | INTRAMUSCULAR | Status: DC | PRN

## 2014-10-03 MED ORDER — LIDOCAINE HCL (CARDIAC) 20 MG/ML IV SOLN
INTRAVENOUS | Status: DC | PRN
  Administered 2014-10-03: 60 mg via INTRAVENOUS

## 2014-10-03 MED ORDER — ACETAMINOPHEN 160 MG/5ML PO SOLN
1000.0000 mg | Freq: Four times a day (QID) | ORAL | Status: DC
  Filled 2014-10-03: qty 40

## 2014-10-03 MED ORDER — POTASSIUM CHLORIDE 10 MEQ/50ML IV SOLN
10.0000 meq | INTRAVENOUS | Status: AC
  Administered 2014-10-03 (×3): 10 meq via INTRAVENOUS

## 2014-10-03 MED ORDER — VANCOMYCIN HCL IN DEXTROSE 1-5 GM/200ML-% IV SOLN
1000.0000 mg | Freq: Once | INTRAVENOUS | Status: AC
Start: 2014-10-03 — End: 2014-10-03
  Administered 2014-10-03: 1000 mg via INTRAVENOUS
  Filled 2014-10-03: qty 200

## 2014-10-03 MED ORDER — ACETAMINOPHEN 650 MG RE SUPP
650.0000 mg | Freq: Once | RECTAL | Status: AC
  Administered 2014-10-03: 650 mg via RECTAL

## 2014-10-03 MED ORDER — HEPARIN SODIUM (PORCINE) 1000 UNIT/ML IJ SOLN
INTRAMUSCULAR | Status: AC
  Filled 2014-10-03: qty 1

## 2014-10-03 MED ORDER — PROPOFOL 10 MG/ML IV BOLUS
INTRAVENOUS | Status: DC | PRN
  Administered 2014-10-03: 80 mg via INTRAVENOUS

## 2014-10-03 MED ORDER — LACTATED RINGERS IV SOLN
INTRAVENOUS | Status: DC | PRN
  Administered 2014-10-03 (×2): via INTRAVENOUS

## 2014-10-03 MED ORDER — MORPHINE SULFATE 2 MG/ML IJ SOLN
2.0000 mg | INTRAMUSCULAR | Status: DC | PRN
  Administered 2014-10-03 – 2014-10-04 (×5): 2 mg via INTRAVENOUS
  Filled 2014-10-03: qty 1
  Filled 2014-10-03: qty 2
  Filled 2014-10-03 (×5): qty 1

## 2014-10-03 MED ORDER — HEMOSTATIC AGENTS (NO CHARGE) OPTIME
TOPICAL | Status: DC | PRN
  Administered 2014-10-03: 1 via TOPICAL

## 2014-10-03 SURGICAL SUPPLY — 90 items
ATTRACTOMAT 16X20 MAGNETIC DRP (DRAPES) ×4 IMPLANT
BAG DECANTER FOR FLEXI CONT (MISCELLANEOUS) ×4 IMPLANT
BANDAGE ELASTIC 4 VELCRO ST LF (GAUZE/BANDAGES/DRESSINGS) ×4 IMPLANT
BANDAGE ELASTIC 6 VELCRO ST LF (GAUZE/BANDAGES/DRESSINGS) ×4 IMPLANT
BASKET HEART  (ORDER IN 25'S) (MISCELLANEOUS) ×1
BASKET HEART (ORDER IN 25'S) (MISCELLANEOUS) ×1
BASKET HEART (ORDER IN 25S) (MISCELLANEOUS) ×2 IMPLANT
BLADE STERNUM SYSTEM 6 (BLADE) ×4 IMPLANT
BNDG GAUZE ELAST 4 BULKY (GAUZE/BANDAGES/DRESSINGS) ×4 IMPLANT
CANISTER SUCTION 2500CC (MISCELLANEOUS) ×4 IMPLANT
CANNULA EZ GLIDE AORTIC 21FR (CANNULA) ×4 IMPLANT
CARDIAC SUCTION (MISCELLANEOUS) ×4 IMPLANT
CATH CPB KIT HENDRICKSON (MISCELLANEOUS) ×4 IMPLANT
CATH ROBINSON RED A/P 18FR (CATHETERS) ×4 IMPLANT
CATH THORACIC 36FR (CATHETERS) ×4 IMPLANT
CATH THORACIC 36FR RT ANG (CATHETERS) ×4 IMPLANT
CLIP TI MEDIUM 24 (CLIP) IMPLANT
CLIP TI WIDE RED SMALL 24 (CLIP) ×8 IMPLANT
COVER SURGICAL LIGHT HANDLE (MISCELLANEOUS) ×4 IMPLANT
CRADLE DONUT ADULT HEAD (MISCELLANEOUS) ×4 IMPLANT
DRAPE CARDIOVASCULAR INCISE (DRAPES) ×2
DRAPE SLUSH/WARMER DISC (DRAPES) ×4 IMPLANT
DRAPE SRG 135X102X78XABS (DRAPES) ×2 IMPLANT
DRSG COVADERM 4X14 (GAUZE/BANDAGES/DRESSINGS) ×4 IMPLANT
ELECT REM PT RETURN 9FT ADLT (ELECTROSURGICAL) ×8
ELECTRODE REM PT RTRN 9FT ADLT (ELECTROSURGICAL) ×4 IMPLANT
GAUZE SPONGE 4X4 12PLY STRL (GAUZE/BANDAGES/DRESSINGS) ×8 IMPLANT
GAUZE SPONGE 4X4 16PLY XRAY LF (GAUZE/BANDAGES/DRESSINGS) ×4 IMPLANT
GLOVE BIO SURGEON STRL SZ 6 (GLOVE) ×12 IMPLANT
GLOVE BIO SURGEON STRL SZ 6.5 (GLOVE) ×9 IMPLANT
GLOVE BIO SURGEON STRL SZ7.5 (GLOVE) ×8 IMPLANT
GLOVE BIO SURGEONS STRL SZ 6.5 (GLOVE) ×3
GLOVE BIOGEL PI IND STRL 7.0 (GLOVE) ×6 IMPLANT
GLOVE BIOGEL PI INDICATOR 7.0 (GLOVE) ×6
GLOVE SURG SIGNA 7.5 PF LTX (GLOVE) ×12 IMPLANT
GOWN STRL REUS W/ TWL LRG LVL3 (GOWN DISPOSABLE) ×14 IMPLANT
GOWN STRL REUS W/ TWL XL LVL3 (GOWN DISPOSABLE) ×4 IMPLANT
GOWN STRL REUS W/TWL LRG LVL3 (GOWN DISPOSABLE) ×14
GOWN STRL REUS W/TWL XL LVL3 (GOWN DISPOSABLE) ×4
HEMOSTAT POWDER SURGIFOAM 1G (HEMOSTASIS) ×12 IMPLANT
HEMOSTAT SURGICEL 2X14 (HEMOSTASIS) ×4 IMPLANT
INSERT FOGARTY XLG (MISCELLANEOUS) IMPLANT
KIT BASIN OR (CUSTOM PROCEDURE TRAY) ×4 IMPLANT
KIT ROOM TURNOVER OR (KITS) ×4 IMPLANT
KIT SUCTION CATH 14FR (SUCTIONS) ×8 IMPLANT
KIT VASOVIEW W/TROCAR VH 2000 (KITS) ×4 IMPLANT
MARKER GRAFT CORONARY BYPASS (MISCELLANEOUS) ×12 IMPLANT
NS IRRIG 1000ML POUR BTL (IV SOLUTION) ×24 IMPLANT
PACK OPEN HEART (CUSTOM PROCEDURE TRAY) ×4 IMPLANT
PAD ARMBOARD 7.5X6 YLW CONV (MISCELLANEOUS) ×8 IMPLANT
PAD ELECT DEFIB RADIOL ZOLL (MISCELLANEOUS) ×4 IMPLANT
PENCIL BUTTON HOLSTER BLD 10FT (ELECTRODE) ×4 IMPLANT
PUNCH AORTIC ROTATE 4.0MM (MISCELLANEOUS) ×4 IMPLANT
PUNCH AORTIC ROTATE 4.5MM 8IN (MISCELLANEOUS) IMPLANT
PUNCH AORTIC ROTATE 5MM 8IN (MISCELLANEOUS) IMPLANT
SET CARDIOPLEGIA MPS 5001102 (MISCELLANEOUS) ×4 IMPLANT
SPONGE GAUZE 4X4 12PLY STER LF (GAUZE/BANDAGES/DRESSINGS) ×4 IMPLANT
SUT BONE WAX W31G (SUTURE) ×4 IMPLANT
SUT MNCRL AB 4-0 PS2 18 (SUTURE) IMPLANT
SUT PROLENE 3 0 SH DA (SUTURE) ×4 IMPLANT
SUT PROLENE 4 0 RB 1 (SUTURE)
SUT PROLENE 4 0 SH DA (SUTURE) IMPLANT
SUT PROLENE 4-0 RB1 .5 CRCL 36 (SUTURE) IMPLANT
SUT PROLENE 6 0 C 1 30 (SUTURE) ×8 IMPLANT
SUT PROLENE 7 0 BV 1 (SUTURE) ×12 IMPLANT
SUT PROLENE 7 0 BV1 MDA (SUTURE) ×8 IMPLANT
SUT PROLENE 8 0 BV175 6 (SUTURE) ×8 IMPLANT
SUT STEEL 6MS V (SUTURE) ×4 IMPLANT
SUT STEEL STERNAL CCS#1 18IN (SUTURE) IMPLANT
SUT STEEL SZ 6 DBL 3X14 BALL (SUTURE) ×4 IMPLANT
SUT VIC AB 1 CTX 36 (SUTURE) ×4
SUT VIC AB 1 CTX36XBRD ANBCTR (SUTURE) ×4 IMPLANT
SUT VIC AB 2-0 CT1 27 (SUTURE) ×2
SUT VIC AB 2-0 CT1 TAPERPNT 27 (SUTURE) ×2 IMPLANT
SUT VIC AB 2-0 CTX 27 (SUTURE) IMPLANT
SUT VIC AB 3-0 SH 27 (SUTURE)
SUT VIC AB 3-0 SH 27X BRD (SUTURE) IMPLANT
SUT VIC AB 3-0 X1 27 (SUTURE) ×4 IMPLANT
SUT VICRYL 4-0 PS2 18IN ABS (SUTURE) IMPLANT
SUTURE E-PAK OPEN HEART (SUTURE) ×4 IMPLANT
SYSTEM SAHARA CHEST DRAIN ATS (WOUND CARE) ×4 IMPLANT
TAPE CLOTH SURG 4X10 WHT LF (GAUZE/BANDAGES/DRESSINGS) ×4 IMPLANT
TAPE PAPER 2X10 WHT MICROPORE (GAUZE/BANDAGES/DRESSINGS) ×4 IMPLANT
TOWEL OR 17X24 6PK STRL BLUE (TOWEL DISPOSABLE) ×8 IMPLANT
TOWEL OR 17X26 10 PK STRL BLUE (TOWEL DISPOSABLE) ×8 IMPLANT
TRAY FOLEY IC TEMP SENS 16FR (CATHETERS) ×4 IMPLANT
TUBE FEEDING 8FR 16IN STR KANG (MISCELLANEOUS) ×4 IMPLANT
TUBING INSUFFLATION (TUBING) ×4 IMPLANT
UNDERPAD 30X30 INCONTINENT (UNDERPADS AND DIAPERS) ×4 IMPLANT
WATER STERILE IRR 1000ML POUR (IV SOLUTION) ×8 IMPLANT

## 2014-10-03 NOTE — Plan of Care (Signed)
Problem: Phase II - Intermediate Post-Op Goal: Maintain Hemodynamic Stability Outcome: Completed/Met Date Met:  10/03/14

## 2014-10-03 NOTE — Progress Notes (Signed)
  Echocardiogram 2D Echocardiogram has been performed.  Miguel Vasquez 10/03/2014, 9:28 AM

## 2014-10-03 NOTE — Brief Op Note (Addendum)
      [address]       [place] [address]             [phone]     09/28/2014 - 10/03/2014  11:46 AM  PATIENT:  Miguel Vasquez  68 y.o. male  PRE-OPERATIVE DIAGNOSIS:  LEFT MAIN/ 3 VESSEL CAD  POST-OPERATIVE DIAGNOSIS:  LEFT MAIN/ 3 VESSEL CAD  PROCEDURE:  Procedure(s): CORONARY ARTERY BYPASS GRAFTING (CABG) x  3   LIMA to LAD  SVG to OM  SVG to PL EVH RIGHT THIGH TRANSESOPHAGEAL ECHOCARDIOGRAM (TEE)  SURGEON:  Surgeon(s): Melrose Nakayama, MD  PHYSICIAN ASSISTANT: WAYNE GOLD PA-C  ANESTHESIA:   general  PATIENT CONDITION:  ICU - intubated and hemodynamically stable.  PRE-OPERATIVE WEIGHT: 99kg  EBL: SEE ANEST/PERFUSION RECORDS  COMPLICATIONS: NO KNOWN  XC= 69 min CPB= 112 min  Good targets and conduits Calcific plaque in ascending aorta TEE- preserved LV function, mild AI

## 2014-10-03 NOTE — Anesthesia Procedure Notes (Signed)
Procedure Name: Intubation Date/Time: 10/03/2014 9:15 AM Performed by: Maude Leriche D Pre-anesthesia Checklist: Patient identified, Emergency Drugs available, Suction available, Patient being monitored and Timeout performed Patient Re-evaluated:Patient Re-evaluated prior to inductionOxygen Delivery Method: Circle system utilized Preoxygenation: Pre-oxygenation with 100% oxygen Intubation Type: IV induction Ventilation: Mask ventilation without difficulty Laryngoscope Size: Miller and 2 Grade View: Grade I Tube type: Oral Tube size: 8.0 mm Number of attempts: 1 Airway Equipment and Method: Stylet Placement Confirmation: ETT inserted through vocal cords under direct vision,  positive ETCO2 and breath sounds checked- equal and bilateral Secured at: 23 cm Tube secured with: Tape Dental Injury: Teeth and Oropharynx as per pre-operative assessment

## 2014-10-03 NOTE — Interval H&P Note (Signed)
History and Physical Interval Note: Patient seen and examined He is aware of the risks and benefits of CABG for CAD He accepts the risks and agrees to proceed  10/03/2014 7:58 AM  Miguel Vasquez  has presented today for surgery, with the diagnosis of CAD  The various methods of treatment have been discussed with the patient and family. After consideration of risks, benefits and other options for treatment, the patient has consented to  Procedure(s): CORONARY ARTERY BYPASS GRAFTING (CABG) (N/A) TRANSESOPHAGEAL ECHOCARDIOGRAM (TEE) (N/A) as a surgical intervention .  The patient's history has been reviewed, patient examined, no change in status, stable for surgery.  I have reviewed the patient's chart and labs.  Questions were answered to the patient's satisfaction.     HENDRICKSON,STEVEN C

## 2014-10-03 NOTE — Procedures (Signed)
Extubation Procedure Note  Patient Details:   Name: Miguel Vasquez DOB: 06-28-1946 MRN: [id number]   Airway Documentation:  Airway 8 mm (Active)  Secured at (cm) 22 cm 10/03/2014  1:55 PM  Measured From Lips 10/03/2014  1:55 PM  Secured Location Right 10/03/2014  1:55 PM  Secured By Pink Tape 10/03/2014  1:55 PM  Site Condition Dry 10/03/2014  1:55 PM    Evaluation  O2 sats: stable throughout Complications: No apparent complications Patient did tolerate procedure well.     Yes  Blanchie Serve 10/03/2014, 7:17 PM

## 2014-10-03 NOTE — Transfer of Care (Signed)
Immediate Anesthesia Transfer of Care Note  Patient: Miguel Vasquez  Procedure(s) Performed: Procedure(s): CORONARY ARTERY BYPASS GRAFTING (CABG) x  three, using left internal mammary artery and right leg greater saphenous vein harvested endoscopically (N/A) TRANSESOPHAGEAL ECHOCARDIOGRAM (TEE) (N/A)  Patient Location: Nursing Unit  Anesthesia Type:General  Level of Consciousness: sedated  Airway & Oxygen Therapy: Patient remains intubated per anesthesia plan and Patient placed on Ventilator (see vital sign flow sheet for setting)  Post-op Assessment: Report given to PACU RN and Post -op Vital signs reviewed and stable  Post vital signs: Reviewed and stable  Complications: No apparent anesthesia complications

## 2014-10-03 NOTE — OR Nursing (Signed)
13:15 - 2nc call to SICU

## 2014-10-03 NOTE — Progress Notes (Signed)
Patient was extubated with no complications. Patient is able to couch and speak. Patient's NIF was -40 and VC was 3L. Patient was placed on a 2LNC and sats are 99%.

## 2014-10-03 NOTE — Progress Notes (Signed)
Patient ID: Miguel Vasquez, male   DOB: 15-Oct-1946, 68 y.o.   MRN: [id number]   SICU Evening Rounds:   Hemodynamically stable  CI = 2.5  Vent is on CPAP  Urine output good  CT output low  CBC    Component Value Date/Time   WBC 4.1 10/03/2014 1410   RBC 3.64* 10/03/2014 1410   HGB 10.9* 10/03/2014 1416   HCT 32.0* 10/03/2014 1416   PLT 79* 10/03/2014 1410   MCV 91.2 10/03/2014 1410   MCH 31.3 10/03/2014 1410   MCHC 34.3 10/03/2014 1410   RDW 12.4 10/03/2014 1410   LYMPHSABS 1.6 09/27/2014 1439   MONOABS 0.3 09/27/2014 1439   EOSABS 0.1 09/27/2014 1439   BASOSABS 0.0 09/27/2014 1439     BMET    Component Value Date/Time   NA 137 10/03/2014 1416   K 3.6* 10/03/2014 1416   CL 103 10/03/2014 1248   CO2 25 10/03/2014 0325   GLUCOSE 124* 10/03/2014 1416   BUN 8 10/03/2014 1248   CREATININE 0.60 10/03/2014 1248   CREATININE 0.86 09/27/2014 1439   CALCIUM 9.8 10/03/2014 0325   GFRNONAA 88* 10/03/2014 0325   GFRAA >90 10/03/2014 0325     A/P:  Stable postop course. Continue current plans. Wean to extubate.

## 2014-10-03 NOTE — Progress Notes (Signed)
Attempted to wean at this time, pt would wake up but then fell back asleep and would not breathe over a rate of 4. RT will try again when pt more awake.

## 2014-10-03 NOTE — OR Nursing (Signed)
12:45 - 1st call to SICU

## 2014-10-03 NOTE — Anesthesia Postprocedure Evaluation (Signed)
  Anesthesia Post-op Note  Patient: Miguel Vasquez  Procedure(s) Performed: Procedure(s): CORONARY ARTERY BYPASS GRAFTING (CABG) x  three, using left internal mammary artery and right leg greater saphenous vein harvested endoscopically (N/A) TRANSESOPHAGEAL ECHOCARDIOGRAM (TEE) (N/A)  Patient Location: PACU and SICU  Anesthesia Type:General  Level of Consciousness: sedated  Airway and Oxygen Therapy: Patient remains intubated per anesthesia plan  Post-op Pain: mild  Post-op Assessment: Post-op Vital signs reviewed  Post-op Vital Signs: Reviewed  Last Vitals:  Filed Vitals:   10/03/14 1827  BP: 98/59  Pulse: 90  Temp:   Resp: 13    Complications: No apparent anesthesia complications

## 2014-10-03 NOTE — H&P (View-Only) (Signed)
[address]       [place] [address]             [phone]        Miguel Vasquez [facility] #[id number] Date of Birth: [birth year]  Referring: No ref. provider found Primary Care: Coy Saunas, MD  Chief Complaint: Multivessel CAD History of Present Illness:    68 year old male with a history od CAD s/p stenting of left Circumflex in 1996 and RCA in 2002. Recently he was see by PCP with symptoms of chest pain similar to previous pain when stents were placed. He was treated initially with PPI sith some improvement. He was also due for exercise myoview and this was performed and findings were suggestive of ischemia. Cardiac catheterization was therefore performed and findings include severe multivessel CAD . We are consulted for surgical opinion for consideration of CABG.    Current Activity/ Functional Status: Patient is independent with mobility/ambulation, transfers, ADL's, IADL's.   Zubrod Score: At the time of surgery this patient's most appropriate activity status/level should be described as: [x]     0    Normal activity, no symptoms []     1    Restricted in physical strenuous activity but ambulatory, able to do out light work []     2    Ambulatory and capable of self care, unable to do work activities, up and about                 more than 50%  Of the time                            []     3    Only limited self care, in bed greater than 50% of waking hours []     4    Completely disabled, no self care, confined to bed or chair []     5    Moribund  Past Medical History  Diagnosis Date  . Hypercholesterolemia   . Coronary disease     Status post stenting of the left circumflex coronary in 1996--status post stenting of the right coronary in 2002  . Hypertension     Controlled  . Diabetes mellitus     Type 2  . Hemochromatosis   . Obesity   . History of ulcer disease   . Panic disorder     Hx of panic disorder  . Cervical disc  disease     History of Cervical Disc Disease  . Myocardial infarction 2002  hypothyroidism  Past Surgical History  Procedure Laterality Date  . 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 Former Smoker  . Start date: 09/20/1995  Smokeless tobacco  . Not on file    History  Alcohol Use  . Yes    History   Social History  . Marital Status: Married    Spouse Name: N/A    Number of Children: 1  . Years of Education: N/A   Occupational History  . inspector [employer]    Social History Main Topics  . Smoking status: Former Smoker    Start date: 09/20/1995  . Smokeless tobacco: Not on file  . Alcohol Use: Yes  . Drug Use: Not on  file  . Sexual Activity: Not on file   Other Topics Concern  . Not on file   Social History Narrative    Allergies  Allergen Reactions  . Statins Other (See Comments)    "Affects liver enzymes"    Current Facility-Administered Medications  Medication Dose Route Frequency Provider Last Rate Last Dose  . 0.9 %  sodium chloride infusion   Intravenous Continuous Miguel Sine, MD 150 mL/hr at 09/28/14 1600 150 mL/hr at 09/28/14 1600  . acetaminophen (TYLENOL) tablet 650 mg  650 mg Oral Q4H PRN Miguel Sine, MD      . Derrill Memo ON 09/29/2014] allopurinol (ZYLOPRIM) tablet 150 mg  150 mg Oral Daily Miguel Sine, MD      . Derrill Memo ON 09/29/2014] aspirin EC tablet 81 mg  81 mg Oral Daily Miguel Sine, MD      . atenolol (TENORMIN) tablet 25 mg  25 mg Oral BID Miguel Sine, MD      . cholestyramine light (PREVALITE) packet 4 g  4 g Oral BID Miguel Sine, MD      . Derrill Memo ON 09/29/2014] ezetimibe (ZETIA) tablet 10 mg  10 mg Oral Daily Miguel Sine, MD      . Derrill Memo ON 09/29/2014] fenofibrate tablet 160 mg  160 mg Oral Daily Miguel Sine, MD      . heparin ADULT infusion 100  units/mL (25000 units/250 mL)  1,350 Units/hr Intravenous Continuous Miguel Vasquez, RPH      . [START ON 09/29/2014] isosorbide mononitrate (IMDUR) 24 hr tablet 30 mg  30 mg Oral Daily Miguel Sine, MD      . Derrill Memo ON 09/29/2014] levothyroxine (SYNTHROID, LEVOTHROID) tablet 100 mcg  100 mcg Oral QAC breakfast Miguel Sine, MD      . metFORMIN (GLUCOPHAGE) tablet 500 mg  500 mg Oral BID WC Miguel Sine, MD      . ondansetron [facility]) injection 4 mg  4 mg Intravenous Q6H PRN Miguel Sine, MD      . pantoprazole (PROTONIX) EC tablet 40 mg  40 mg Oral Daily Miguel Sine, MD      . PARoxetine (PAXIL) tablet 20 mg  20 mg Oral Daily Miguel Sine, MD      . Derrill Memo ON 09/29/2014] ramipril (ALTACE) capsule 10 mg  10 mg Oral Daily Miguel Sine, MD        Prescriptions prior to admission  Medication Sig Dispense Refill Last Dose  . allopurinol (ZYLOPRIM) 300 MG tablet Take 150 mg by mouth daily.    09/28/2014 at Unknown time  . aspirin 81 MG tablet Take 81 mg by mouth daily.     09/28/2014 at Unknown time  . atenolol (TENORMIN) 25 MG tablet Take 25 mg by mouth 2 (two) times daily.    09/28/2014 at Unknown time  . cholestyramine light (PREVALITE) 4 G packet Take 1 packet (4 g total) by mouth 2 (two) times daily.   09/26/2014 at Unknown time  . ezetimibe (ZETIA) 10 MG tablet Take 10 mg by mouth daily.     09/28/2014 at Unknown time  . fenofibrate (TRICOR) 145 MG tablet Take 1 tablet (145 mg total) by mouth daily.   09/28/2014 at Unknown time  . FOLIC ACID PO Take 703 mg by mouth daily.    09/28/2014 at Unknown time  . isosorbide mononitrate (IMDUR) 30 MG 24 hr tablet Take 1 tablet (30 mg total) by mouth daily.  30 tablet 6   . levothyroxine (SYNTHROID, LEVOTHROID) 100 MCG tablet Take 100 mcg by mouth daily.     09/28/2014 at Unknown time  . metFORMIN (GLUCOPHAGE) 500 MG tablet Take 500 mg by mouth 2 (two) times daily with a meal.    09/27/2014 at Unknown time  . pantoprazole (PROTONIX) 40 MG  tablet Take 1 tablet (40 mg total) by mouth daily. 30 tablet 11 09/27/2014 at Unknown time  . PARoxetine (PAXIL) 20 MG tablet Take 20 mg by mouth daily.     09/26/2014 at Unknown time  . ramipril (ALTACE) 10 MG capsule Take 10 mg by mouth daily.     09/28/2014 at Unknown time    Family History  Problem Relation Age of Onset  . Heart attack Sister   . Cancer Mother     Colon Cancer  . Heart failure Mother   . Cancer Brother     Colon Cancer     Review of Systems:     Cardiac Review of Systems: Y or N  Chest Pain [  y  ]  Resting SOB [ n  ] Exertional SOB  [n  ]  Orthopnea [n  ]   Pedal Edema [n   ]    Palpitations Florencio.Farrier  ] Syncope  Florencio.Farrier  ]   Prensyncope Florencio.Farrier   ]  General Review of Systems: [Y] = yes [  ]=no Constitional: recent weight change Florencio.Farrier  ]; anorexia [ n ]; fatigue [ n ]; nausea [ n ]; night sweats [n  ]; fever [ n ]; or chills [n  ]                                                               Dental: poor dentition[n  ]; Last Dentist visit: this year  Eye : blurred vision [n  ]; diplopia [ n  ]; vision changes [n  ];  Amaurosis fugax[n  ]; Resp: cough [n  ];  wheezing[n  ];  hemoptysis[n  ]; shortness of breath[ n ]; paroxysmal nocturnal dyspnea[n  ]; dyspnea on exertion[n  ]; or orthopnea[n  ];  GI:  gallstones[n  ], vomiting[ n ];  dysphagia[n  ]; melena[ n ];  hematochezia [n  ]; heartburn[y  ];   Hx of  Colonoscopy[ y ]; GU: kidney stones [n  ]; hematuria[n  ];   dysuria [ n ];  nocturia[ y ];  history of     obstruction [n  ]; urinary frequency [ n ]             Skin: rash, swelling[ n ];, hair loss[n  ];  peripheral edema[n  ];  or itching[ n ]; Musculosketetal: myalgias[ n ];  joint swelling[n  ];  joint erythema[n  ];  joint pain[n  ];  back pain[ y ];  Heme/Lymph: bruising[n  ];  bleeding[ n ];  anemia[ n ];  Neuro: TIA[ n ];  headaches[ n ];  stroke[ n ];  vertigo[  ];  seizures[n  ];   paresthesias[ n ];  difficulty walking[n  ];  Psych:depression[ n ]; anxiety[n  ];+  insomnia  Endocrine: diabetes[y ];  thyroid dysfunction[y  ];  Immunizations: Flu [  ]; Pneumococcal[  ];  Other:  Physical  Exam: BP 159/135 mmHg  Pulse 57  Resp 14  SpO2 100%  General appearance: alert, cooperative and no distress Neurologic: intact Heart: regular rate and rhythm, S1, S2 normal, no murmur, click, rub or gallop Lungs: clear to auscultation bilaterally Abdomen: soft, non-tender; bowel sounds normal; no masses,  no organomegaly Extremities: extremities normal, atraumatic, no cyanosis or edema HEENT: PEERL, EOMI, ANICT, PHARYNX CLEAR NECK : Supple, no bruits, no thyromegally, noo lymphadenopathy Pulses: intact Shin: no rashes or lesions  Diagnostic Studies & Laboratory data:     Recent Radiology Findings:   Dg Chest 2 View  09/28/2014   CLINICAL DATA:  Pain above the manubrium for 2 weeks. The patient is pre cardiac catheterization.  EXAM: CHEST  2 VIEW  COMPARISON:  October 23, 2008  FINDINGS: The heart size and mediastinal contours are within normal limits. There is no focal infiltrate, pulmonary edema, or pleural effusion. The visualized skeletal structures are unremarkable.  IMPRESSION: No active cardiopulmonary disease.   Electronically Signed   By: Abelardo Diesel M.D.   On: 09/28/2014 14:14      DATE OF PROCEDURE: 09/28/2014     CARDIAC CATHETERIZATION    HISTORY:   [name] is a 68 year old patient of Dr. Peter Martinique who is status post stenting of his left circumflex coronary artery in 1996 and right coronary artery in 2002. He has a total of 3 stents. He has a history of significant statin intolerance, elevated CPK levels on Lipitor. He has been unsteady a with continued high lipid studies. He recently had noticed some episodes of chest discomfort which he felt was due to reflux. The patient was referred for a three-year follow-up nuclear perfusion study was abnormal and revealed a moderate size and intensity, reversible inferior defect  compatible with ischemia. He developed 2.5 mm horizontal ST segment depression inferolaterally. He now is referred for definitive repeat cardiac catheterization.     PROCEDURE: Left heart catheterization via the right radial artery: Coronary angiography, left ventriculography.  The patient was brought to the second floor [facility] cath lab in the postabsorptive state. The patient was premedicated with Versed 2 mg and fentanyl 50 mcg. A right radial approach was utilized after an Allen's test verified adequate circulation. The right radial artery was punctured via the Seldinger technique, and a 6 [place] Glidesheath Slender was inserted without difficulty. A radial cocktail consisting of Verapamil, IV nitroglycerin, and lidocaine was administered. Weight adjusted heparin was administered. A safety J wire was advanced into the ascending aorta. Diagnostic catheterization was done with 5 French TIG 4.0, FL3.5, ERADL and ultimately EBU 3.5 catheters. A 5 French pigtail catheter was used for left ventriculography. A TR radial band was applied for hemostasis. The patient left the catheterization laboratory in stable condition.   HEMODYNAMICS:   Central Aorta: 135/88  Left Ventricle: 135/14  ANGIOGRAPHY:   Fluoroscopy revealed significant coronary calcification of all vessels.  There was significant calcification at the origin and proximal Left Main coronary artery with a 60-70% ostial narrowing. The left main bifurcated into the LAD and left circumflex vessel..   The LAD was a moderate size vessel that had 50% proximal stenosis in the region of the first diagonal vessel and another region of at least 50% stenosis in its midsegment. The LAD extended to the LV apex.   The left circumflex coronary  artery was a moderate size vessel. There appeared to be a stent in its midsegment. In the region proximal to the first marginal  branch. There was mild narrowing of 40% in this segment.   The RCA was stents of the calcified. There was significant ostial calcification and evidence for a 95% ostial calcified stenosis. Beyond this in the proximal segment was an area of haziness with 90% stenosis in the region of calcification with possible thrombus. There appeared to be admitted RCA stent that had narrowing of 40%. There also appeared to be a distal RCA stent proximal to the PDA takeoff which was patent. The RCA was a dominant vessel.   Left ventriculography revealed preserved global LV contractility with suggestion of mid to basal mild inferior hypocontractility. Ejection fraction is 55%.    Total contrast used: 135 cc Omipaque   IMPRESSION:  Severe coronary calcification and multivessel CAD with 60-70% ostial left main stenosis; segmental 50% proximal and mid LAD stenoses; 40% narrowing within the stented segment in the left circumflex coronary artery; and significant ostial RCA calcification with 95% ostial stenosis followed by a 90% proximal stenosis. An area of calcification with probable thrombus, and 40% mid RCA stenosis and a previously placed stent with a patent distal RCA stent.  Normal LV contractility with an ejection fraction of 55% and evidence for mild mid to basal inferior hypocontractility.   RECOMMENDATION:  Surgical consultation for consideration of CABG revascularization surgery.   Miguel Sine, MD, [facility] 09/28/2014 3:40 PM  Recent Lab Findings: Lab Results  Component Value Date   WBC 4.7 09/27/2014   HGB 14.2 09/27/2014   HCT 40.9 09/27/2014   PLT 166 09/27/2014   GLUCOSE 114* 09/27/2014   NA 138 09/27/2014   K 4.2 09/27/2014   CL 106 09/27/2014   CREATININE 0.86 09/27/2014   BUN 13 09/27/2014   CO2 23 09/27/2014   INR 1.05 09/27/2014       Assessment / Plan:  Severe multivessel CAD, recommend CABG    GOLD,WAYNE E, PA-C   Patient examined, coronary angiogram and 2-D echocardiogram reviewed. Patient with history of coronary stents placed> 10 years ago with recurrent anginal symptoms occurring with activity. After a stress test he underwent catheterization which demonstrated significant progression of disease--95% ostial RCA stenosis,, ostial left main 75% stenosis with 50% in-stent stenosis of the LAD stent. LV function is well-preserved. Patient in sinus rhythm. Chest x-rays clear. Carotid Dopplers have been completed and results are pending.  The patient has good peripheral pulses and lungs are clear to auscultation  The patient is scheduled for multivessel CABG later this week-tentatively scheduled for Wednesday a.m. with Dr. Roxan Hockey. Agree with plan to leave the patient hospitalized on IV heparin due to the RCA thrombus and left main stenosis. I discussed the procedure-plan for surgery with the patient and he is in agreement Ivin Poot  MD

## 2014-10-03 NOTE — Anesthesia Preprocedure Evaluation (Addendum)
Anesthesia Evaluation  Patient identified by MRN, date of birth, ID band Patient awake    Reviewed: Allergy & Precautions, H&P , NPO status   Airway Mallampati: II  TM Distance: >3 FB Neck ROM: Full    Dental  (+) Teeth Intact, Dental Advisory Given   Pulmonary neg pulmonary ROS, former smoker,  breath sounds clear to auscultation        Cardiovascular hypertension, + angina + CAD and + Past MI Rhythm:Regular Rate:Normal     Neuro/Psych    GI/Hepatic negative GI ROS, Neg liver ROS,   Endo/Other  diabetesHypothyroidism   Renal/GU negative Renal ROS     Musculoskeletal   Abdominal   Peds  Hematology   Anesthesia Other Findings   Reproductive/Obstetrics                           Anesthesia Physical Anesthesia Plan  ASA: III  Anesthesia Plan: General   Post-op Pain Management:    Induction: Intravenous  Airway Management Planned: Oral ETT  Additional Equipment: PA Cath, Arterial line and TEE  Intra-op Plan:   Post-operative Plan: Extubation in OR  Informed Consent: I have reviewed the patients History and Physical, chart, labs and discussed the procedure including the risks, benefits and alternatives for the proposed anesthesia with the patient or authorized representative who has indicated his/her understanding and acceptance.   Dental advisory given  Plan Discussed with: CRNA and Anesthesiologist  Anesthesia Plan Comments:        Anesthesia Quick Evaluation

## 2014-10-03 NOTE — Progress Notes (Signed)
SUBJECTIVE:  No acute events overnight. Telemetry reveals SB/NSR. He is scheduled for CABG this morning.     OBJECTIVE:   Vitals:   Filed Vitals:   10/03/14 0438 10/03/14 0441 10/03/14 0557 10/03/14 0558  BP:  132/74    Pulse:    55  Temp: 97.5 F (36.4 C)     TempSrc: Oral     Resp:  12 18   Height:      Weight:      SpO2: 98%      I&O's:    Intake/Output Summary (Last 24 hours) at 10/03/14 0651 Last data filed at 10/03/14 0600  Gross per 24 hour  Intake    280 ml  Output   1600 ml  Net  -1320 ml   TELEMETRY: Reviewed telemetry pt in SB/NSR:     PHYSICAL EXAM General: Well developed, well nourished, in no acute distress Head:   Normal cephalic and atramatic  Lungs:  Clear bilaterally to auscultation. Heart:   HRRR S1 S2  No JVD.   Abdomen: Abdomen soft and non-tender Msk:  Back normal,  Normal strength and tone for age. Extremities:  No edema.   Neuro: Alert and oriented. Psych:  Normal affect, responds appropriately Skin: No rash   LABS: Basic Metabolic Panel:  Recent Labs  10/02/14 0214 10/03/14 0325  NA 140 141  K 4.8 4.7  CL 101 103  CO2 24 25  GLUCOSE 132* 120*  BUN 14 12  CREATININE 0.94 0.88  CALCIUM 9.6 9.8  MG 2.1  --    Liver Function Tests:  Recent Labs  10/02/14 0214 10/03/14 0325  AST 76* 97*  ALT 80* 104*  ALKPHOS 47 49  BILITOT 0.7 0.6  PROT 6.9 7.0  ALBUMIN 4.1 4.1   No results for input(s): LIPASE, AMYLASE in the last 72 hours. CBC:  Recent Labs  10/02/14 0214 10/03/14 0325  WBC 3.9* 4.0  HGB 13.5 14.2  HCT 41.2 42.7  MCV 94.9 92.8  PLT 129* 142*   Cardiac Enzymes:  Recent Labs  10/02/14 0214  CKTOTAL 175  TROPONINI <0.30   BNP: Invalid input(s): POCBNP D-Dimer: No results for input(s): DDIMER in the last 72 hours. Hemoglobin A1C: No results for input(s): HGBA1C in the last 72 hours. Fasting Lipid Panel: No results for input(s): CHOL, HDL, LDLCALC, TRIG, CHOLHDL, LDLDIRECT in the last 72  hours. Thyroid Function Tests: No results for input(s): TSH, T4TOTAL, T3FREE, THYROIDAB in the last 72 hours.  Invalid input(s): FREET3 Anemia Panel: No results for input(s): VITAMINB12, FOLATE, FERRITIN, TIBC, IRON, RETICCTPCT in the last 72 hours. Coag Panel:   Lab Results  Component Value Date   INR 1.07 10/03/2014   INR 1.05 09/27/2014    RADIOLOGY: Dg Chest 2 View  09/28/2014   CLINICAL DATA:  Pain above the manubrium for 2 weeks. The patient is pre cardiac catheterization.  EXAM: CHEST  2 VIEW  COMPARISON:  October 23, 2008  FINDINGS: The heart size and mediastinal contours are within normal limits. There is no focal infiltrate, pulmonary edema, or pleural effusion. The visualized skeletal structures are unremarkable.  IMPRESSION: No active cardiopulmonary disease.   Electronically Signed   By: Abelardo Diesel M.D.   On: 09/28/2014 14:14   Principal Problem:   Unstable angina Active Problems:   Hypercholesterolemia   Coronary disease   Hypertension   Diabetes mellitus   Transaminitis    ASSESSMENT:  68 yo man with pmh of HTN, T2DM, hypothyroidism,  and CAD s/p 2 stents (1996 & 2002) who presented with abnormal Myoview exercise test found to have severe multivessel CAD requiring CABG.   PLAN:    Unstable Angina in setting of multivessel CAD s/p DES x 2 to left circumflex & RCA (1996 & 2002) - Currently CP-free with negative cardiac enzyme x 1. Pt with left cardiac catherization on 09/2014 with 60-70% ostial left main stenosis and 40% mid RCA stenosis with probable thrombus. Pt scheduled for CABG with Dr. Roxan Hockey today. Continue IV heparin and home 81 mg daily aspirin, atenolol 25 mg BID, imdur 30 mg daily, ramipril 10 mg daily, and anti-lipid medications. Due to unstable angina, pt does not need dual AP following CABG. Continue cardiac rehab.  Hypertension - Currently normotensive. Continue home atenolol 25 mg BID, imdur 30 mg daily, and ramipril 10 mg daily.  Hyperlipidemia  - Pt reports intolerance to statin therapy. Last lipid panel on 08/06/14 with LDL 145 not at goal <70-100. TG 357 and total cholesterol 257. He is currently on home fenofibrate 160 mg daily, cholestyramine 4 g BID, zetia 10 mg daily. Pt needs referral to lipid clinic for consideration of PCSK9 inhibitor.  CK level normal. Acute thrombocytopenia - Platelet count improved to 142K. Currently on IV heparin with no active bleeding. Continue to monitor.    Transaminitis - AST 97 ALT 104 with no prior abdominal imaging. F/u acute hepatitis panel. Needs outpatient follow-up. Non-insulin dependent Type II DM - Last A1c 6 on 08/13/14, f/u A1c. Hold home metformin 500 mg BID. Currently on moderate SSI. Hypothyroidism - Last TSH and free T4 normal on 08/06/14. Continue home synthroid 100 mcg daily.  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 Continue home allopurinol 150 mg daily.  Depression - Continue home paroxetine 20 mg daily.     Juluis Mire, MD  PGY-II IMTS Pager: [phone] 10/03/2014  6:51 AM  Attending note by Dr. Percival Spanish to follow

## 2014-10-04 ENCOUNTER — Inpatient Hospital Stay (HOSPITAL_COMMUNITY): Payer: Medicare HMO

## 2014-10-04 LAB — BASIC METABOLIC PANEL
ANION GAP: 13 (ref 5–15)
BUN: 9 mg/dL (ref 6–23)
CHLORIDE: 104 meq/L (ref 96–112)
CO2: 20 meq/L (ref 19–32)
CREATININE: 0.75 mg/dL (ref 0.50–1.35)
Calcium: 8.4 mg/dL (ref 8.4–10.5)
GFR calc Af Amer: >90 mL/min (ref >90)
GFR calc non Af Amer: >90 mL/min (ref >90)
Glucose, Bld: 127 mg/dL — ABNORMAL HIGH (ref 70–99)
Potassium: 4 mEq/L (ref 3.7–5.3)
Sodium: 137 mEq/L (ref 137–147)

## 2014-10-04 LAB — GLUCOSE, CAPILLARY
GLUCOSE-CAPILLARY: 114 mg/dL — AB (ref 70–99)
GLUCOSE-CAPILLARY: 145 mg/dL — AB (ref 70–99)
GLUCOSE-CAPILLARY: 120 mg/dL — AB (ref 70–99)
GLUCOSE-CAPILLARY: 111 mg/dL — AB (ref 70–99)
GLUCOSE-CAPILLARY: 133 mg/dL — AB (ref 70–99)
GLUCOSE-CAPILLARY: 129 mg/dL — AB (ref 70–99)
GLUCOSE-CAPILLARY: 169 mg/dL — AB (ref 70–99)
GLUCOSE-CAPILLARY: 189 mg/dL — AB (ref 70–99)
Glucose-Capillary: 112 mg/dL — ABNORMAL HIGH (ref 70–99)
Glucose-Capillary: 115 mg/dL — ABNORMAL HIGH (ref 70–99)
Glucose-Capillary: 123 mg/dL — ABNORMAL HIGH (ref 70–99)
Glucose-Capillary: 125 mg/dL — ABNORMAL HIGH (ref 70–99)
Glucose-Capillary: 125 mg/dL — ABNORMAL HIGH (ref 70–99)
Glucose-Capillary: 125 mg/dL — ABNORMAL HIGH (ref 70–99)
Glucose-Capillary: 127 mg/dL — ABNORMAL HIGH (ref 70–99)
Glucose-Capillary: 132 mg/dL — ABNORMAL HIGH (ref 70–99)
Glucose-Capillary: 134 mg/dL — ABNORMAL HIGH (ref 70–99)
Glucose-Capillary: 135 mg/dL — ABNORMAL HIGH (ref 70–99)
Glucose-Capillary: 139 mg/dL — ABNORMAL HIGH (ref 70–99)

## 2014-10-04 LAB — CBC
HEMATOCRIT: 33.4 % — AB (ref 39.0–52.0)
HEMATOCRIT: 32.7 % — AB (ref 39.0–52.0)
Hemoglobin: 11 g/dL — ABNORMAL LOW (ref 13.0–17.0)
Hemoglobin: 10.8 g/dL — ABNORMAL LOW (ref 13.0–17.0)
MCH: 30.6 pg (ref 26.0–34.0)
MCH: 30.9 pg (ref 26.0–34.0)
MCHC: 32.9 g/dL (ref 30.0–36.0)
MCHC: 33 g/dL (ref 30.0–36.0)
MCV: 92.8 fL (ref 78.0–100.0)
MCV: 93.4 fL (ref 78.0–100.0)
PLATELETS: 100 10*3/uL — AB (ref 150–400)
Platelets: 94 10*3/uL — ABNORMAL LOW (ref 150–400)
RBC: 3.6 MIL/uL — ABNORMAL LOW (ref 4.22–5.81)
RBC: 3.5 MIL/uL — ABNORMAL LOW (ref 4.22–5.81)
RDW: 12.7 % (ref 11.5–15.5)
RDW: 12.8 % (ref 11.5–15.5)
WBC: 6.2 10*3/uL (ref 4.0–10.5)
WBC: 7.3 10*3/uL (ref 4.0–10.5)

## 2014-10-04 LAB — CREATININE, SERUM
CREATININE: 0.75 mg/dL (ref 0.50–1.35)
GFR calc non Af Amer: >90 mL/min (ref >90)

## 2014-10-04 LAB — POCT I-STAT, CHEM 8
BUN: 8 mg/dL (ref 6–23)
CALCIUM ION: 1.21 mmol/L (ref 1.13–1.30)
Chloride: 102 mEq/L (ref 96–112)
Creatinine, Ser: 0.8 mg/dL (ref 0.50–1.35)
Glucose, Bld: 116 mg/dL — ABNORMAL HIGH (ref 70–99)
HCT: 33 % — ABNORMAL LOW (ref 39.0–52.0)
HEMOGLOBIN: 11.2 g/dL — AB (ref 13.0–17.0)
Potassium: 3.8 mEq/L (ref 3.7–5.3)
Sodium: 138 mEq/L (ref 137–147)
TCO2: 22 mmol/L (ref 0–100)

## 2014-10-04 LAB — MAGNESIUM
Magnesium: 2.7 mg/dL — ABNORMAL HIGH (ref 1.5–2.5)
Magnesium: 2.6 mg/dL — ABNORMAL HIGH (ref 1.5–2.5)

## 2014-10-04 MED ORDER — INSULIN DETEMIR 100 UNIT/ML ~~LOC~~ SOLN
25.0000 [IU] | Freq: Every day | SUBCUTANEOUS | Status: DC
  Administered 2014-10-04 – 2014-10-05 (×2): 25 [IU] via SUBCUTANEOUS
  Filled 2014-10-04 (×3): qty 0.25

## 2014-10-04 MED ORDER — ALLOPURINOL 150 MG HALF TABLET
150.0000 mg | ORAL_TABLET | Freq: Every day | ORAL | Status: DC
  Administered 2014-10-04 – 2014-10-06 (×3): 150 mg via ORAL
  Filled 2014-10-04 (×4): qty 1

## 2014-10-04 MED ORDER — FOLIC ACID 1 MG PO TABS
500.0000 ug | ORAL_TABLET | Freq: Every day | ORAL | Status: DC
  Administered 2014-10-04 – 2014-10-06 (×3): 0.5 mg via ORAL
  Filled 2014-10-04 (×4): qty 0.5

## 2014-10-04 MED ORDER — POTASSIUM CHLORIDE 10 MEQ/50ML IV SOLN
10.0000 meq | INTRAVENOUS | Status: AC
  Administered 2014-10-04 (×3): 10 meq via INTRAVENOUS
  Filled 2014-10-04: qty 50

## 2014-10-04 MED ORDER — INSULIN ASPART 100 UNIT/ML ~~LOC~~ SOLN
0.0000 [IU] | SUBCUTANEOUS | Status: DC
  Administered 2014-10-04: 2 [IU] via SUBCUTANEOUS
  Administered 2014-10-04: 4 [IU] via SUBCUTANEOUS
  Administered 2014-10-05 (×3): 2 [IU] via SUBCUTANEOUS

## 2014-10-04 MED ORDER — METFORMIN HCL 500 MG PO TABS
500.0000 mg | ORAL_TABLET | Freq: Two times a day (BID) | ORAL | Status: DC
  Administered 2014-10-04 – 2014-10-06 (×6): 500 mg via ORAL
  Filled 2014-10-04 (×9): qty 1

## 2014-10-04 MED ORDER — INSULIN DETEMIR 100 UNIT/ML ~~LOC~~ SOLN: 25.0000 [IU] | Freq: Every day | SUBCUTANEOUS | Status: DC

## 2014-10-04 NOTE — Op Note (Signed)
NAMECAMP, Miguel Vasquez                ACCOUNT NO.:  [id number]  MEDICAL RECORD NO.:  [id number]  LOCATION:  2S08C                        FACILITY:  [facility]  PHYSICIAN:  Revonda Standard. 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  PROCEDURE:  Median sternotomy, extracorporeal circulation, coronary artery bypass grafting x3 (left internal mammary artery to left anterior descending coronary artery, saphenous vein graft to first obtuse marginal, saphenous vein graft to posterior lateral), endoscopic vein harvest, right thigh.  SURGEON:  Revonda Standard. Roxan Hockey, MD  ASSISTANT:  John Giovanni, PA-C  ANESTHESIA:  General.  FINDINGS:  Calcific plaque in the ascending aorta.  Transesophageal echocardiography revealed preserved left ventricular function with mild aortic insufficiency.  Good quality conduits and good quality targets. Weaned from bypass without difficulty.  CLINICAL NOTE:  Miguel Vasquez is a 68 year old gentleman with a longstanding history of coronary disease.  He recently began experiencing anginal pain.  An exercise Myoview was done and showed evidence of ischemia. Cardiac catheterization was performed and showed left main disease as well as a tight ostial right coronary lesion.  The patient was advised to undergo coronary bypass grafting for survival benefit and relief of symptoms.  The indications, risks, benefits, and alternatives were discussed in detail with the patient.  He understood and accepted the risks and agreed to proceed.  OPERATIVE NOTE:  Miguel Vasquez was brought to the preoperative holding area on October 03, 2014, there Anesthesia placed a Swan-Ganz catheter and an arterial blood  pressure monitoring line.  Intravenous antibiotics were administered.  He was taken to the operating room, anesthetized, and intubated.  A Foley catheter was placed.  Transesophageal echocardiography was performed.  It revealed preserved left ventricular function and mild aortic insufficiency.  The chest, abdomen, and legs were prepped and draped in the usual sterile fashion.  Median sternotomy was performed and the left internal mammary artery was harvested using standard technique.  Simultaneously, an incision was made in the medial aspect of the right leg below the knee.  The greater saphenous vein was identified and was harvested from the knee to the groin endoscopically. 2000 units of heparin was administered during the vessel harvest.  Both the mammary artery and saphenous vein were good quality vessels.  After harvesting the conduits, the remainder of full heparin dose was given.  The pericardium was opened.  The ascending aorta was inspected. There was some calcific plaque in the ascending aorta.  The cannulation site and site for cross-clamping were relatively normal and free of any palpable calcific plaque.  After confirming adequate anticoagulation with ACT measurement, the aorta was cannulated via concentric 2-0 Ethibond pledgeted pursestring sutures.  A dual-stage venous cannula was placed via a pursestring suture in the right atrial appendage.  Cardiopulmonary bypass was instituted.  The patient was cooled to 32 degrees Celsius.  Flows were maintained per protocol.  The coronary arteries were inspected and anastomotic sites were chosen. Of  note, there were stents extensively in the right coronary and there was heavy calcification in the distal right coronary artery.  The posterior descending and smaller posterior lateral branches were too small to graft.  The only graftable target in that distribution was the posterior lateral which was the dominant branch.  Both the OM  and the LAD were good quality vessels at the site of the anastomoses.  The conduits were inspected and cut to length.  A foam pad was placed in the pericardium to insulate the heart.  A temperature probe was placed in myocardial septum and a cardioplegia cannula was placed in the ascending aorta.  The aorta was crossclamped.  The left ventricle was emptied via the aortic root vent.  Cardiac arrest then was achieved with combination of cold antegrade blood cardioplegia and topical iced saline.  1 L of cardioplegia was administered.  There was a rapid diastolic arrest. There was myocardial septal cooling to 10 degrees Celsius.  A reversed saphenous vein graft was placed end-to-side to the posterolateral branch of the right coronary. This was a 1.5 mm good quality target.  The vein was of good quality.  The end-to-side anastomosis was performed with a running 7-0 Prolene suture.  At the completion of the anastomosis, a probe passed easily proximally and distally before tying the suture.  Cardioplegia was administered down the graft.  There was good flow and good hemostasis.  Next, a reversed saphenous vein graft was placed end-to-side to the first obtuse marginal branch of the left circumflex, this was a 1.5 mm vessel.  It was intramyocardial distal to the anastomosis.  The probe did pass easily distally, the vein again was of good quality.  It was anastomosed end-to-side with a running 7-0 Prolene suture.  Again a probe passed easily proximally and distally and there was good flow and good hemostasis with cardioplegia administration.  Additional cardioplegia was administered down the aortic root.  The left internal mammary artery then was brought through a window in the pericardium and the distal end was beveled.  It was then anastomosed end- to-side to the distal LAD.  The LAD was a 2 mm good quality target at the site of the anastomosis and the mammary was a 2 mm good quality conduit.   The end-to-side anastomosis was performed with a running 8-0 Prolene suture. A probe passed easily proximally and distally at the completion of the anastomosis.  The bulldog clamp was briefly removed from the left mammary artery.  There was good hemostasis and rapid septal rewarming. The bulldog clamp was replaced.  The mammary pedicle was tacked to the epicardial surface of the heart with 6-0 Prolene sutures.  Additional cardioplegia was administered.  The vein grafts were cut to length.  The cardioplegia cannula was removed from the ascending aorta. Proximal vein graft anastomoses were performed to 4.5 mm punch aortotomies with running 6-0 Prolene sutures.  The posterolateral anastomosis was performed, first, the aorta was a good vessel at the site and the anastomosis was performed without difficulty.  Next, the area where the cardioplegia cannula had been in place was punched with a 4.5 mm punch.  There was some calcific plaque, but it was felt that this could be worked around, however with attempting to do the proximal anastomosis, there was too much resistance to pass the needle and a tendency for the plaque to delaminate.  A small portion of the calcific plaque was debrided.  The aorta was copiously irrigated with iced saline on  multiple occasions to help evacuate any debris.  The proximal anastomosis for the OM vein then was performed with a running 6-0 Prolene suture.  At the completion of anastomosis, the patient was placed in Trendelenburg position.  Lidocaine was administered.  The bulldog clamp was again removed from the left mammary artery.  Extensive de-airing of the left ventricle and aortic root were performed and the aortic crossclamp was removed.  Total crossclamp time was 69 minutes. While rewarming was completed, all proximal and distal anastomoses were inspected for hemostasis.  Epicardial pacing wires were placed on the right ventricle and right atrium.  The patient  did require a single defibrillation with 10 joules and then was in sinus bradycardia.  He was atrially paced.  When the core temperature had risen to 37 degrees Celsius, the patient was weaned from cardiopulmonary bypass without difficulty on the first attempt.  Total bypass time was 112 minutes.  No inotropic support was required.  The initial cardiac index was greater than 2 liters/minute/m. sq.  Postbypass transesophageal echocardiography revealed preserved left ventricular function and no change in the mild aortic insufficiency.  A test dose of protamine was administered and was well tolerated.  The atrial and aortic cannulae were removed.  The remainder of the protamine was administered without incident.  The chest was irrigated with warm saline and hemostasis was achieved.  Left pleural and mediastinal chest tubes were placed with separate subcostal incisions.  Pericardium was reapproximated with interrupted 3-0 silk sutures.  It came together easily without tension or kinking of the underlying grafts.  The sternum was closed with combination of single and double heavy gauge stainless steel wires.  The pectoralis fascia, subcutaneous tissue, and skin were closed in standard fashion.  All sponge, needle, and instrument counts were correct at the end of procedure.  There were no intraoperative complications and the patient was taken from the operating room to the surgical intensive care unit in good condition.     Revonda Standard Roxan Hockey, M.D.     SCH/MEDQ  D:  10/03/2014  T:  10/04/2014  Job:  [id number]

## 2014-10-04 NOTE — Progress Notes (Signed)
1 Day Post-Op Procedure(s) (LRB): CORONARY ARTERY BYPASS GRAFTING (CABG) x  three, using left internal mammary artery and right leg greater saphenous vein harvested endoscopically (N/A) TRANSESOPHAGEAL ECHOCARDIOGRAM (TEE) (N/A) Subjective: No complaints at present, denies pain and nausea  Objective: Vital signs in last 24 hours: Temp:  [94.6 F (34.8 C)-98.4 F (36.9 C)] 98.4 F (36.9 C) (11/26 0900) Pulse Rate:  [80-97] 80 (11/26 0900) Cardiac Rhythm:  [-] Atrial paced (11/26 0800) Resp:  [10-15] 13 (11/25 1827) BP: (90-107)/(54-70) 107/70 mmHg (11/26 0900) SpO2:  [97 %-100 %] 99 % (11/26 0900) Arterial Line BP: (72-140)/(49-89) 133/65 mmHg (11/26 0900) FiO2 (%):  [40 %-50 %] 40 % (11/25 1900) Weight:  [218 lb 4.1 oz (99 kg)-223 lb 12.3 oz (101.5 kg)] 223 lb 12.3 oz (101.5 kg) (11/26 0545)  Hemodynamic parameters for last 24 hours: PAP: (16-27)/(8-19) 21/13 mmHg CO:  [3.4 L/min-7.1 L/min] 4.5 L/min CI:  [1.5 L/min/m2-3.2 L/min/m2] 1.9 L/min/m2  Intake/Output from previous day: 11/25 0701 - 11/26 0700 In: 6503 [I.V.:4147; Blood:676; NG/GT:30; IV Piggyback:1650] Out: 6220 [Urine:4720; Emesis/NG output:150; Blood:900; Chest Tube:450] Intake/Output this shift: Total I/O In: 112.9 [P.O.:60; I.V.:52.9] Out: 130 [Urine:100; Chest Tube:30]  General appearance: alert and no distress Neurologic: intact Heart: regular rate and rhythm and + rub Lungs: diminished breath sounds bibasilar Abdomen: normal findings: soft, non-tender  Lab Results:  Recent Labs  10/03/14 1955 10/03/14 1959 10/04/14 0400  WBC 5.7  --  6.2  HGB 11.1* 11.2* 11.0*  HCT 33.1* 33.0* 33.4*  PLT 89*  --  94*   BMET:  Recent Labs  10/03/14 0325  10/03/14 1959 10/04/14 0400  NA 141  < > 141 137  K 4.7  < > 4.2 4.0  CL 103  < > 106 104  CO2 25  --   --  20  GLUCOSE 120*  < > 126* 127*  BUN 12  < > 8 9  CREATININE 0.88  < > 0.80 0.75  CALCIUM 9.8  --   --  8.4  < > = values in this interval not  displayed.  PT/INR:  Recent Labs  10/03/14 1410  LABPROT 16.9*  INR 1.36   ABG    Component Value Date/Time   PHART 7.331* 10/03/2014 2026   HCO3 22.1 10/03/2014 2026   TCO2 23 10/03/2014 2026   ACIDBASEDEF 4.0* 10/03/2014 2026   O2SAT 99.0 10/03/2014 2026   CBG (last 3)   Recent Labs  10/02/14 1209 10/02/14 2206 10/03/14 1516  GLUCAP 120* 140* 119*    Assessment/Plan: S/P Procedure(s) (LRB): CORONARY ARTERY BYPASS GRAFTING (CABG) x  three, using left internal mammary artery and right leg greater saphenous vein harvested endoscopically (N/A) TRANSESOPHAGEAL ECHOCARDIOGRAM (TEE) (N/A) POD # 1 Doing well  CV- stable, dc swan, a line  ASA, beta blocker, intolerant of statins  RESP- bibasilar atelectasis, IS  RENAL- lytes, creatinine OK  GI- advance diet  ENDO- CBG well controlled  Dc chest tubes  DVT prophylaxis- SCD and mobilization  Hold off on enoxaparin due to thrombocytopenia    LOS: 6 days    HENDRICKSON,STEVEN C 10/04/2014

## 2014-10-04 NOTE — Progress Notes (Signed)
POD # 1 CABG  Not much of an appetite but otherwise feels well  BP 98/64 mmHg  Pulse 67  Temp(Src) 97.6 F (36.4 C) (Oral)  Resp 13  Ht 6\' 2"  (1.88 m)  Wt 223 lb 12.3 oz (101.5 kg)  BMI 28.72 kg/m2  SpO2 91%   Intake/Output Summary (Last 24 hours) at 10/04/14 1830 Last data filed at 10/04/14 1800  Gross per 24 hour  Intake 1720.84 ml  Output   2005 ml  Net -284.16 ml   K= 3.8 - supplement

## 2014-10-05 ENCOUNTER — Inpatient Hospital Stay (HOSPITAL_COMMUNITY): Payer: Medicare HMO

## 2014-10-05 LAB — BASIC METABOLIC PANEL
Anion gap: 11 (ref 5–15)
BUN: 10 mg/dL (ref 6–23)
CALCIUM: 8.7 mg/dL (ref 8.4–10.5)
CO2: 23 mEq/L (ref 19–32)
Chloride: 103 mEq/L (ref 96–112)
Creatinine, Ser: 0.69 mg/dL (ref 0.50–1.35)
Glucose, Bld: 125 mg/dL — ABNORMAL HIGH (ref 70–99)
POTASSIUM: 4.4 meq/L (ref 3.7–5.3)
Sodium: 137 mEq/L (ref 137–147)

## 2014-10-05 LAB — CBC
HCT: 31.2 % — ABNORMAL LOW (ref 39.0–52.0)
Hemoglobin: 10.4 g/dL — ABNORMAL LOW (ref 13.0–17.0)
MCH: 31.3 pg (ref 26.0–34.0)
MCHC: 33.3 g/dL (ref 30.0–36.0)
MCV: 94 fL (ref 78.0–100.0)
PLATELETS: 85 10*3/uL — AB (ref 150–400)
RBC: 3.32 MIL/uL — AB (ref 4.22–5.81)
RDW: 12.9 % (ref 11.5–15.5)
WBC: 6.3 10*3/uL (ref 4.0–10.5)

## 2014-10-05 LAB — GLUCOSE, CAPILLARY
GLUCOSE-CAPILLARY: 112 mg/dL — AB (ref 70–99)
GLUCOSE-CAPILLARY: 132 mg/dL — AB (ref 70–99)
GLUCOSE-CAPILLARY: 135 mg/dL — AB (ref 70–99)
GLUCOSE-CAPILLARY: 156 mg/dL — AB (ref 70–99)
Glucose-Capillary: 110 mg/dL — ABNORMAL HIGH (ref 70–99)
Glucose-Capillary: 131 mg/dL — ABNORMAL HIGH (ref 70–99)
Glucose-Capillary: 123 mg/dL — ABNORMAL HIGH (ref 70–99)

## 2014-10-05 MED ORDER — SODIUM CHLORIDE 0.9 % IV SOLN: 250.0000 mL | INTRAVENOUS | Status: DC | PRN

## 2014-10-05 MED ORDER — GUAIFENESIN-DM 100-10 MG/5ML PO SYRP: 15.0000 mL | ORAL_SOLUTION | ORAL | Status: DC | PRN

## 2014-10-05 MED ORDER — MOVING RIGHT ALONG BOOK
Freq: Once | Status: AC
  Administered 2014-10-05: 20:00:00
  Filled 2014-10-05: qty 1

## 2014-10-05 MED ORDER — SODIUM CHLORIDE 0.9 % IJ SOLN: 3.0000 mL | INTRAMUSCULAR | Status: DC | PRN

## 2014-10-05 MED ORDER — ZOLPIDEM TARTRATE 5 MG PO TABS: 5.0000 mg | ORAL_TABLET | Freq: Every evening | ORAL | Status: DC | PRN

## 2014-10-05 MED ORDER — MAGNESIUM HYDROXIDE 400 MG/5ML PO SUSP: 30.0000 mL | Freq: Every day | ORAL | Status: DC | PRN

## 2014-10-05 MED ORDER — INSULIN ASPART 100 UNIT/ML ~~LOC~~ SOLN
0.0000 [IU] | Freq: Three times a day (TID) | SUBCUTANEOUS | Status: DC
  Administered 2014-10-05: 2 [IU] via SUBCUTANEOUS

## 2014-10-05 MED ORDER — PANTOPRAZOLE SODIUM 40 MG PO TBEC
40.0000 mg | DELAYED_RELEASE_TABLET | Freq: Every day | ORAL | Status: DC
  Administered 2014-10-05 – 2014-10-06 (×2): 40 mg via ORAL
  Filled 2014-10-05 (×2): qty 1

## 2014-10-05 MED ORDER — ATENOLOL 25 MG PO TABS
25.0000 mg | ORAL_TABLET | Freq: Two times a day (BID) | ORAL | Status: DC
  Administered 2014-10-05 – 2014-10-06 (×4): 25 mg via ORAL
  Filled 2014-10-05 (×6): qty 1

## 2014-10-05 MED ORDER — SODIUM CHLORIDE 0.9 % IJ SOLN
3.0000 mL | Freq: Two times a day (BID) | INTRAMUSCULAR | Status: DC
  Administered 2014-10-05 – 2014-10-06 (×3): 3 mL via INTRAVENOUS

## 2014-10-05 MED ORDER — ALPRAZOLAM 0.25 MG PO TABS: 0.2500 mg | ORAL_TABLET | Freq: Four times a day (QID) | ORAL | Status: DC | PRN

## 2014-10-05 MED ORDER — ALUM & MAG HYDROXIDE-SIMETH 200-200-20 MG/5ML PO SUSP: 15.0000 mL | ORAL | Status: DC | PRN

## 2014-10-05 MED FILL — Sodium Chloride IV Soln 0.9%: INTRAVENOUS | Qty: 2000 | Status: AC

## 2014-10-05 MED FILL — Magnesium Sulfate Inj 50%: INTRAMUSCULAR | Qty: 10 | Status: AC

## 2014-10-05 MED FILL — Electrolyte-R (PH 7.4) Solution: INTRAVENOUS | Qty: 4000 | Status: AC

## 2014-10-05 MED FILL — Sodium Bicarbonate IV Soln 8.4%: INTRAVENOUS | Qty: 100 | Status: AC

## 2014-10-05 MED FILL — Mannitol IV Soln 20%: INTRAVENOUS | Qty: 500 | Status: AC

## 2014-10-05 MED FILL — Potassium Chloride Inj 2 mEq/ML: INTRAVENOUS | Qty: 40 | Status: AC

## 2014-10-05 MED FILL — Heparin Sodium (Porcine) Inj 1000 Unit/ML: INTRAMUSCULAR | Qty: 30 | Status: AC

## 2014-10-05 MED FILL — Heparin Sodium (Porcine) Inj 1000 Unit/ML: INTRAMUSCULAR | Qty: 20 | Status: AC

## 2014-10-05 MED FILL — Lidocaine HCl IV Inj 20 MG/ML: INTRAVENOUS | Qty: 5 | Status: AC

## 2014-10-05 NOTE — Progress Notes (Signed)
CARDIAC REHAB PHASE I   Pt asleep upon arrival.  Pt stated that he is fatigued at the moment and needed to rest.  Pt stated he has walked x2 today already and plans to walk again with nurse later tonight.  Pt did not have any questions at this time.  Cardiac rehab will follow-up with pt tomorrow. 1415  Lillia Dallas MS, ACSM RCEP 2:13 PM 10/05/2014

## 2014-10-05 NOTE — Progress Notes (Signed)
2 Days Post-Op Procedure(s) (LRB): CORONARY ARTERY BYPASS GRAFTING (CABG) x  three, using left internal mammary artery and right leg greater saphenous vein harvested endoscopically (N/A) TRANSESOPHAGEAL ECHOCARDIOGRAM (TEE) (N/A) Subjective: Feels better this AM Appetite a little better  Objective: Vital signs in last 24 hours: Temp:  [97.5 F (36.4 C)-98.4 F (36.9 C)] 97.6 F (36.4 C) (11/27 0745) Pulse Rate:  [62-80] 62 (11/27 0800) Cardiac Rhythm:  [-] Normal sinus rhythm (11/27 0800) BP: (98-118)/(61-81) 114/73 mmHg (11/27 0800) SpO2:  [91 %-100 %] 99 % (11/27 0800) Arterial Line BP: (121-133)/(54-65) 127/61 mmHg (11/26 1400) Weight:  [225 lb 5 oz (102.2 kg)] 225 lb 5 oz (102.2 kg) (11/27 0500)  Hemodynamic parameters for last 24 hours: PAP: (21)/(13) 21/13 mmHg  Intake/Output from previous day: 11/26 0701 - 11/27 0700 In: 1806.3 [P.O.:360; I.V.:1196.3; IV Piggyback:250] Out: 830 [Urine:800; Chest Tube:30] Intake/Output this shift: Total I/O In: 27 [P.O.:7; I.V.:20] Out: -   General appearance: alert and no distress Neurologic: intact Heart: regular rate and rhythm Lungs: diminished breath sounds bibasilar Abdomen: normal findings: soft, non-tender  Lab Results:  Recent Labs  10/04/14 1700 10/04/14 1722 10/05/14 0430  WBC 7.3  --  6.3  HGB 10.8* 11.2* 10.4*  HCT 32.7* 33.0* 31.2*  PLT 100*  --  85*   BMET:  Recent Labs  10/04/14 0400  10/04/14 1722 10/05/14 0430  NA 137  --  138 137  K 4.0  --  3.8 4.4  CL 104  --  102 103  CO2 20  --   --  23  GLUCOSE 127*  --  116* 125*  BUN 9  --  8 10  CREATININE 0.75  < > 0.80 0.69  CALCIUM 8.4  --   --  8.7  < > = values in this interval not displayed.  PT/INR:  Recent Labs  10/03/14 1410  LABPROT 16.9*  INR 1.36   ABG    Component Value Date/Time   PHART 7.331* 10/03/2014 2026   HCO3 22.1 10/03/2014 2026   TCO2 22 10/04/2014 1722   ACIDBASEDEF 4.0* 10/03/2014 2026   O2SAT 99.0 10/03/2014 2026    CBG (last 3)   Recent Labs  10/04/14 2345 10/05/14 0411 10/05/14 0742  GLUCAP 132* 123* 156*    Assessment/Plan: S/P Procedure(s) (LRB): CORONARY ARTERY BYPASS GRAFTING (CABG) x  three, using left internal mammary artery and right leg greater saphenous vein harvested endoscopically (N/A) TRANSESOPHAGEAL ECHOCARDIOGRAM (TEE) (N/A) Plan for transfer to step-down: see transfer orders   POD # 2 CABG  Overall looks great Transfer to 2000 CBG well controlled- change to AC/ HS Thrombocytopenia- not on heparin, follow Dc foley Cardiac rehab Restart ACE-I prior to dc   LOS: 7 days    Miguel Vasquez C 10/05/2014

## 2014-10-06 LAB — GLUCOSE, CAPILLARY
GLUCOSE-CAPILLARY: 114 mg/dL — AB (ref 70–99)
GLUCOSE-CAPILLARY: 127 mg/dL — AB (ref 70–99)
GLUCOSE-CAPILLARY: 122 mg/dL — AB (ref 70–99)
Glucose-Capillary: 124 mg/dL — ABNORMAL HIGH (ref 70–99)

## 2014-10-06 NOTE — Discharge Summary (Signed)
[address]       [place] [address]             [phone]              Discharge Summary  Name: Miguel Vasquez DOB: [birth year] 68 y.o. MRN: [id number]   Admission Date: 09/28/2014 Discharge Date: 10/07/2014    Admitting Diagnosis: Severe left main/3 vessel coronary artery disease Unstable angina   Discharge Diagnosis:  Severe left main/3 vessel coronary artery disease Unstable angina Postoperative thrombocytopenia Expected postop blood loss anemia   Past Medical History  Diagnosis Date  . Hypercholesterolemia   . Coronary disease     Status post stenting of the left circumflex coronary in 1996--status post stenting of the right coronary in 2002  . Hypertension     Controlled  . Diabetes mellitus     Type 2  . Hemochromatosis   . Obesity   . History of ulcer disease   . Panic disorder     Hx of panic disorder  . Cervical disc disease     History of Cervical Disc Disease  . Myocardial infarction 2002  . Cataracts, bilateral   . Basal cell carcinoma, face   . S/P tonsillectomy   . Trigger finger   . Hypothyroid      Procedures: CORONARY ARTERY BYPASS GRAFTING x 3 (Left internal mammary artery to left anterior descending, saphenous vein graft to first obtuse marginal, saphenous vein graft to posterolateral) ENDOSCOPIC VEIN HARVEST RIGHT THIGH - 10/03/2014   HPI:  The patient is a 68 y.o. male with a known history of CAD, status post stenting of the left circumflex in 1996 and stenting of the right coronary in 2002. He was recently seen by his primary care physician with complaints of chest discomfort, which was treated initially with Protonix with improvement.  He has continued to have pain which was similar to pain he had prior to stent placement.  He was seen in the cardiology office by Cecilie Kicks, NP and a stress Myoview was ordered, which was abnormal.  Cardiac catheterization was recommended and this was performed on  09/28/2014.  This revealed severe three vessel CAD with 60-70% left main stenosis. The patient was admitted following catheterization for further workup.   Hospital Course:  The patient was admitted to [facility] on 09/28/2014. Cardiac surgery was consulted for consideration of CABG. It was felt that he would benefit from surgical revascularization at this time. All risks, benefits and alternatives of surgery were explained in detail, and the patient agreed to proceed. The patient was taken to the operating room and underwent the above procedure.    The postoperative course has generally been uneventful.  He did have mild thrombocytopenia and expected postop blood loss anemia, but did not require transfusion. He has been volume overloaded and started on Lasix, to which he is responding well.  Blood glucose has been stable on his home diabetes medications (A1C=6.3).  Incisions are healing well.  He remains in normal sinus rhythm. The patient is ambulating in the halls without difficulty and tolerating a regular diet. He has been evaluated on today's date and is stable for discharge home.    Recent vital signs:  Filed Vitals:   10/07/14 0404  BP: 112/75  Pulse: 60  Temp: 98.2 F (36.8 C)  Resp: 18    Recent laboratory studies:  CBC:  Recent Labs  10/05/14 0430 10/07/14 0315  WBC 6.3 4.0  HGB  10.4* 10.5*  HCT 31.2* 31.9*  PLT 85* 111*   BMET:   Recent Labs  10/05/14 0430 10/07/14 0315  NA 137 140  K 4.4 4.1  CL 103 103  CO2 23 23  GLUCOSE 125* 121*  BUN 10 11  CREATININE 0.69 0.63  CALCIUM 8.7 9.0    PT/INR: No results for input(s): LABPROT, INR in the last 72 hours.   Discharge Medications:     Medication List    STOP taking these medications        aspirin 81 MG tablet  Replaced by:  aspirin 325 MG EC tablet     isosorbide mononitrate 30 MG 24 hr tablet  Commonly known as:  IMDUR      TAKE these medications        allopurinol 300 MG tablet  Commonly  known as:  ZYLOPRIM  Take 150 mg by mouth daily.     aspirin 325 MG EC tablet  Take 1 tablet (325 mg total) by mouth daily.     atenolol 25 MG tablet  Commonly known as:  TENORMIN  Take 25 mg by mouth 2 (two) times daily.     cholestyramine light 4 G packet  Commonly known as:  PREVALITE  Take 1 packet (4 g total) by mouth 2 (two) times daily.     ezetimibe 10 MG tablet  Commonly known as:  ZETIA  Take 10 mg by mouth daily.     fenofibrate 145 MG tablet  Commonly known as:  TRICOR  Take 1 tablet (145 mg total) by mouth daily.     FOLIC ACID PO  Take 426 mg by mouth daily.     levothyroxine 100 MCG tablet  Commonly known as:  SYNTHROID, LEVOTHROID  Take 100 mcg by mouth daily.     metFORMIN 500 MG tablet  Commonly known as:  GLUCOPHAGE  Take 500 mg by mouth 2 (two) times daily with a meal.     oxyCODONE 5 MG immediate release tablet  Commonly known as:  Oxy IR/ROXICODONE  Take 1-2 tablets (5-10 mg total) by mouth every 3 (three) hours as needed for severe pain.     pantoprazole 40 MG tablet  Commonly known as:  PROTONIX  Take 1 tablet (40 mg total) by mouth daily.     PARoxetine 20 MG tablet  Commonly known as:  PAXIL  Take 20 mg by mouth daily.     ramipril 5 MG capsule  Commonly known as:  ALTACE  Take 1 capsule (5 mg total) by mouth daily.         Discharge Instructions:  The patient is to refrain from driving, heavy lifting or strenuous activity.  May shower daily and clean incisions with soap and water.  May resume regular diet.   Follow Up: Follow-up Information    Follow up with [facility] R, NP On 10/17/2014.   Specialty:  Cardiology   Why:  Appointment is at 2:00   Contact information:   [address] [place] [address] [phone]       Follow up with Melrose Nakayama, MD.   Specialty:  Cardiothoracic Surgery   Why:  Office will contact you with an appointment   Contact information:   [address] [place] [address] [phone]      The patient has been discharged on:  1.Beta Blocker: Yes [ x ]  No [ ]   If No, reason:    2.Ace Inhibitor/ARB:  Yes [x ]  No [  ]  If No, reason:    3.Statin: Yes [  ]  No [ x ]  If No, reason: Documented intolerance to statins   4.Shela [place]: Yes [ x ]  No [ ]   If No, reason:       Shahad Mazurek H 10/07/2014, 9:14 AM

## 2014-10-06 NOTE — Progress Notes (Addendum)
       [address]       [place] [address]             [phone]          3 Days Post-Op Procedure(s) (LRB): CORONARY ARTERY BYPASS GRAFTING (CABG) x  three, using left internal mammary artery and right leg greater saphenous vein harvested endoscopically (N/A) TRANSESOPHAGEAL ECHOCARDIOGRAM (TEE) (N/A)  Subjective: Feels well, no complaints.  Walked already this am.   Objective: Vital signs in last 24 hours: Patient Vitals for the past 24 hrs:  BP Temp Temp src Pulse Resp SpO2 Weight  10/06/14 0637 104/61 mmHg 98.4 F (36.9 C) Oral 63 18 96 % 224 lb 10.4 oz (101.9 kg)  10/05/14 2102 109/68 mmHg 98.3 F (36.8 C) Oral 68 19 97 % -  10/05/14 1036 (!) 142/83 mmHg 97.8 F (36.6 C) Oral 70 - 100 % -   Current Weight  10/06/14 224 lb 10.4 oz (101.9 kg)  PRE-OPERATIVE WEIGHT: 99kg   Intake/Output from previous day: 11/27 0701 - 11/28 0700 In: 287 [P.O.:247; I.V.:40] Out: 1030 [Urine:1030]  CBGs 112-124   PHYSICAL EXAM:  Heart: RRR Lungs: Clear Wound: Clean and dry Extremities: No LE edema    Lab Results: CBC: Recent Labs  10/04/14 1700 10/04/14 1722 10/05/14 0430  WBC 7.3  --  6.3  HGB 10.8* 11.2* 10.4*  HCT 32.7* 33.0* 31.2*  PLT 100*  --  85*   BMET:  Recent Labs  10/04/14 0400  10/04/14 1722 10/05/14 0430  NA 137  --  138 137  K 4.0  --  3.8 4.4  CL 104  --  102 103  CO2 20  --   --  23  GLUCOSE 127*  --  116* 125*  BUN 9  --  8 10  CREATININE 0.75  < > 0.80 0.69  CALCIUM 8.4  --   --  8.7  < > = values in this interval not displayed.  PT/INR:  Recent Labs  10/03/14 1410  LABPROT 16.9*  INR 1.36      Assessment/Plan: S/P Procedure(s) (LRB): CORONARY ARTERY BYPASS GRAFTING (CABG) x  three, using left internal mammary artery and right leg greater saphenous vein harvested endoscopically (N/A) TRANSESOPHAGEAL ECHOCARDIOGRAM (TEE) (N/A)  CV- BPs stable, SR. Continue Atenolol.   Thrombocytopenia- recheck CBC in  am. DM- sugars stable, back on po meds.  Will d/c insulin. A1C=6.3 CRPI, pulm toilet. Possibly home in am if remains stable.   LOS: 8 days    COLLINS,GINA H 10/06/2014  Patient seen and examined, agree with above Possibly home in AM

## 2014-10-06 NOTE — Plan of Care (Signed)
Problem: Phase I Progression Outcomes Goal: Pain controlled with appropriate interventions Outcome: Completed/Met Date Met:  10/06/14     

## 2014-10-06 NOTE — Progress Notes (Signed)
[id number] Patient ambulated x2 today and plans to ambulate again before bed. CABG discharge education completed with patient including, deep breathing, coughing, and IS use, risk factor modification, restrictions, heart healthy and diabetic diet sheet given, and exercise guidelines given. Pt verbalizes understanding of instructions given. Pt declines referral to Phase 2 cardiac rehab at this time. Sol Passer, MS, ACSM CCEP

## 2014-10-07 LAB — BASIC METABOLIC PANEL
Anion gap: 14 (ref 5–15)
BUN: 11 mg/dL (ref 6–23)
CO2: 23 mEq/L (ref 19–32)
Calcium: 9 mg/dL (ref 8.4–10.5)
Chloride: 103 mEq/L (ref 96–112)
Creatinine, Ser: 0.63 mg/dL (ref 0.50–1.35)
GFR calc Af Amer: >90 mL/min (ref >90)
Glucose, Bld: 121 mg/dL — ABNORMAL HIGH (ref 70–99)
POTASSIUM: 4.1 meq/L (ref 3.7–5.3)
SODIUM: 140 meq/L (ref 137–147)

## 2014-10-07 LAB — CBC
HCT: 31.9 % — ABNORMAL LOW (ref 39.0–52.0)
Hemoglobin: 10.5 g/dL — ABNORMAL LOW (ref 13.0–17.0)
MCH: 31.6 pg (ref 26.0–34.0)
MCHC: 32.9 g/dL (ref 30.0–36.0)
MCV: 96.1 fL (ref 78.0–100.0)
Platelets: 111 10*3/uL — ABNORMAL LOW (ref 150–400)
RBC: 3.32 MIL/uL — ABNORMAL LOW (ref 4.22–5.81)
RDW: 12.8 % (ref 11.5–15.5)
WBC: 4 10*3/uL (ref 4.0–10.5)

## 2014-10-07 LAB — GLUCOSE, CAPILLARY: GLUCOSE-CAPILLARY: 123 mg/dL — AB (ref 70–99)

## 2014-10-07 MED ORDER — RAMIPRIL 5 MG PO CAPS
5.0000 mg | ORAL_CAPSULE | Freq: Every day | ORAL | Status: DC
  Filled 2014-10-07: qty 1

## 2014-10-07 MED ORDER — RAMIPRIL 5 MG PO CAPS: 5.0000 mg | ORAL_CAPSULE | Freq: Every day | ORAL | Status: DC

## 2014-10-07 MED ORDER — ASPIRIN 325 MG PO TBEC: 325.0000 mg | DELAYED_RELEASE_TABLET | Freq: Every day | ORAL | Status: DC

## 2014-10-07 MED ORDER — OXYCODONE HCL 5 MG PO TABS: 5.0000 mg | ORAL_TABLET | ORAL | Status: DC | PRN

## 2014-10-07 NOTE — Progress Notes (Signed)
EPW removed per order. Patient educated concerning bed rest and procedure. Patient verbalized understanding. Patient tolerated well. VSS.   Will continue to monitor closely.  Miguel Vasquez

## 2014-10-07 NOTE — Progress Notes (Signed)
       [address]       [place] [address]             [phone]          4 Days Post-Op Procedure(s) (LRB): CORONARY ARTERY BYPASS GRAFTING (CABG) x  three, using left internal mammary artery and right leg greater saphenous vein harvested endoscopically (N/A) TRANSESOPHAGEAL ECHOCARDIOGRAM (TEE) (N/A)  Subjective: Feels well, wants to go home.   Objective: Vital signs in last 24 hours: Patient Vitals for the past 24 hrs:  BP Temp Temp src Pulse Resp SpO2 Weight  10/07/14 0404 112/75 mmHg 98.2 F (36.8 C) Oral 60 18 97 % 223 lb 12.3 oz (101.5 kg)  10/06/14 2023 (!) 131/100 mmHg 98.2 F (36.8 C) Oral 91 20 100 % -  10/06/14 1602 116/76 mmHg 98.4 F (36.9 C) Oral 64 18 98 % -   Current Weight  10/07/14 223 lb 12.3 oz (101.5 kg)  PRE-OPERATIVE WEIGHT: 99kg   Intake/Output from previous day: 11/28 0701 - 11/29 0700 In: 720 [P.O.:720] Out: 301 [Urine:300; Stool:1]    PHYSICAL EXAM:  Heart: RRR Lungs: Clear Wound: Clean and dry Extremities: No LE edema    Lab Results: CBC: Recent Labs  10/05/14 0430 10/07/14 0315  WBC 6.3 4.0  HGB 10.4* 10.5*  HCT 31.2* 31.9*  PLT 85* 111*   BMET:  Recent Labs  10/05/14 0430 10/07/14 0315  NA 137 140  K 4.4 4.1  CL 103 103  CO2 23 23  GLUCOSE 125* 121*  BUN 10 11  CREATININE 0.69 0.63  CALCIUM 8.7 9.0    PT/INR: No results for input(s): LABPROT, INR in the last 72 hours.    Assessment/Plan: S/P Procedure(s) (LRB): CORONARY ARTERY BYPASS GRAFTING (CABG) x  three, using left internal mammary artery and right leg greater saphenous vein harvested endoscopically (N/A) TRANSESOPHAGEAL ECHOCARDIOGRAM (TEE) (N/A) BPs trending up, will resume low dose ACE-I and continue Atenolol. DM- sugars stable on home meds. Home later today- instructions reviewed with patient.    LOS: 9 days    Miguel Vasquez H 10/07/2014

## 2014-10-08 ENCOUNTER — Ambulatory Visit: Payer: BC Managed Care – PPO | Admitting: Cardiology

## 2014-10-08 ENCOUNTER — Encounter (HOSPITAL_COMMUNITY): Payer: Self-pay | Admitting: Thoracic Surgery (Cardiothoracic Vascular Surgery)

## 2014-10-08 NOTE — Progress Notes (Signed)
dc'ed pacing wires pt. tolerated well 

## 2014-10-08 NOTE — Progress Notes (Signed)
Discharged to home with family office visits in place teaching done  

## 2014-10-12 ENCOUNTER — Ambulatory Visit (INDEPENDENT_AMBULATORY_CARE_PROVIDER_SITE_OTHER): Payer: Self-pay | Admitting: *Deleted

## 2014-10-12 DIAGNOSIS — I25700 Atherosclerosis of coronary artery bypass graft(s), unspecified, with unstable angina pectoris: Secondary | ICD-10-CM

## 2014-10-12 DIAGNOSIS — I2510 Atherosclerotic heart disease of native coronary artery without angina pectoris: Secondary | ICD-10-CM

## 2014-10-12 DIAGNOSIS — Z951 Presence of aortocoronary bypass graft: Secondary | ICD-10-CM

## 2014-10-12 NOTE — Progress Notes (Signed)
Miguel Vasquez's appointment today was for suture removaL s/p CABG. The sutures were removed prior to discharge. He came because he was concerned with the appearance of one of the chest tube sites.  It is slightly open but without drainage and healing.  All other sites, the sternal incision, the other chest tube site and the right leg EVH site, are very well healed.  He is tolerating his diet, bowels are  Good and he is walking.  He will return as scheduled with a cxr.

## 2014-10-17 ENCOUNTER — Ambulatory Visit (INDEPENDENT_AMBULATORY_CARE_PROVIDER_SITE_OTHER): Payer: Medicare HMO | Admitting: Cardiology

## 2014-10-17 ENCOUNTER — Encounter: Payer: Self-pay | Admitting: Cardiology

## 2014-10-17 VITALS — BP 118/72 | HR 58 | Ht 73.0 in | Wt 217.1 lb

## 2014-10-17 DIAGNOSIS — E7800 Pure hypercholesterolemia, unspecified: Secondary | ICD-10-CM

## 2014-10-17 DIAGNOSIS — Z951 Presence of aortocoronary bypass graft: Secondary | ICD-10-CM

## 2014-10-17 NOTE — Progress Notes (Signed)
10/17/2014   PCP: Coy Saunas, MD   Chief Complaint  Patient presents with  . Follow-up    post cath and CABG. no chest pain,SOB or edema since surgery    Primary Cardiologist:Dr. P. Martinique   HPI:  68 year old male with a history of coronary disease and is status post stenting of the left circumflex coronary in 1996. He had stenting of the right coronary in 2002. His last Myoview study was in November of 2012. He is currently off all statins because of muscle aches. He did have elevated CPK levels noted with Lipitor. He remains on Zetia. Was recently started on cholestyramine and fenofibrate.[name] LDL is now 145. Triglycerides are 357, tot. Chol is 257. He had been watching his diet much closer and had lost 24 pounds but has gained back some.  I saw 09/20/14 for chest discomfort- he had been seen by PCP. Thought it was PPI symptoms and seems better on Protonix. Pt concerned about his heart- on last visit Dr. P. Martinique had recommended stress test this year. The pt feels the discomfort similar to his previous angina.  Stress test was ordered and was positive for inf. Ischemia.  EF 56%.  His angina was worse by this time and pt underwent cardiac cath, finding:  Severe coronary calcification and multivessel CAD with 60-70% ostial left main stenosis; segmental 50% proximal and mid LAD stenoses; 40% narrowing within the stented segment in the left circumflex coronary artery; and significant ostial RCA calcification with 95% ostial stenosis followed by a 90% proximal stenosis. An area of calcification with probable thrombus, and 40% mid RCA stenosis and a previously placed stent with a patent distal RCA stent.  Normal LV contractility with an ejection fraction of 55% and evidence for mild mid to basal inferior hypocontractility .  TCTS consulted and pt underwent CABG by Dr. Roxan Hockey with CORONARY ARTERY BYPASS GRAFTING (CABG) x 3  LIMA to LAD SVG to  OM SVG to PL  Post op without complications.  He is here today for follow up.    He has no complaints.  Feels well.   Allergies  Allergen Reactions  . Statins Other (See Comments)    "Affects liver enzymes"    Current Outpatient Prescriptions  Medication Sig Dispense Refill  . allopurinol (ZYLOPRIM) 300 MG tablet Take 150 mg by mouth daily.     [name] aspirin 81 MG tablet Take 81 mg by mouth daily.    [name] atenolol (TENORMIN) 25 MG tablet Take 25 mg by mouth 2 (two) times daily.     . cholestyramine light (PREVALITE) 4 G packet Take 1 packet (4 g total) by mouth 2 (two) times daily.    [name] ezetimibe (ZETIA) 10 MG tablet Take 10 mg by mouth daily.      . fenofibrate (TRICOR) 145 MG tablet Take 1 tablet (145 mg total) by mouth daily.    [name] FOLIC ACID PO Take 631 mg by mouth daily.     [name] levothyroxine (SYNTHROID, LEVOTHROID) 100 MCG tablet Take 100 mcg by mouth daily.      . metFORMIN (GLUCOPHAGE) 500 MG tablet Take 500 mg by mouth 2 (two) times daily with a meal.     . oxyCODONE (OXY IR/ROXICODONE) 5 MG immediate release tablet Take 1-2 tablets (5-10 mg total) by mouth every 3 (three) hours as needed for severe pain. 30 tablet 0  . pantoprazole (PROTONIX) 40 MG tablet Take 1 tablet (40  mg total) by mouth daily. 30 tablet 11  . PARoxetine (PAXIL) 20 MG tablet Take 20 mg by mouth daily.      . ramipril (ALTACE) 10 MG capsule Take 10 mg by mouth daily.     No current facility-administered medications for this visit.    Past Medical History  Diagnosis Date  . Hypercholesterolemia   . Coronary disease     Status post stenting of the left circumflex coronary in 1996--status post stenting of the right coronary in 2002, CABG 09/2014  . Hypertension     Controlled  . Diabetes mellitus     Type 2  . Hemochromatosis   . Obesity   . History of ulcer disease   . Panic disorder     Hx of panic disorder  . Cervical disc disease     History of Cervical Disc Disease  . Myocardial  infarction 2002  . Cataracts, bilateral   . Basal cell carcinoma, face   . S/P tonsillectomy   . Trigger finger   . Hypothyroid     Past Surgical History  Procedure Laterality Date  . Coronary stent placement      x3  . Coronary artery bypass graft N/A 10/03/2014    Procedure: CORONARY ARTERY BYPASS GRAFTING (CABG) x  three, using left internal mammary artery and right leg greater saphenous vein harvested endoscopically;  Surgeon: Melrose Nakayama, MD;  Location: [facility];  Service: Open Heart Surgery;  Laterality: N/A;  . Tee without cardioversion N/A 10/03/2014    Procedure: TRANSESOPHAGEAL ECHOCARDIOGRAM (TEE);  Surgeon: Melrose Nakayama, MD;  Location: [facility];  Service: Open Heart Surgery;  Laterality: N/A;  . Cardiac catheterization      HCW:CBJSEGB:TD colds or fevers,  weight down post op Skin:no rashes or ulcers HEENT:no blurred vision, no congestion CV:see HPI PUL:see HPI GI:no diarrhea constipation or melena, no indigestion GU:no hematuria, no dysuria MS:no joint pain, no claudication Neuro:no syncope, no lightheadedness Endo:+ diabetes well contolled, no thyroid disease  Wt Readings from Last 3 Encounters:  10/17/14 217 lb 1.6 oz (98.476 kg)  10/07/14 223 lb 12.3 oz (101.5 kg)  09/25/14 223 lb (101.152 kg)    PHYSICAL EXAM BP 118/72 mmHg  Pulse 58  Ht 6\' 1"  (1.854 m)  Wt 217 lb 1.6 oz (98.476 kg)  BMI 28.65 kg/m2 General:Pleasant affect, NAD Skin:Warm and dry, brisk capillary refill HEENT:normocephalic, sclera clear, mucus membranes moist Neck:supple, no JVD, no bruits  Heart:S1S2 RRR without murmur, gallup, rub or click Lungs:clear without rales, rhonchi, or wheezes VVO:HYWV, non tender, + BS, do not palpate liver spleen or masses Ext:no lower ext edema, 2+ pedal pulses, 2+ radial pulses Neuro:alert and oriented, MAE, follows commands, + facial symmetry EKG: S Brady at 58 no acute changes, + artifact. Reviewed with Dr. Jerilynn Mages. Croitoru   ASSESSMENT AND  PLAN S/P CABG x 3 Hx of previous stents, now with angina, + nuc study and significant disease on cath, undergoing CABG.  He has done well and walking half a mile a day.  He will follow up with Dr. P. Martinique in 6 weeks, he will call if any problems prior to that time.  Hypercholesterolemia stable

## 2014-10-17 NOTE — Patient Instructions (Signed)
Cecilie Kicks, NP, recommends that you schedule a follow-up appointment in 6 weeks with Dr Martinique.

## 2014-10-17 NOTE — Assessment & Plan Note (Signed)
stable °

## 2014-10-17 NOTE — Assessment & Plan Note (Signed)
Hx of previous stents, now with angina, + nuc study and significant disease on cath, undergoing CABG.  He has done well and walking half a mile a day.  He will follow up with Dr. P. Martinique in 6 weeks, he will call if any problems prior to that time.

## 2014-10-18 ENCOUNTER — Encounter (HOSPITAL_COMMUNITY): Payer: Self-pay | Admitting: Cardiovascular Disease

## 2014-11-07 ENCOUNTER — Other Ambulatory Visit: Payer: Self-pay | Admitting: *Deleted

## 2014-11-07 DIAGNOSIS — I25119 Atherosclerotic heart disease of native coronary artery with unspecified angina pectoris: Secondary | ICD-10-CM

## 2014-11-09 HISTORY — PX: CATARACT EXTRACTION W/ INTRAOCULAR LENS  IMPLANT, BILATERAL: SHX1307

## 2014-11-13 ENCOUNTER — Ambulatory Visit (INDEPENDENT_AMBULATORY_CARE_PROVIDER_SITE_OTHER): Payer: Self-pay | Admitting: Thoracic Surgery (Cardiothoracic Vascular Surgery)

## 2014-11-13 ENCOUNTER — Ambulatory Visit
Admission: RE | Admit: 2014-11-13 | Discharge: 2014-11-13 | Disposition: A | Discharge status: Home / Self Care | Payer: Medicare HMO | Source: Ambulatory Visit | Attending: Thoracic Surgery (Cardiothoracic Vascular Surgery) | Admitting: Thoracic Surgery (Cardiothoracic Vascular Surgery)

## 2014-11-13 ENCOUNTER — Encounter: Payer: Self-pay | Admitting: Thoracic Surgery (Cardiothoracic Vascular Surgery)

## 2014-11-13 VITALS — BP 130/80 | HR 60 | Resp 20 | Ht 73.0 in | Wt 219.0 lb

## 2014-11-13 DIAGNOSIS — I25119 Atherosclerotic heart disease of native coronary artery with unspecified angina pectoris: Secondary | ICD-10-CM

## 2014-11-13 DIAGNOSIS — I25700 Atherosclerosis of coronary artery bypass graft(s), unspecified, with unstable angina pectoris: Secondary | ICD-10-CM

## 2014-11-13 DIAGNOSIS — Z951 Presence of aortocoronary bypass graft: Secondary | ICD-10-CM

## 2014-11-13 MED ORDER — PREDNISONE (PAK) 10 MG PO TABS: ORAL_TABLET | ORAL | Status: DC

## 2014-11-13 NOTE — Progress Notes (Signed)
HPI:  Mr. Kowalski returns today for scheduled postoperative follow-up visit.  He is a 69 year old gentleman who underwent coronary bypass grafting 3 on 10/03/2014 for three-vessel disease with exertional angina. His postoperative course was uncomplicated. He had preserved left ventricular function.  Since discharge he has done well. He has minimal discomfort. He is not taking any narcotics. His exercise tolerance is good. He is anxious to increase his physical activities.  Past Medical History  Diagnosis Date  . Hypercholesterolemia   . Coronary disease     Status post stenting of the left circumflex coronary in 1996--status post stenting of the right coronary in 2002, CABG 09/2014  . Hypertension     Controlled  . Diabetes mellitus     Type 2  . Hemochromatosis   . Obesity   . History of ulcer disease   . Panic disorder     Hx of panic disorder  . Cervical disc disease     History of Cervical Disc Disease  . Myocardial infarction 2002  . Cataracts, bilateral   . Basal cell carcinoma, face   . S/P tonsillectomy   . Trigger finger   . Hypothyroid       Current Outpatient Prescriptions  Medication Sig Dispense Refill  . acetaminophen (TYLENOL) 500 MG tablet Take 500 mg by mouth every 8 (eight) hours as needed for moderate pain.    [name] allopurinol (ZYLOPRIM) 300 MG tablet Take 150 mg by mouth daily.     [name] aspirin 81 MG tablet Take 81 mg by mouth daily.    [name] atenolol (TENORMIN) 25 MG tablet Take 25 mg by mouth 2 (two) times daily.     . cholestyramine (QUESTRAN) 4 G packet Take by mouth 2 (two) times daily.     . cholestyramine light (PREVALITE) 4 G packet Take 1 packet (4 g total) by mouth 2 (two) times daily.    [name] ezetimibe (ZETIA) 10 MG tablet Take 10 mg by mouth daily.      . fenofibrate (TRICOR) 145 MG tablet Take 1 tablet (145 mg total) by mouth daily.    [name] FOLIC ACID PO Take 269 mg by mouth daily.     [name] levothyroxine (SYNTHROID, LEVOTHROID) 100 MCG tablet Take 100 mcg by  mouth daily.      . metFORMIN (GLUCOPHAGE) 500 MG tablet Take 500 mg by mouth 2 (two) times daily with a meal.     . nitroGLYCERIN (NITROSTAT) 0.4 MG SL tablet Place 0.4 mg under the tongue every 5 (five) minutes as needed. DISSOLVE ONE TABLET UNDER THE TONGUE EVERY 5 MINUTES AS NEEDED FOR CHEST PAIN.  DO NOT EXCEED A TOTAL OF 3 DOSES IN 15 MINUTES    . pantoprazole (PROTONIX) 40 MG tablet Take 1 tablet (40 mg total) by mouth daily. 30 tablet 11  . PARoxetine (PAXIL) 20 MG tablet Take 20 mg by mouth daily.      . ramipril (ALTACE) 10 MG capsule Take 10 mg by mouth daily.    . predniSONE (STERAPRED UNI-PAK) 10 MG tablet Take by mouth as directed. Take 5 tablets by mouth on day 1, 4 tablets by mouth on day 2, 3 tablets by mouth on day 3, 2 tablets by mouth 1 day for, 1 tablet by mouth on day 5 15 tablet 0   No current facility-administered medications for this visit.    Physical Exam BP 130/80 mmHg  Pulse 60  Resp 20  Ht 6\' 1"  (1.854 m)  Wt 219 lb (99.338  kg)  BMI 28.90 kg/m2  SpO2 98% Well-appearing 69 year old man in no acute distress Alert and oriented 3 with no focal neurologic deficits Sternum stable, incision healing well Leg incision and intact, no peripheral edema Cardiac regular rate and rhythm normal S1 and S2 Lungs clear with equal breath sounds bilaterally   Diagnostic Tests: CHEST 2 VIEW  COMPARISON: Portable chest x-ray of October 05, 2014  FINDINGS: There is a small left pleural effusion which has appeared since the previous study. The right lung is clear. There is no mediastinal shift. The heart and pulmonary vascularity are normal. There are 6 intact sternal wires. The trachea is midline.  IMPRESSION: There has been interval development of a small left pleural effusion. There is no evidence of CHF nor pneumonia.   Electronically Signed  By: David Martinique  On: 11/13/2014 12:09  Impression: 69 year old man who is now about 6 weeks out from  coronary bypass grafting 3. He is doing very well at this point in time. He has minimal discomfort. His exercise tolerance is good.  He has not had any recurrent angina.  He may drive. He no longer is under any restrictions regards to his activities, but he was advised to build into new activities gradually over time.  He prefers 81 mg aspirin tablet to 325 mg.  His chest x-ray today did show a small left pleural effusion. This is new. It is not large enough to warrant thoracentesis. However, I do think we should treat it. I'm going to give him a prescription for a prednisone taper.  Plan: Prednisone taper  I will see him back in 3 weeks with a repeat chest x-ray to check on the progress of the pleural effusion thank you

## 2014-11-23 ENCOUNTER — Other Ambulatory Visit: Payer: Self-pay | Admitting: Thoracic Surgery (Cardiothoracic Vascular Surgery)

## 2014-11-23 DIAGNOSIS — Z951 Presence of aortocoronary bypass graft: Secondary | ICD-10-CM

## 2014-11-27 ENCOUNTER — Encounter: Payer: Self-pay | Admitting: Thoracic Surgery (Cardiothoracic Vascular Surgery)

## 2014-11-27 ENCOUNTER — Ambulatory Visit (INDEPENDENT_AMBULATORY_CARE_PROVIDER_SITE_OTHER): Payer: Self-pay | Admitting: Thoracic Surgery (Cardiothoracic Vascular Surgery)

## 2014-11-27 ENCOUNTER — Ambulatory Visit
Admission: RE | Admit: 2014-11-27 | Discharge: 2014-11-27 | Disposition: A | Discharge status: Home / Self Care | Payer: Medicare PPO | Source: Ambulatory Visit | Attending: Thoracic Surgery (Cardiothoracic Vascular Surgery) | Admitting: Thoracic Surgery (Cardiothoracic Vascular Surgery)

## 2014-11-27 VITALS — BP 134/83 | HR 60 | Resp 20 | Ht 73.0 in | Wt 218.0 lb

## 2014-11-27 DIAGNOSIS — J948 Other specified pleural conditions: Secondary | ICD-10-CM

## 2014-11-27 DIAGNOSIS — Z951 Presence of aortocoronary bypass graft: Secondary | ICD-10-CM

## 2014-11-27 DIAGNOSIS — J90 Pleural effusion, not elsewhere classified: Secondary | ICD-10-CM

## 2014-11-27 NOTE — Progress Notes (Signed)
HPI:  Miguel Vasquez returns today for scheduled postoperative follow-up visit.  He is a 69 year old gentleman who underwent coronary bypass grafting 3 on 10/03/2014 for three-vessel disease with exertional angina. His postoperative course was uncomplicated.   I saw him on January 5. That time he was doing well and was anxious to increase his activities. He did have a small left pleural effusion for which we gave him a steroid taper. He now returns to reevaluate that effusion.  He says that since he started being more active he is feeling a little more discomfort. He is not feeling any clicking or popping or motion of the sternum. He has not had any anginal pain. He denies any shortness of breath. He did not have any significant side effects from the prednisone. He says that he has made some easy swings with a golf club hitting short pitch shots. He says his goal is to be able to play golf in a month.  Past Medical History  Diagnosis Date  . Hypercholesterolemia   . Coronary disease     Status post stenting of the left circumflex coronary in 1996--status post stenting of the right coronary in 2002, CABG 09/2014  . Hypertension     Controlled  . Diabetes mellitus     Type 2  . Hemochromatosis   . Obesity   . History of ulcer disease   . Panic disorder     Hx of panic disorder  . Cervical disc disease     History of Cervical Disc Disease  . Myocardial infarction 2002  . Cataracts, bilateral   . Basal cell carcinoma, face   . S/P tonsillectomy   . Trigger finger   . Hypothyroid       Current Outpatient Prescriptions  Medication Sig Dispense Refill  . acetaminophen (TYLENOL) 500 MG tablet Take 500 mg by mouth every 8 (eight) hours as needed for moderate pain.    [name] allopurinol (ZYLOPRIM) 300 MG tablet Take 150 mg by mouth daily.     [name] aspirin 81 MG tablet Take 81 mg by mouth daily.    [name] atenolol (TENORMIN) 25 MG tablet Take 25 mg by mouth 2 (two) times daily.     . cholestyramine  (QUESTRAN) 4 G packet Take by mouth 2 (two) times daily.     [name] ezetimibe (ZETIA) 10 MG tablet Take 10 mg by mouth daily.      . fenofibrate (TRICOR) 145 MG tablet Take 1 tablet (145 mg total) by mouth daily.    [name] FOLIC ACID PO Take 564 mg by mouth daily.     [name] levothyroxine (SYNTHROID, LEVOTHROID) 100 MCG tablet Take 100 mcg by mouth daily.      . metFORMIN (GLUCOPHAGE) 500 MG tablet Take 500 mg by mouth 2 (two) times daily with a meal.     . nitroGLYCERIN (NITROSTAT) 0.4 MG SL tablet Place 0.4 mg under the tongue every 5 (five) minutes as needed. DISSOLVE ONE TABLET UNDER THE TONGUE EVERY 5 MINUTES AS NEEDED FOR CHEST PAIN.  DO NOT EXCEED A TOTAL OF 3 DOSES IN 15 MINUTES    . pantoprazole (PROTONIX) 40 MG tablet Take 1 tablet (40 mg total) by mouth daily. 30 tablet 11  . PARoxetine (PAXIL) 20 MG tablet Take 20 mg by mouth daily.      . ramipril (ALTACE) 10 MG capsule Take 10 mg by mouth daily.     No current facility-administered medications for this visit.    Physical Exam BP  134/83 mmHg  Pulse 60  Resp 20  Ht 6\' 1"  (1.854 m)  Wt 218 lb (98.884 kg)  BMI 28.77 kg/m2  SpO2 98% Well-appearing 69 year old male in no acute distress Alert and oriented 3 with no focal deficits Sternal incision clean dry and intact, sternum stable Cardiac regular rate and rhythm normal S1 and S2 Lungs clear with essentially equal breath sounds bilaterally No peripheral edema  Diagnostic Tests: CHEST 2 VIEW  COMPARISON: None.  FINDINGS: Mediastinum and hilar structures normal. Left lower lobe infiltrate consistent with pneumonia and small left pleural effusion common these findings and partial tear from prior exam. Scratch Prior CABG. No acute bony abnormality.  IMPRESSION: 1. Partial clearing of left lower lobe infiltrate and left pleural effusion. 2. Prior CABG.   Electronically Signed  By: Miguel Vasquez  On: 11/27/2014 14:51  Impression: 69 year old gentleman who is now  about 2 months out from coronary bypass grafting x 3. He continues to do well. His exercise tolerance is good. I encouraged him to do cardiac rehabilitation, but he does not want to do that. He would rather do it on his own. I strongly tried to get him to at least try out the program and go for a few visits, but he remained uninterested.  I reviewed his chest x-ray. The pleural effusion has improved significantly. It has not completely resolved, but the small residual effusion is of no clinical significance.  Plan:  He will follow-up with Dr. Peter Vasquez of Cardiology.  I will be happy to see him back any time if I can be of any further assistance with his care.  Miguel Vasquez M.D.

## 2014-12-05 ENCOUNTER — Encounter: Payer: Self-pay | Admitting: Cardiology

## 2014-12-05 ENCOUNTER — Ambulatory Visit (INDEPENDENT_AMBULATORY_CARE_PROVIDER_SITE_OTHER): Payer: Medicare PPO | Admitting: Cardiology

## 2014-12-05 VITALS — BP 110/80 | HR 61 | Ht 73.0 in | Wt 218.4 lb

## 2014-12-05 DIAGNOSIS — E119 Type 2 diabetes mellitus without complications: Secondary | ICD-10-CM

## 2014-12-05 DIAGNOSIS — E7800 Pure hypercholesterolemia, unspecified: Secondary | ICD-10-CM

## 2014-12-05 DIAGNOSIS — Z951 Presence of aortocoronary bypass graft: Secondary | ICD-10-CM

## 2014-12-05 DIAGNOSIS — I10 Essential (primary) hypertension: Secondary | ICD-10-CM

## 2014-12-05 DIAGNOSIS — I25810 Atherosclerosis of coronary artery bypass graft(s) without angina pectoris: Secondary | ICD-10-CM

## 2014-12-05 NOTE — Patient Instructions (Signed)
Continue your current therapy  I will see you in 6 months.   

## 2014-12-05 NOTE — Progress Notes (Signed)
12/05/2014   PCP: Coy Saunas, MD   Chief Complaint  Patient presents with  . Follow-up    pt denies chest pain, sob, and swelling.    HPI:  Miguel Vasquez is seen for follow up CAD. He has a history of coronary disease and is status post stenting of the left circumflex coronary in 1996. He had stenting of the right coronary in 2002.  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  He remains on Zetia. Also on cholestyramine and fenofibrate.[name] He was seen in November for chest discomfort.  Stress test was ordered and was positive for inf. Ischemia.  EF 56%.  His angina was worse by this time and pt underwent cardiac cath, finding:  Severe coronary calcification and multivessel CAD with 60-70% ostial left main stenosis; segmental 50% proximal and mid LAD stenoses; 40% narrowing within the stented segment in the left circumflex coronary artery; and significant ostial RCA calcification with 95% ostial stenosis followed by a 90% proximal stenosis. An area of calcification with probable thrombus, and 40% mid RCA stenosis and a previously placed stent with a patent distal RCA stent.  Normal LV contractility with an ejection fraction of 55% and evidence for mild mid to basal inferior hypocontractility .  TCTS consulted and pt underwent CABG by Dr. Roxan Hockey with CORONARY ARTERY BYPASS GRAFTING (CABG) x 3  LIMA to LAD SVG to OM SVG to PL He has done well post op without complications. Denies any chest pain or SOB. No palpitations. He is walking daily. Ready to start playing golf again.   Allergies  Allergen Reactions  . Statins Other (See Comments)    "Affects liver enzymes"    Current Outpatient Prescriptions  Medication Sig Dispense Refill  . acetaminophen (TYLENOL) 500 MG tablet Take 500 mg by mouth every 8 (eight) hours as needed for moderate pain.    [name] allopurinol (ZYLOPRIM) 300 MG tablet Take 150 mg by mouth daily.     [name] aspirin 81 MG  tablet Take 81 mg by mouth daily.    [name] atenolol (TENORMIN) 25 MG tablet Take 25 mg by mouth 2 (two) times daily.     . cholestyramine (QUESTRAN) 4 G packet Take by mouth 2 (two) times daily.     [name] ezetimibe (ZETIA) 10 MG tablet Take 10 mg by mouth daily.      . fenofibrate (TRICOR) 145 MG tablet Take 1 tablet (145 mg total) by mouth daily.    [name] FOLIC ACID PO Take 448 mg by mouth daily.     [name] levothyroxine (SYNTHROID, LEVOTHROID) 100 MCG tablet Take 100 mcg by mouth daily.      . metFORMIN (GLUCOPHAGE) 500 MG tablet Take 500 mg by mouth 2 (two) times daily with a meal.     . nitroGLYCERIN (NITROSTAT) 0.4 MG SL tablet Place 0.4 mg under the tongue every 5 (five) minutes as needed. DISSOLVE ONE TABLET UNDER THE TONGUE EVERY 5 MINUTES AS NEEDED FOR CHEST PAIN.  DO NOT EXCEED A TOTAL OF 3 DOSES IN 15 MINUTES    . pantoprazole (PROTONIX) 40 MG tablet Take 1 tablet (40 mg total) by mouth daily. 30 tablet 11  . PARoxetine (PAXIL) 20 MG tablet Take 20 mg by mouth daily.      . ramipril (ALTACE) 10 MG capsule Take 10 mg by mouth daily.     No current facility-administered medications for this visit.    Past Medical History  Diagnosis  Date  . Hypercholesterolemia   . Coronary disease     Status post stenting of the left circumflex coronary in 1996--status post stenting of the right coronary in 2002, CABG 09/2014  . Hypertension     Controlled  . Diabetes mellitus     Type 2  . Hemochromatosis   . Obesity   . History of ulcer disease   . Panic disorder     Hx of panic disorder  . Cervical disc disease     History of Cervical Disc Disease  . Myocardial infarction 2002  . Cataracts, bilateral   . Basal cell carcinoma, face   . S/P tonsillectomy   . Trigger finger   . Hypothyroid     Past Surgical History  Procedure Laterality Date  . Coronary stent placement      x3  . Coronary artery bypass graft N/A 10/03/2014    Procedure: CORONARY ARTERY BYPASS GRAFTING (CABG) x  three, using left  internal mammary artery and right leg greater saphenous vein harvested endoscopically;  Surgeon: Melrose Nakayama, MD;  Location: [place];  Service: Open Heart Surgery;  Laterality: N/A;  . Tee without cardioversion N/A 10/03/2014    Procedure: TRANSESOPHAGEAL ECHOCARDIOGRAM (TEE);  Surgeon: Melrose Nakayama, MD;  Location: [place];  Service: Open Heart Surgery;  Laterality: N/A;  . Cardiac catheterization    . Left heart catheterization with coronary angiogram N/A 09/28/2014    Procedure: LEFT HEART CATHETERIZATION WITH CORONARY ANGIOGRAM;  Surgeon: Troy Sine, MD;  Location: [facility] CATH LAB;  Service: Cardiovascular;  Laterality: N/A;    QPR:FFMBWGY:KZ colds or fevers,  weight down post op Skin:no rashes or ulcers HEENT:no blurred vision, no congestion CV:see HPI PUL:see HPI GI:no diarrhea constipation or melena, no indigestion GU:no hematuria, no dysuria MS:no joint pain, no claudication Neuro:no syncope, no lightheadedness Endo:+ diabetes well contolled, no thyroid disease  Wt Readings from Last 3 Encounters:  12/05/14 218 lb 6.4 oz (99.066 kg)  11/27/14 218 lb (98.884 kg)  11/13/14 219 lb (99.338 kg)    PHYSICAL EXAM BP 110/80 mmHg  Pulse 61  Ht 6\' 1"  (1.854 m)  Wt 218 lb 6.4 oz (99.066 kg)  BMI 28.82 kg/m2 General:Pleasant affect, NAD Skin:Warm and dry, brisk capillary refill HEENT:normocephalic, sclera clear, mucus membranes moist Neck:supple, no JVD, no bruits  Heart:S1S2 RRR without murmur, gallup, rub or click Lungs:clear without rales, rhonchi, or wheezes LDJ:TTSV, non tender, + BS, do not palpate liver spleen or masses Ext:no lower ext edema, 2+ pedal pulses, 2+ radial pulses Neuro:alert and oriented, MAE, follows commands, + facial symmetry   ASSESSMENT AND PLAN 1. CAD with remote PCI. S/p CABG in November 2015. He has made a nice recovery. Will continue medical therapy. Encourage exercise. No restrictions now.   2. Hyperlipidemia. Intolerant to statins.  Medical therapy as noted above. He is scheduled for fasting lab work with Dr. Rosana Hoes in a couple of weeks.   3. HTN controlled.   4. DM type 2- per primary care.   I will follow up in 6 months.

## 2015-04-04 IMAGING — CR DG CHEST 1V PORT
1 series · 1 of 1 positions shown · non-contrast
Comparison: PA and lateral chest 09/27/2014.

CLINICAL DATA: Status post CABG.

EXAM:
PORTABLE CHEST - 1 VIEW

[portable]
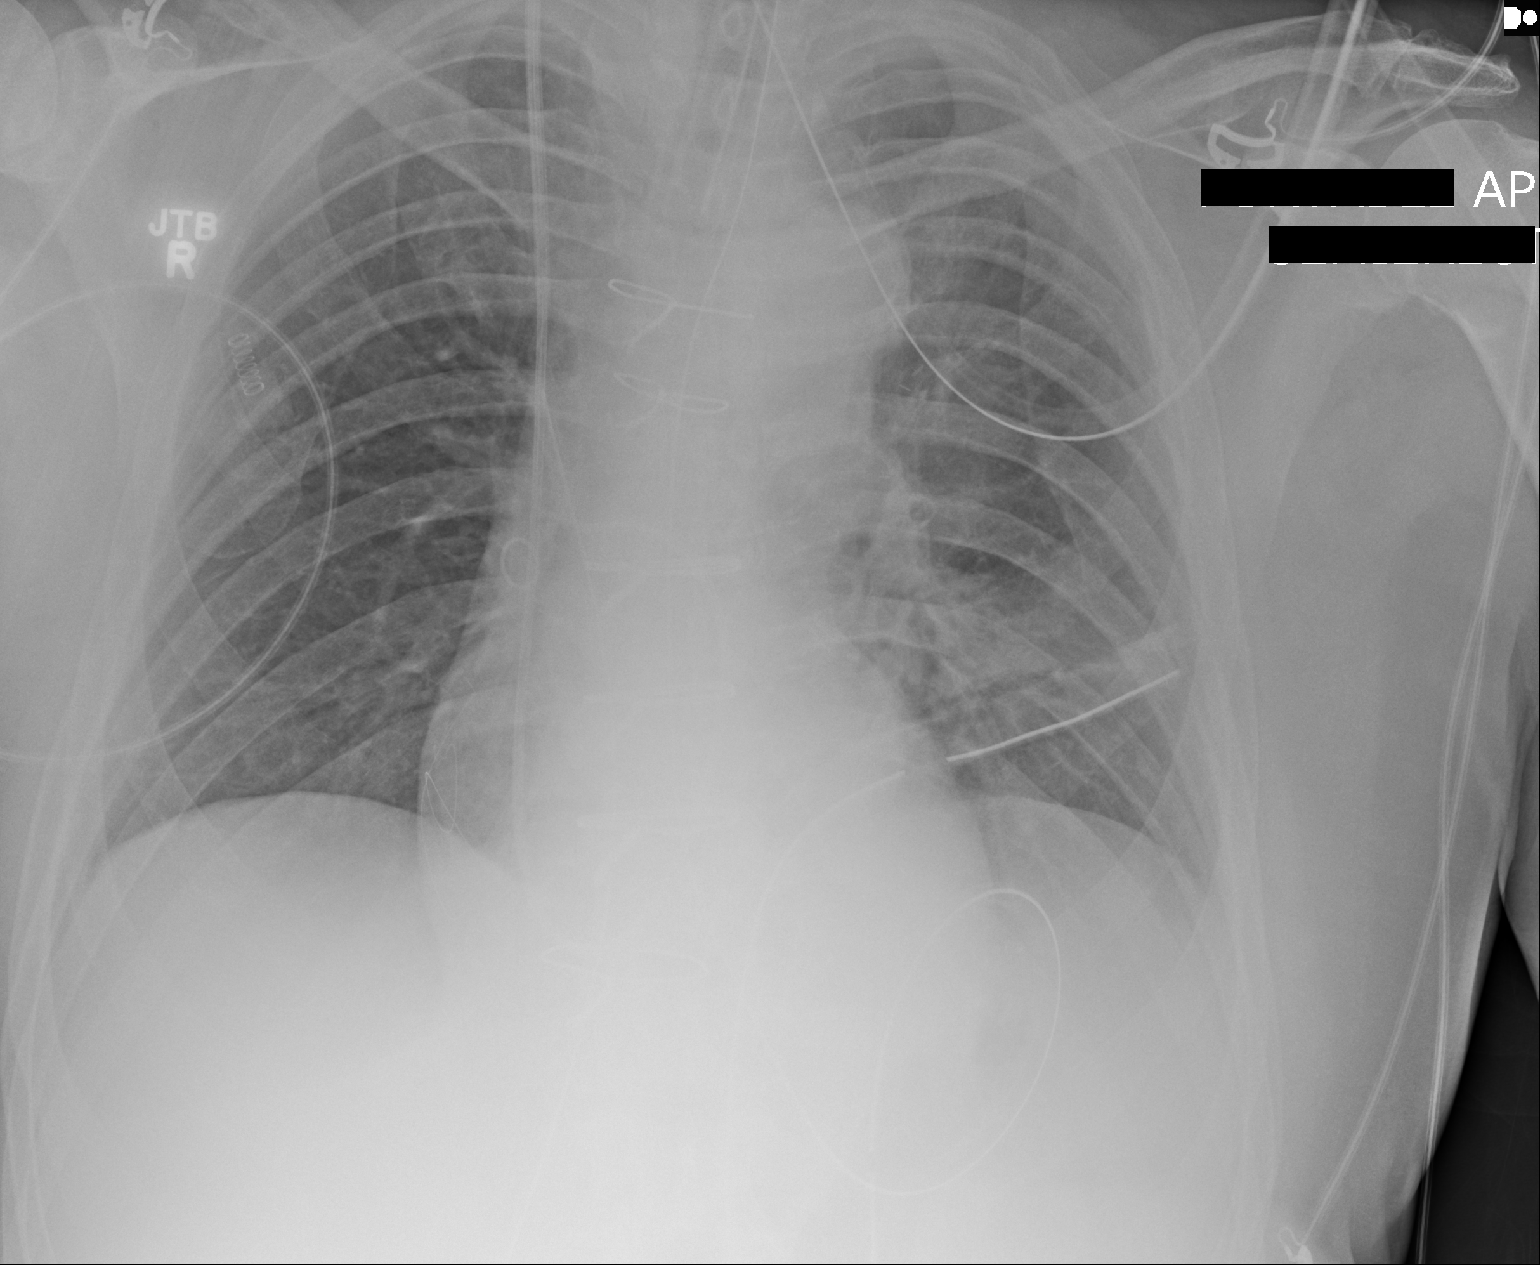

[1 of 1 positions shown; findings below may reference images not displayed]

FINDINGS: Endotracheal tube is in place with the tip in good position at the
clavicular heads. Tip of right IJ approach Swan-Ganz catheter is in
the right main pulmonary artery. NG tube is in the stomach.
Mediastinal drain and left chest tube are noted.

The lungs are clear. There is no pulmonary edema. No pneumothorax
identified. No pleural effusion. Heart size is normal.
IMPRESSION: Support apparatus projects in good position.  No acute disease.

## 2015-06-20 ENCOUNTER — Ambulatory Visit (INDEPENDENT_AMBULATORY_CARE_PROVIDER_SITE_OTHER): Payer: Medicare PPO | Admitting: Cardiology

## 2015-06-20 ENCOUNTER — Encounter: Payer: Self-pay | Admitting: Cardiology

## 2015-06-20 VITALS — BP 132/82 | HR 50 | Ht 73.0 in | Wt 221.1 lb

## 2015-06-20 DIAGNOSIS — E7800 Pure hypercholesterolemia, unspecified: Secondary | ICD-10-CM

## 2015-06-20 DIAGNOSIS — Z951 Presence of aortocoronary bypass graft: Secondary | ICD-10-CM

## 2015-06-20 DIAGNOSIS — I25810 Atherosclerosis of coronary artery bypass graft(s) without angina pectoris: Secondary | ICD-10-CM | POA: Diagnosis not present

## 2015-06-20 DIAGNOSIS — I10 Essential (primary) hypertension: Secondary | ICD-10-CM

## 2015-06-20 DIAGNOSIS — E119 Type 2 diabetes mellitus without complications: Secondary | ICD-10-CM

## 2015-06-20 NOTE — Patient Instructions (Signed)
Continue your current therapy  I will see you in 6 months.   

## 2015-06-20 NOTE — Progress Notes (Signed)
06/20/2015   PCP: Coy Saunas, MD   Chief Complaint  Patient presents with  . Follow-up    occasionally feels tingling in right hand/ pt states no chest pain, SOB, swelling, fatigue, claudication    HPI:  Miguel Vasquez is seen for follow up CAD. He has a history of coronary disease and is status post stenting of the left circumflex coronary in 1996. He had stenting of the right coronary in 2002.  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  He remains on Zetia. Also on cholestyramine and fenofibrate.[name] He was seen in November for chest discomfort.  Stress test was ordered and was positive for inf. Ischemia.  EF 56%.  His angina was worse by this time and pt underwent cardiac cath, finding:  Severe coronary calcification and multivessel CAD with 60-70% ostial left main stenosis; segmental 50% proximal and mid LAD stenoses; 40% narrowing within the stented segment in the left circumflex coronary artery; and significant ostial RCA calcification with 95% ostial stenosis followed by a 90% proximal stenosis. An area of calcification with probable thrombus, and 40% mid RCA stenosis and a previously placed stent with a patent distal RCA stent.  Normal LV contractility with an ejection fraction of 55% and evidence for mild mid to basal inferior hypocontractility .  TCTS consulted and pt underwent CABG by Dr. Roxan Hockey with CORONARY ARTERY BYPASS GRAFTING (CABG) x 3 in November 2015 LIMA to LAD SVG to OM SVG to PL He has done well since then.  Denies any chest pain or SOB. No palpitations. He is walking 3-4 days/week and playing golf. Is trying to do better with diet.  Allergies  Allergen Reactions  . Statins Other (See Comments)    "Affects liver enzymes"    Current Outpatient Prescriptions  Medication Sig Dispense Refill  . acetaminophen (TYLENOL) 500 MG tablet Take 500 mg by mouth every 8 (eight) hours as needed for moderate pain.    [name]  allopurinol (ZYLOPRIM) 300 MG tablet Take 150 mg by mouth daily.     [name] aspirin 81 MG tablet Take 81 mg by mouth daily.    [name] atenolol (TENORMIN) 25 MG tablet Take 25 mg by mouth 2 (two) times daily.     . cholestyramine (QUESTRAN) 4 G packet Take by mouth 2 (two) times daily.     [name] ezetimibe (ZETIA) 10 MG tablet Take 10 mg by mouth daily.      . fenofibrate (TRICOR) 145 MG tablet Take 1 tablet (145 mg total) by mouth daily.    [name] FOLIC ACID PO Take 245 mg by mouth daily.     [name] levothyroxine (SYNTHROID, LEVOTHROID) 100 MCG tablet Take 100 mcg by mouth daily.      . metFORMIN (GLUCOPHAGE) 500 MG tablet Take 500 mg by mouth 2 (two) times daily with a meal.     . nitroGLYCERIN (NITROSTAT) 0.4 MG SL tablet Place 0.4 mg under the tongue every 5 (five) minutes as needed. DISSOLVE ONE TABLET UNDER THE TONGUE EVERY 5 MINUTES AS NEEDED FOR CHEST PAIN.  DO NOT EXCEED A TOTAL OF 3 DOSES IN 15 MINUTES    . pantoprazole (PROTONIX) 40 MG tablet Take 1 tablet (40 mg total) by mouth daily. 30 tablet 11  . PARoxetine (PAXIL) 20 MG tablet Take 20 mg by mouth daily.      . ramipril (ALTACE) 10 MG capsule Take 10 mg by mouth daily.     No current  facility-administered medications for this visit.    Past Medical History  Diagnosis Date  . Hypercholesterolemia   . Coronary disease     Status post stenting of the left circumflex coronary in 1996--status post stenting of the right coronary in 2002, CABG 09/2014  . Hypertension     Controlled  . Diabetes mellitus     Type 2  . Hemochromatosis   . Obesity   . History of ulcer disease   . Panic disorder     Hx of panic disorder  . Cervical disc disease     History of Cervical Disc Disease  . Myocardial infarction 2002  . Cataracts, bilateral   . Basal cell carcinoma, face   . S/P tonsillectomy   . Trigger finger   . Hypothyroid     Past Surgical History  Procedure Laterality Date  . Coronary stent placement      x3  . Coronary artery bypass graft N/A  10/03/2014    Procedure: CORONARY ARTERY BYPASS GRAFTING (CABG) x  three, using left internal mammary artery and right leg greater saphenous vein harvested endoscopically;  Surgeon: Melrose Nakayama, MD;  Location: [place];  Service: Open Heart Surgery;  Laterality: N/A;  . Tee without cardioversion N/A 10/03/2014    Procedure: TRANSESOPHAGEAL ECHOCARDIOGRAM (TEE);  Surgeon: Melrose Nakayama, MD;  Location: [place];  Service: Open Heart Surgery;  Laterality: N/A;  . Cardiac catheterization    . Left heart catheterization with coronary angiogram N/A 09/28/2014    Procedure: LEFT HEART CATHETERIZATION WITH CORONARY ANGIOGRAM;  Surgeon: Troy Sine, MD;  Location: [facility] CATH LAB;  Service: Cardiovascular;  Laterality: N/A;    ROS:As noted in HPI. All other systems are reviewed and are negative.  Wt Readings from Last 3 Encounters:  06/20/15 100.29 kg (221 lb 1.6 oz)  12/05/14 99.066 kg (218 lb 6.4 oz)  11/27/14 98.884 kg (218 lb)    PHYSICAL EXAM BP 132/82 mmHg  Pulse 50  Ht 6\' 1"  (1.854 m)  Wt 100.29 kg (221 lb 1.6 oz)  BMI 29.18 kg/m2 General:Pleasant affect, NAD Skin:Warm and dry, brisk capillary refill HEENT:normocephalic, sclera clear, mucus membranes moist Neck:supple, no JVD, no bruits  Heart:S1S2 RRR without murmur, gallup, rub or click Lungs:clear without rales, rhonchi, or wheezes KJZ:PHXT, non tender, + BS, do not palpate liver spleen or masses Ext:no lower ext edema, 2+ pedal pulses, 2+ radial pulses Neuro:alert and oriented, MAE, follows commands, + facial symmetry   ASSESSMENT AND PLAN 1. CAD with remote PCI. S/p CABG in November 2015. He has made a nice recovery. Will continue medical therapy. Encourage heart healthy diet.   2. Hyperlipidemia. Intolerant to statins. Medical therapy as noted above. Labs followed by Dr. Rosana Hoes.   3. HTN controlled.   4. DM type 2- per primary care.   I will follow up in 6 months.

## 2015-10-23 ENCOUNTER — Ambulatory Visit (INDEPENDENT_AMBULATORY_CARE_PROVIDER_SITE_OTHER): Payer: Medicare PPO | Admitting: Pharmacist Clinician (PhC)/ Clinical Pharmacy Specialist

## 2015-10-23 ENCOUNTER — Encounter: Payer: Self-pay | Admitting: Pharmacist Clinician (PhC)/ Clinical Pharmacy Specialist

## 2015-10-23 VITALS — Ht 73.0 in | Wt 228.0 lb

## 2015-10-23 DIAGNOSIS — E7800 Pure hypercholesterolemia, unspecified: Secondary | ICD-10-CM | POA: Diagnosis not present

## 2015-10-23 NOTE — Progress Notes (Signed)
10/23/2015 Miguel Vasquez [birth year] [id number]   HPI:  Miguel Vasquez is a 68 y.o. male patient of Dr Martinique, who presents today for a lipid clinic evaluation.     RF:  CABG x 3 (Nov 15)  Meds: ezetimibe 10 mg qd, fish oil 1 gm bid  Previously tried: cholestyramine powder (taste issues), fenofibrate (stopped when started Crestor to decrease risk of myositis)   Intolerant:  Crestor (4 months, stopped just in past month) - myositis and elevated CK, atorvastatin 20 and pravastatin 20 both several years ago, caused myositis and elevated CK  Social history - no tobacco, quit 20 years ago; drinks beer, average of 2 per day  Family history: 1 sister with MI  Diet: admits bread is his weakness, lunch is mostly processed meats, plenty of ground beef, homemade soups, trying to increase his vegetables and beans; now eating cereal with fruit most days  Exercise: golf twice weekly, walks most other days, at least 2 miles    Labs:  09/2015  TC 266, TG 225, HDL 45, LDL 176 06/2015    TC 247, TG 172, HDL 42, LDL 171   Current Outpatient Prescriptions  Medication Sig Dispense Refill  . acetaminophen (TYLENOL) 500 MG tablet Take 500 mg by mouth every 8 (eight) hours as needed for moderate pain.    [name] allopurinol (ZYLOPRIM) 300 MG tablet Take 150 mg by mouth daily.     [name] aspirin 81 MG tablet Take 81 mg by mouth daily.    [name] atenolol (TENORMIN) 25 MG tablet Take 25 mg by mouth 2 (two) times daily.     . cholestyramine (QUESTRAN) 4 G packet Take by mouth 2 (two) times daily.     [name] ezetimibe (ZETIA) 10 MG tablet Take 10 mg by mouth daily.      . fenofibrate (TRICOR) 145 MG tablet Take 1 tablet (145 mg total) by mouth daily.    [name] FOLIC ACID PO Take [id number] mg by mouth daily.     [name] levothyroxine (SYNTHROID, LEVOTHROID) 100 MCG tablet Take 100 mcg by mouth daily.      . metFORMIN (GLUCOPHAGE) 500 MG tablet Take 500 mg by mouth 2 (two) times daily with a meal.     . nitroGLYCERIN (NITROSTAT) 0.4 MG SL tablet  Place 0.4 mg under the tongue every 5 (five) minutes as needed. DISSOLVE ONE TABLET UNDER THE TONGUE EVERY 5 MINUTES AS NEEDED FOR CHEST PAIN.  DO NOT EXCEED A TOTAL OF 3 DOSES IN 15 MINUTES    . pantoprazole (PROTONIX) 40 MG tablet Take 1 tablet (40 mg total) by mouth daily. 30 tablet 11  . PARoxetine (PAXIL) 20 MG tablet Take 20 mg by mouth daily.      . ramipril (ALTACE) 10 MG capsule Take 10 mg by mouth daily.     No current facility-administered medications for this visit.    Allergies  Allergen Reactions  . Statins Other (See Comments)    "Affects liver enzymes"    Past Medical History  Diagnosis Date  . Hypercholesterolemia   . Coronary disease     Status post stenting of the left circumflex coronary in 1996--status post stenting of the right coronary in 2002, CABG 09/2014  . Hypertension     Controlled  . Diabetes mellitus     Type 2  . Hemochromatosis   . Obesity   . History of ulcer disease   . Panic disorder     Hx of panic disorder  . Cervical disc  disease     History of Cervical Disc Disease  . Myocardial infarction ([place]) 2002  . Cataracts, bilateral   . Basal cell carcinoma, face   . S/P tonsillectomy   . Trigger finger   . Hypothyroid     Height 6\' 1"  (1.854 m), weight 228 lb (103.42 kg).   ASSESSMENT AND PLAN:  Tommy Medal PharmD CPP [facility]

## 2015-10-23 NOTE — Assessment & Plan Note (Signed)
Pt with elevated triglycerides and LDL.  Stopped fenofibrate to try Crestor, but was unable to tolerate due to myositis and elevated CK.  Recommended that he restart the fenofibrate (with food) and increase his fish oil to 2 capsules twice daily.  He should qualify for PCSK-9 inhibitor therapy, however he will be switching insurance plans as of January 1.  He is concerned about the potential costs of this and doesn't think he will be able to afford a high monthly copay.  Will put in the paperwork after January 1 and determine costs.  Will follow up with patient once that is determined

## 2015-10-23 NOTE — Patient Instructions (Signed)
Start fenofibrate 145 mg daily Increase fish oil to 2 capsules twice dialy    Cholesterol Cholesterol is a fat. Your body needs a small amount of cholesterol. Cholesterol may build up in your blood vessels. This increases your chance of having a heart attack or stroke. You cannot feel your cholesterol levels. The only way to know your cholesterol level is high is with a blood test. Keep your test results. Work with your doctor to keep your cholesterol at a good level. WHAT DO THE TEST RESULTS MEAN?  Total cholesterol is how much cholesterol is in your blood.  LDL is bad cholesterol. This is the type that can build up. You want LDL to be low.  HDL is good cholesterol. It cleans your blood vessels and carries LDL away. You want HDL to be high.  Triglycerides are fat that the body can burn for energy or store. WHAT ARE GOOD LEVELS OF CHOLESTEROL?  Total cholesterol below 200.  LDL below 100 for people at risk. Below 70 for those at very high risk.  HDL above 50 is good. Above 60 is best.  Triglycerides below 150. HOW CAN I LOWER MY CHOLESTEROL?  Diet. Follow your diet programs as told by your doctor.  Choose fish, white meat chicken, roasted [place], or baked [place]. Try not to eat red meat, fried foods, or processed meats such as sausage and lunch meats.  Eat lots of fresh fruits and vegetables.  Choose whole grains, beans, pasta, potatoes, and cereals.  Use only small amounts of olive, corn, or canola oils.  Try not to eat butter, mayonnaise, shortening, or palm kernel oils.  Try not to eat foods with trans fats.  Drink skim or nonfat milk. Eat low-fat or nonfat yogurt and cheeses. Try not to drink whole milk or cream. Try not to eat ice cream, egg yolks, and full-fat cheeses.  Healthy desserts include angel food cake, ginger snaps, animal crackers, hard candy, popsicles, and low-fat or nonfat frozen yogurt. Try not to eat pastries, cakes, pies, and cookies.  Exercise.  Follow your exercise programs as told by your doctor.  Be more active. You can try gardening, walking, or taking the stairs. Ask your doctor about how you can be more active.  Medicine. Take medicine as told by your doctor.   This information is not intended to replace advice given to you by your health care provider. Make sure you discuss any questions you have with your health care provider.   Document Released: 01/22/2009 Document Revised: 11/16/2014 Document Reviewed: 08/09/2013 Elsevier Interactive Patient Education 2016 [place] Choices to Lower Your Triglycerides Triglycerides are a type of fat in your blood. High levels of triglycerides can increase the risk of heart disease and stroke. If your triglyceride levels are high, the foods you eat and your eating habits are very important. Choosing the right foods can help lower your triglycerides.  WHAT GENERAL GUIDELINES DO I NEED TO FOLLOW?  Lose weight if you are overweight.   Limit or avoid alcohol.   Fill one half of your plate with vegetables and green salads.   Limit fruit to two servings a day. Choose fruit instead of juice.   Make one fourth of your plate whole grains. Look for the word "whole" as the first word in the ingredient list.  Fill one fourth of your plate with lean protein foods.  Enjoy fatty fish (such as salmon, mackerel, sardines, and tuna) three times a week.   Choose healthy  fats.   Limit foods high in starch and sugar.  Eat more home-cooked food and less restaurant, buffet, and fast food.  Limit fried foods.  Cook foods using methods other than frying.  Limit saturated fats.  Check ingredient lists to avoid foods with partially hydrogenated oils (trans fats) in them. WHAT FOODS CAN I EAT?  Grains Whole grains, such as whole wheat or whole grain breads, crackers, cereals, and pasta. Unsweetened oatmeal, bulgur, barley, quinoa, or brown rice. Corn or whole wheat flour tortillas.   Vegetables Fresh or frozen vegetables (raw, steamed, roasted, or grilled). Green salads. Fruits All fresh, canned (in natural juice), or frozen fruits. Meat and Other Protein Products Ground beef (85% or leaner), grass-fed beef, or beef trimmed of fat. Skinless chicken or [place]. Ground chicken or [place]. Pork trimmed of fat. All fish and seafood. Eggs. Dried beans, peas, or lentils. Unsalted nuts or seeds. Unsalted canned or dry beans. Dairy Low-fat dairy products, such as skim or 1% milk, 2% or reduced-fat cheeses, low-fat ricotta or cottage cheese, or plain low-fat yogurt. Fats and Oils Tub margarines without trans fats. Light or reduced-fat mayonnaise and salad dressings. Avocado. Safflower, olive, or canola oils. Natural peanut or almond butter. The items listed above may not be a complete list of recommended foods or beverages. Contact your dietitian for more options. WHAT FOODS ARE NOT RECOMMENDED?  Grains White bread. White pasta. White rice. Cornbread. Bagels, pastries, and croissants. Crackers that contain trans fat. Vegetables White potatoes. Corn. Creamed or fried vegetables. Vegetables in a cheese sauce. Fruits Dried fruits. Canned fruit in light or heavy syrup. Fruit juice. Meat and Other Protein Products Fatty cuts of meat. Ribs, chicken wings, bacon, sausage, bologna, salami, chitterlings, fatback, hot dogs, bratwurst, and packaged luncheon meats. Dairy Whole or 2% milk, cream, half-and-half, and cream cheese. Whole-fat or sweetened yogurt. Full-fat cheeses. Nondairy creamers and whipped toppings. Processed cheese, cheese spreads, or cheese curds. Sweets and Desserts Corn syrup, sugars, honey, and molasses. Candy. Jam and jelly. Syrup. Sweetened cereals. Cookies, pies, cakes, donuts, muffins, and ice cream. Fats and Oils Butter, stick margarine, lard, shortening, ghee, or bacon fat. Coconut, palm kernel, or palm oils. Beverages Alcohol. Sweetened drinks (such as sodas,  lemonade, and fruit drinks or punches). The items listed above may not be a complete list of foods and beverages to avoid. Contact your dietitian for more information.   This information is not intended to replace advice given to you by your health care provider. Make sure you discuss any questions you have with your health care provider.   Document Released: 08/13/2004 Document Revised: 11/16/2014 Document Reviewed: 08/30/2013 Elsevier Interactive Patient Education [employer].

## 2015-11-15 ENCOUNTER — Other Ambulatory Visit: Payer: Self-pay | Admitting: Pharmacist Clinician (PhC)/ Clinical Pharmacy Specialist

## 2015-11-15 DIAGNOSIS — R079 Chest pain, unspecified: Secondary | ICD-10-CM

## 2015-11-15 DIAGNOSIS — I2510 Atherosclerotic heart disease of native coronary artery without angina pectoris: Secondary | ICD-10-CM

## 2015-11-15 MED ORDER — OMEGA-3-ACID ETHYL ESTERS 1 G PO CAPS: 2.0000 g | ORAL_CAPSULE | Freq: Two times a day (BID) | ORAL | Status: DC

## 2015-11-15 MED ORDER — FENOFIBRATE 145 MG PO TABS: 145.0000 mg | ORAL_TABLET | Freq: Every day | ORAL | Status: DC

## 2015-12-27 ENCOUNTER — Ambulatory Visit (INDEPENDENT_AMBULATORY_CARE_PROVIDER_SITE_OTHER): Payer: Medicare Other | Admitting: Cardiology

## 2015-12-27 ENCOUNTER — Encounter: Payer: Self-pay | Admitting: Cardiology

## 2015-12-27 VITALS — BP 140/80 | HR 44 | Ht 74.0 in | Wt 224.5 lb

## 2015-12-27 DIAGNOSIS — Z951 Presence of aortocoronary bypass graft: Secondary | ICD-10-CM | POA: Diagnosis not present

## 2015-12-27 DIAGNOSIS — I25810 Atherosclerosis of coronary artery bypass graft(s) without angina pectoris: Secondary | ICD-10-CM

## 2015-12-27 DIAGNOSIS — I10 Essential (primary) hypertension: Secondary | ICD-10-CM | POA: Diagnosis not present

## 2015-12-27 DIAGNOSIS — E7800 Pure hypercholesterolemia, unspecified: Secondary | ICD-10-CM

## 2015-12-27 NOTE — Progress Notes (Signed)
12/27/2015   PCP: Coy Saunas, MD   No chief complaint on file.   HPI:  Miguel Vasquez is seen for follow up CAD. He has a history of coronary disease and is status post stenting of the left circumflex coronary in 1996. He had stenting of the right coronary in 2002.  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  He remains on Zetia and fenofibrate..In November 2015  Stress test was ordered and was positive for inf. Ischemia.  EF 56%.  His angina was worse by this time and pt underwent cardiac cath, finding:  Severe 3 vessel coronary artery disease.  Normal LV contractility with an ejection fraction of 55% and evidence for mild mid to basal inferior hypocontractility . Patient then  underwent CABG by Dr. Roxan Hockey with [place] (CABG) x 3 with LIMA to LAD,SVG to OM,SVG to PL He was seen in our lipid clinic in November. PCSK 9 inhibitor discussed but since he was changing insurance decision regarding this was postponed. On follow up today he is feeling very well. No chest pain or dyspnea. Energy level is good. No dizziness. He golfs 2 days a week and walks 2 miles 4 days a week. Reports BP at home consistently less than 130/80.   Allergies  Allergen Reactions  . Statins Other (See Comments)    "Affects liver enzymes"    Current Outpatient Prescriptions  Medication Sig Dispense Refill  . acetaminophen (TYLENOL) 500 MG tablet Take 500 mg by mouth every 8 (eight) hours as needed for moderate pain.    [name] allopurinol (ZYLOPRIM) 300 MG tablet Take 150 mg by mouth daily.     [name] aspirin 81 MG tablet Take 81 mg by mouth daily.    [name] atenolol (TENORMIN) 25 MG tablet Take 25 mg by mouth 2 (two) times daily.     . cholestyramine (QUESTRAN) 4 G packet Take by mouth 2 (two) times daily.     [name] ezetimibe (ZETIA) 10 MG tablet Take 10 mg by mouth daily.      . fenofibrate (TRICOR) 145 MG tablet Take 1 tablet (145 mg total) by mouth daily. 30 tablet 6  . FOLIC ACID PO Take  [id number] mg by mouth daily.     [name] levothyroxine (SYNTHROID, LEVOTHROID) 100 MCG tablet Take 100 mcg by mouth daily.      . metFORMIN (GLUCOPHAGE) 500 MG tablet Take 500 mg by mouth 2 (two) times daily with a meal.     . nitroGLYCERIN (NITROSTAT) 0.4 MG SL tablet Place 0.4 mg under the tongue every 5 (five) minutes as needed. DISSOLVE ONE TABLET UNDER THE TONGUE EVERY 5 MINUTES AS NEEDED FOR CHEST PAIN.  DO NOT EXCEED A TOTAL OF 3 DOSES IN 15 MINUTES    . omega-3 acid ethyl esters (LOVAZA) 1 g capsule Take 2 capsules (2 g total) by mouth 2 (two) times daily. 120 capsule 6  . pantoprazole (PROTONIX) 40 MG tablet Take 1 tablet (40 mg total) by mouth daily. 30 tablet 11  . PARoxetine (PAXIL) 20 MG tablet Take 20 mg by mouth daily.      . ramipril (ALTACE) 10 MG capsule Take 10 mg by mouth daily.     No current facility-administered medications for this visit.    Past Medical History  Diagnosis Date  . Hypercholesterolemia   . Coronary disease     Status post stenting of the left circumflex coronary in 1996--status post stenting  of the right coronary in 2002, CABG 09/2014  . Hypertension     Controlled  . Diabetes mellitus     Type 2  . Hemochromatosis   . Obesity   . History of ulcer disease   . Panic disorder     Hx of panic disorder  . Cervical disc disease     History of Cervical Disc Disease  . Myocardial infarction ([place]) 2002  . Cataracts, bilateral   . Basal cell carcinoma, face   . S/P tonsillectomy   . Trigger finger   . Hypothyroid     Past Surgical History  Procedure Laterality Date  . Coronary stent placement      x3  . Coronary artery bypass graft N/A 10/03/2014    Procedure: CORONARY ARTERY BYPASS GRAFTING (CABG) x  three, using left internal mammary artery and right leg greater saphenous vein harvested endoscopically;  Surgeon: Melrose Nakayama, MD;  Location: [place] [facility];  Service: Open Heart Surgery;  Laterality: N/A;  . Tee without cardioversion N/A 10/03/2014     Procedure: TRANSESOPHAGEAL ECHOCARDIOGRAM (TEE);  Surgeon: Melrose Nakayama, MD;  Location: [facility];  Service: Open Heart Surgery;  Laterality: N/A;  . Cardiac catheterization    . Left heart catheterization with coronary angiogram N/A 09/28/2014    Procedure: LEFT HEART CATHETERIZATION WITH CORONARY ANGIOGRAM;  Surgeon: Troy Sine, MD;  Location[facility] C[facility] LAB;  Service: Cardiovascular;  Laterality: N/A;    ROS:As noted in HPI. All other systems are reviewed and are negative.  Wt Readings from Last 3 Encounters:  10/23/15 103.42 kg (228 lb)  06/20/15 100.29 kg (221 lb 1.6 oz)  12/05/14 99.066 kg (218 lb 6.4 oz)    PHYSICAL EXAM There were no vitals taken for this visit. General:Pleasant affect, NAD Skin:Warm and dry, brisk capillary refill HEENT:normocephalic, sclera clear, mucus membranes moist Neck:supple, no JVD, no bruits  Heart:S1S2 RRR without murmur, gallup, rub or click Lungs:clear without rales, rhonchi, or wheezes [id number], non tender, + BS, do not palpate liver spleen or masses Ext:no lower ext edema, 2+ pedal pulses, 2+ radial pulses Neuro:alert and oriented, MAE, follows commands, + facial symmetry  Laboratory data:  Ecg today shows marked sinus bradycardia with rate 44. Otherwise normal. I have personally reviewed and interpreted this study.  ASSESSMENT AND PLAN 1. CAD with remote PCI. S/p CABG in November 2015. He is doing very well.  Will continue medical therapy. Encourage heart healthy diet.   2. Hyperlipidemia. Intolerant to statins. Medical therapy as noted above. Planning to have lab work repeated next month with Dr. Rosana Hoes. Will then follow up in our lipid clinic. ? PCSK 9 inhibitor.   3. HTN controlled.   4. DM type 2- per primary care.   5. Marked sinus bradycardia. Asymptomatic. Recommend stopping beta blocker at this time. Continue ramapril for BP control.   I will follow up in 6 months.

## 2015-12-27 NOTE — Patient Instructions (Signed)
Stop taking atenolol  We will see how your cholesterol looks on blood work next month.  I will see you in 6 months

## 2016-04-28 ENCOUNTER — Telehealth: Payer: Self-pay | Admitting: Cardiology

## 2016-04-28 NOTE — Telephone Encounter (Signed)
Miguel Vasquez is calling because he is having some Chest Discomfort , wants to speak with  Malachy Mood

## 2016-04-28 NOTE — Telephone Encounter (Signed)
Returned call to patient.He stated he has been having pain in bottom of throat for the past couple of weeks.Stated he walked 1&1/2 miles this morning and noticed pain in bottom of throat while walking.Stated no pain at present.Appointment scheduled with Sharrell Ku PA Friday 05/01/16 at 1:30 pm at [facility] office.Advised to go to ER if needed.

## 2016-04-30 NOTE — Progress Notes (Addendum)
Cardiology Office Note    Date:  05/01/2016  ID:  Miguel Vasquez, DOB Jul 16, 1946, MRN [id number] PCP:  Coy Saunas, MD  Cardiologist:  Dr. Martinique   Chief Complaint: chest pain  History of Present Illness:  Miguel Vasquez is a 70 y.o. male with history of CAD (stent to Cx 1996, stent to RCA 2002, s/p CABGx3 in 2015), HLD (intolerant of statins due to elevated liver enzymes), HTN, DM, hemochromatosis, cervical disc disease, panic disorder, hypothyroidism, h/o marked sinus bradycardia (BB stopped) who presents for evaluation of chest pain. Per chart his last ischemic eval was the nuc that prompted cath prior to CABG in 2015. He was seen in lipid clinic 10/2015 to consider PCSK9 inhibitor but this was postponed since he was changing insurance. Last 2D Echo 09/2014: mild-mod LVH, EF 55-60%, cannot exclude RWMA, mild AI.  He presents for evaluation of chest discomfort. This has been occurring in his upper throat about twice a month lasting less than a minute at a time. It occurs at random and resolves spontaneously. He has not had any lower chest pain, SOB, nausea, vomiting or syncope. He has been able to play golf without provoking any discomfort. He has noticed by the 14th hole he feels fatigued. Symptoms are grossly atypical but he raises concern because the throat discomfort reminds him of what prompted the stress test that lead to the CABG.  Past Medical History  Diagnosis Date  . Hypercholesterolemia   . Coronary disease     Status post stenting of the left circumflex coronary in 1996--status post stenting of the right coronary in 2002, CABG 09/2014  . Hypertension     Controlled  . Diabetes mellitus     Type 2  . Hemochromatosis   . Obesity   . History of ulcer disease   . Panic disorder     Hx of panic disorder  . Cervical disc disease     History of Cervical Disc Disease  . Myocardial infarction ([place]) 2002  . Cataracts, bilateral   . Basal cell carcinoma, face   . S/P  tonsillectomy   . Trigger finger   . Hypothyroid   . Sinus bradycardia     Past Surgical History  Procedure Laterality Date  . Coronary stent placement      x3  . Coronary artery bypass graft N/A 10/03/2014    Procedure: CORONARY ARTERY BYPASS GRAFTING (CABG) x  three, using left internal mammary artery and right leg greater saphenous vein harvested endoscopically;  Surgeon: Melrose Nakayama, MD;  Location: [place];  Service: Open Heart Surgery;  Laterality: N/A;  . Tee without cardioversion N/A 10/03/2014    Procedure: TRANSESOPHAGEAL ECHOCARDIOGRAM (TEE);  Surgeon: Melrose Nakayama, MD;  Location: [place];  Service: Open Heart Surgery;  Laterality: N/A;  . Cardiac catheterization    . Left heart catheterization with coronary angiogram N/A 09/28/2014    Procedure: LEFT HEART CATHETERIZATION WITH CORONARY ANGIOGRAM;  Surgeon: Troy Sine, MD;  Location: [facility] CATH LAB;  Service: Cardiovascular;  Laterality: N/A;    Current Medications: Current Outpatient Prescriptions  Medication Sig Dispense Refill  . acetaminophen (TYLENOL) 500 MG tablet Take 500 mg by mouth every 8 (eight) hours as needed for moderate pain.    [name] allopurinol (ZYLOPRIM) 300 MG tablet Take 150 mg by mouth daily.     [name] aspirin 81 MG tablet Take 81 mg by mouth daily.    [name] ezetimibe (ZETIA) 10 MG tablet Take 10 mg by mouth  daily.      [name] FOLIC ACID PO Take [id number] mg by mouth daily.     [name] levothyroxine (SYNTHROID, LEVOTHROID) 100 MCG tablet Take 100 mcg by mouth daily.      . metFORMIN (GLUCOPHAGE) 500 MG tablet Take 500 mg by mouth 2 (two) times daily with a meal.     . nitroGLYCERIN (NITROSTAT) 0.4 MG SL tablet Place 0.4 mg under the tongue every 5 (five) minutes as needed for chest pain. DISSOLVE ONE TABLET UNDER THE TONGUE EVERY 5 MINUTES AS NEEDED FOR CHEST PAIN.  DO NOT EXCEED A TOTAL OF 3 DOSES IN 15 MINUTES    . omega-3 acid ethyl esters (LOVAZA) 1 g capsule Take 2 capsules (2 g total) by mouth 2 (two) times daily.  120 capsule 6  . pantoprazole (PROTONIX) 40 MG tablet Take 1 tablet (40 mg total) by mouth daily. 30 tablet 11  . PARoxetine (PAXIL) 20 MG tablet Take 20 mg by mouth daily.      . ramipril (ALTACE) 10 MG capsule Take 10 mg by mouth daily.     No current facility-administered medications for this visit.     Allergies:   Statins   Social History   Social History  . Marital Status: Married    Spouse Name: N/A  . Number of Children: 1  . Years of Education: N/A   Occupational History  . inspector [employer]    Social History Main Topics  . Smoking status: Former Smoker    Start date: 09/20/1995  . Smokeless tobacco: None  . Alcohol Use: Yes  . Drug Use: None  . Sexual Activity: Not Asked   Other Topics Concern  . None   Social History Narrative     Family History:  The patient's family history includes Cancer in his brother and mother; Heart attack in his sister; Heart failure in his mother.   ROS:   Please see the history of present illness.  All other systems are reviewed and otherwise negative.    PHYSICAL EXAM:   VS:  BP 140/86 mmHg  Pulse 68  Ht 6\' 2"  (1.88 m)  Wt 220 lb 9.6 oz (100.064 kg)  BMI 28.31 kg/m2  BMI: Body mass index is 28.31 kg/(m^2). GEN: Well nourished, well developed WM, in no acute distress HEENT: normocephalic, atraumatic Neck: no JVD, carotid bruits, or masses Cardiac: RRR; soft SEM at RUSB, preserved S2. No rubs or gallops, no edema  Respiratory:  clear to auscultation bilaterally, normal work of breathing GI: soft, nontender, nondistended, + BS MS: no deformity or atrophy Skin: warm and dry, no rash Neuro:  Alert and Oriented x 3, Strength and sensation are intact, follows commands Psych: euthymic mood, full affect  Wt Readings from Last 3 Encounters:  05/01/16 220 lb 9.6 oz (100.064 kg)  12/27/15 224 lb 8 oz (101.833 kg)  10/23/15 228 lb (103.42 kg)      Studies/Labs Reviewed:   EKG:  EKG was ordered today and personally reviewed by  me and demonstrates NSR 68bpm, nonspecific T wave changes III, V2.  Additional studies/ records that were reviewed today include: Summarized above.    ASSESSMENT & PLAN:   1. Chest pain - atypical symptoms. However, the patient is concerned given that this reminds him somewhat of what he felt before CABG. Will proceed with exercise nuclear stress testing to further evaluate. Warning sx reviewed with patient. Check BMET, CBC to ensure no obvious metabolic abnormalities when he returns for fasting lipids/liver. ADDENDUM  05/08/16 - nuc abnormal. See result note. Plan LHC. Also, Of note (already discussed with patient), page 10 of the EKG strips from his stress test also show an abrupt end to a narrow-complex tachycardia. I do not have any strips from the beginning of this event. The morphology raises question of atrial flutter. The patient has not had any palpitations. Reviewed with Dr. Martinique - hold off on anticoagulation for now as this may have been related to ischemia in the setting of exercise. We can consider event monitoring in follow-up. 2. Systolic murmur - no prior documentation of such. Update echo. 3. CAD - see above. Continue ASA, Zetia. Not on BB due to h/o marked sinus bradycardia. 4. Hypertension - BP modestly elevated. Will f/u BP from exercise test and echo findings and consider med titration based on these studies. 5. Hyperlipidemia - the patient is interested in PCSK-9 medication and is now finished switching insurance. Our nurse spoke with pharmD at [facility] who requested updated liver/fasting lipid panel. Will draw when he returns fasting in the next several days. He remains on Zetia at this time.   Disposition: F/u with Dr. Martinique or APP in 1 month.  Medication Adjustments/Labs and Tests Ordered: Current medicines are reviewed at length with the patient today.  Concerns regarding medicines are outlined above. Medication changes, Labs and Tests ordered today are summarized above  and listed in the Patient Instructions accessible in Encounters.   Raechel Ache PA-C  05/01/2016 1:52 PM    [facility] [address], [place], [place]  [address] Phone: [phone]; Fax: [phone]

## 2016-05-01 ENCOUNTER — Encounter: Payer: Self-pay | Admitting: Physician Assistant

## 2016-05-01 ENCOUNTER — Ambulatory Visit (INDEPENDENT_AMBULATORY_CARE_PROVIDER_SITE_OTHER): Payer: Medicare Other | Admitting: Physician Assistant

## 2016-05-01 VITALS — BP 140/86 | HR 68 | Ht 74.0 in | Wt 220.6 lb

## 2016-05-01 DIAGNOSIS — I2510 Atherosclerotic heart disease of native coronary artery without angina pectoris: Secondary | ICD-10-CM | POA: Diagnosis not present

## 2016-05-01 DIAGNOSIS — E785 Hyperlipidemia, unspecified: Secondary | ICD-10-CM | POA: Diagnosis not present

## 2016-05-01 DIAGNOSIS — I10 Essential (primary) hypertension: Secondary | ICD-10-CM

## 2016-05-01 DIAGNOSIS — I209 Angina pectoris, unspecified: Secondary | ICD-10-CM

## 2016-05-01 DIAGNOSIS — R079 Chest pain, unspecified: Secondary | ICD-10-CM

## 2016-05-01 DIAGNOSIS — R011 Cardiac murmur, unspecified: Secondary | ICD-10-CM

## 2016-05-01 NOTE — Patient Instructions (Addendum)
Medication Instructions:  Your physician recommends that you continue on your current medications as directed. Please refer to the Current Medication list given to you today.   Labwork: 05/04/16: Your physician recommends that you return for a FASTING lipid profile, CMET & CBC  Testing/Procedures: Your physician has requested that you have an echocardiogram. Echocardiography is a painless test that uses sound waves to create images of your heart. It provides your doctor with information about the size and shape of your heart and how well your heart's chambers and valves are working. This procedure takes approximately one hour. There are no restrictions for this procedure.  Your physician has requested that you have en exercise stress myoview. For further information please visit [email or web address]. Please follow instruction sheet, as given.    Follow-Up: Your physician recommends that you schedule a follow-up appointment in: 1 MONTH AFTER TESTING WITH DR. Martinique OR 1ST AVAILABLE APP   Any Other Special Instructions Will Be Listed Below (If Applicable).  Echocardiogram An echocardiogram, or echocardiography, uses sound waves (ultrasound) to produce an image of your heart. The echocardiogram is simple, painless, obtained within a short period of time, and offers valuable information to your health care provider. The images from an echocardiogram can provide information such as:  Evidence of coronary artery disease (CAD).  Heart size.  Heart muscle function.  Heart valve function.  Aneurysm detection.  Evidence of a past heart attack.  Fluid buildup around the heart.  Heart muscle thickening.  Assess heart valve function. LET [facility] CARE PROVIDER KNOW ABOUT:  Any allergies you have.  All medicines you are taking, including vitamins, herbs, eye drops, creams, and over-the-counter medicines.  Previous problems you or members of your family have had with the use of  anesthetics.  Any blood disorders you have.  Previous surgeries you have had.  Medical conditions you have.  Possibility of pregnancy, if this applies. BEFORE THE PROCEDURE  No special preparation is needed. Eat and drink normally.  PROCEDURE   In order to produce an image of your heart, gel will be applied to your chest and a wand-like tool (transducer) will be moved over your chest. The gel will help transmit the sound waves from the transducer. The sound waves will harmlessly bounce off your heart to allow the heart images to be captured in real-time motion. These images will then be recorded.  You may need an IV to receive a medicine that improves the quality of the pictures. AFTER THE PROCEDURE You may return to your normal schedule including diet, activities, and medicines, unless your health care provider tells you otherwise.   This information is not intended to replace advice given to you by your health care provider. Make sure you discuss any questions you have with your health care provider.   Document Released: 10/23/2000 Document Revised: 11/16/2014 Document Reviewed: 07/03/2013 Elsevier Interactive Patient Education [employer].    If you need a refill on your cardiac medications before your next appointment, please call your pharmacy.

## 2016-05-04 ENCOUNTER — Other Ambulatory Visit: Payer: Medicare Other

## 2016-05-05 ENCOUNTER — Ambulatory Visit: Payer: Medicare Other | Admitting: Nurse Practitioner

## 2016-05-05 ENCOUNTER — Telehealth: Payer: Self-pay | Admitting: Pharmacist

## 2016-05-05 NOTE — Telephone Encounter (Signed)
LM for patient to go to lab and have lipid panel drawn.

## 2016-05-07 ENCOUNTER — Encounter (HOSPITAL_COMMUNITY)
Admission: RE | Admit: 2016-05-07 | Discharge: 2016-05-07 | Disposition: A | Discharge status: Home / Self Care | Payer: Medicare Other | Source: Ambulatory Visit | Attending: Physician Assistant | Admitting: Physician Assistant

## 2016-05-07 ENCOUNTER — Other Ambulatory Visit (INDEPENDENT_AMBULATORY_CARE_PROVIDER_SITE_OTHER): Payer: Medicare Other | Admitting: *Deleted

## 2016-05-07 ENCOUNTER — Ambulatory Visit (HOSPITAL_COMMUNITY)
Admission: RE | Admit: 2016-05-07 | Discharge: 2016-05-07 | Disposition: A | Discharge status: Home / Self Care | Payer: Medicare Other | Source: Ambulatory Visit | Attending: Physician Assistant | Admitting: Physician Assistant

## 2016-05-07 DIAGNOSIS — I10 Essential (primary) hypertension: Secondary | ICD-10-CM | POA: Insufficient documentation

## 2016-05-07 DIAGNOSIS — Z87891 Personal history of nicotine dependence: Secondary | ICD-10-CM | POA: Diagnosis not present

## 2016-05-07 DIAGNOSIS — E119 Type 2 diabetes mellitus without complications: Secondary | ICD-10-CM | POA: Diagnosis not present

## 2016-05-07 DIAGNOSIS — R079 Chest pain, unspecified: Secondary | ICD-10-CM | POA: Diagnosis not present

## 2016-05-07 DIAGNOSIS — I2510 Atherosclerotic heart disease of native coronary artery without angina pectoris: Secondary | ICD-10-CM | POA: Insufficient documentation

## 2016-05-07 DIAGNOSIS — R011 Cardiac murmur, unspecified: Secondary | ICD-10-CM | POA: Diagnosis not present

## 2016-05-07 DIAGNOSIS — E785 Hyperlipidemia, unspecified: Secondary | ICD-10-CM

## 2016-05-07 DIAGNOSIS — I351 Nonrheumatic aortic (valve) insufficiency: Secondary | ICD-10-CM | POA: Diagnosis not present

## 2016-05-07 LAB — NM MYOCAR MULTI W/SPECT W/WALL MOTION / EF
CHL CUP RESTING HR STRESS: 64 {beats}/min
CSEPED: 0 min
CSEPEDS: 0 s
Estimated workload: 1 METS
MPHR: 153 {beats}/min
Peak HR: 141 {beats}/min
Percent HR: 92 %

## 2016-05-07 LAB — CBC
HCT: 44.5 % (ref 38.5–50.0)
Hemoglobin: 15.3 g/dL (ref 13.2–17.1)
MCH: 30.8 pg (ref 27.0–33.0)
MCHC: 34.4 g/dL (ref 32.0–36.0)
MCV: 89.7 fL (ref 80.0–100.0)
MPV: 10.9 fL (ref 7.5–12.5)
PLATELETS: 147 10*3/uL (ref 140–400)
RBC: 4.96 MIL/uL (ref 4.20–5.80)
RDW: 13 % (ref 11.0–15.0)
WBC: 4.1 10*3/uL (ref 3.8–10.8)

## 2016-05-07 LAB — ECHOCARDIOGRAM COMPLETE
CHL CUP MV DEC (S): 331
E decel time: 331 msec
E/e' ratio: 4.72
FS: 23 % — AB (ref 28–44)
IVS/LV PW RATIO, ED: 0.98
LA ID, A-P, ES: 37 mm
LA diam index: 1.63 cm/m2
LA vol: 37.6 mL
LAVOLA4C: 38 mL
LAVOLIN: 16.6 mL/m2
LEFT ATRIUM END SYS DIAM: 37 mm
LV E/e' medial: 4.72
LV E/e'average: 4.72
LV TDI E'MEDIAL: 4.68
LVELAT: 11 cm/s
LVOT area: 3.14 cm2
LVOT diameter: 20 mm
MV pk E vel: 51.9 m/s
MVPKAVEL: 64.5 m/s
P 1/2 time: 697 ms
PW: 15 mm — AB (ref 0.6–1.1)
TAPSE: 15.2 mm
TDI e' lateral: 11

## 2016-05-07 LAB — COMPREHENSIVE METABOLIC PANEL
ALT: 71 U/L — ABNORMAL HIGH (ref 9–46)
AST: 65 U/L — ABNORMAL HIGH (ref 10–35)
Albumin: 4.2 g/dL (ref 3.6–5.1)
Alkaline Phosphatase: 55 U/L (ref 40–115)
BUN: 15 mg/dL (ref 7–25)
CHLORIDE: 100 mmol/L (ref 98–110)
CO2: 25 mmol/L (ref 20–31)
CREATININE: 0.78 mg/dL (ref 0.70–1.25)
Calcium: 9.5 mg/dL (ref 8.6–10.3)
GLUCOSE: 150 mg/dL — AB (ref 65–99)
Potassium: 4.4 mmol/L (ref 3.5–5.3)
SODIUM: 138 mmol/L (ref 135–146)
TOTAL PROTEIN: 6.6 g/dL (ref 6.1–8.1)
Total Bilirubin: 1 mg/dL (ref 0.2–1.2)

## 2016-05-07 LAB — LIPID PANEL
CHOL/HDL RATIO: 5 ratio (ref <=5.0)
CHOLESTEROL: 250 mg/dL — AB (ref 125–200)
HDL: 50 mg/dL (ref >=40)
LDL Cholesterol: 156 mg/dL — ABNORMAL HIGH (ref <130)
Triglycerides: 218 mg/dL — ABNORMAL HIGH (ref <150)
VLDL: 44 mg/dL — AB (ref <30)

## 2016-05-07 MED ORDER — TECHNETIUM TC 99M TETROFOSMIN IV KIT
10.0000 | PACK | Freq: Once | INTRAVENOUS | Status: AC | PRN
  Administered 2016-05-07: 10 via INTRAVENOUS

## 2016-05-07 MED ORDER — TECHNETIUM TC 99M TETROFOSMIN IV KIT
30.0000 | PACK | Freq: Once | INTRAVENOUS | Status: AC | PRN
  Administered 2016-05-07: 30 via INTRAVENOUS

## 2016-05-07 NOTE — Progress Notes (Signed)
*  PRELIMINARY RESULTS* Echocardiogram 2D Echocardiogram has been performed.  Miguel Vasquez 05/07/2016, 9:42 AM

## 2016-05-08 ENCOUNTER — Other Ambulatory Visit: Payer: Self-pay | Admitting: Physician Assistant

## 2016-05-08 ENCOUNTER — Encounter: Payer: Self-pay | Admitting: *Deleted

## 2016-05-11 ENCOUNTER — Encounter (HOSPITAL_COMMUNITY)
Admission: RE | Disposition: A | Discharge status: Home / Self Care | Payer: Self-pay | Source: Ambulatory Visit | Attending: Interventional Cardiology

## 2016-05-11 ENCOUNTER — Encounter (HOSPITAL_COMMUNITY): Payer: Self-pay | Admitting: Interventional Cardiology

## 2016-05-11 ENCOUNTER — Other Ambulatory Visit: Payer: Self-pay | Admitting: Cardiology

## 2016-05-11 ENCOUNTER — Ambulatory Visit (HOSPITAL_COMMUNITY)
Admission: RE | Admit: 2016-05-11 | Discharge: 2016-05-11 | Disposition: A | Discharge status: Home / Self Care | Payer: Medicare Other | Source: Ambulatory Visit | Attending: Interventional Cardiology | Admitting: Interventional Cardiology

## 2016-05-11 DIAGNOSIS — E119 Type 2 diabetes mellitus without complications: Secondary | ICD-10-CM | POA: Diagnosis not present

## 2016-05-11 DIAGNOSIS — I2510 Atherosclerotic heart disease of native coronary artery without angina pectoris: Secondary | ICD-10-CM

## 2016-05-11 DIAGNOSIS — I252 Old myocardial infarction: Secondary | ICD-10-CM | POA: Diagnosis not present

## 2016-05-11 DIAGNOSIS — Z87891 Personal history of nicotine dependence: Secondary | ICD-10-CM | POA: Diagnosis not present

## 2016-05-11 DIAGNOSIS — Z7984 Long term (current) use of oral hypoglycemic drugs: Secondary | ICD-10-CM | POA: Insufficient documentation

## 2016-05-11 DIAGNOSIS — I25810 Atherosclerosis of coronary artery bypass graft(s) without angina pectoris: Secondary | ICD-10-CM | POA: Insufficient documentation

## 2016-05-11 DIAGNOSIS — E669 Obesity, unspecified: Secondary | ICD-10-CM | POA: Insufficient documentation

## 2016-05-11 DIAGNOSIS — E7800 Pure hypercholesterolemia, unspecified: Secondary | ICD-10-CM | POA: Insufficient documentation

## 2016-05-11 DIAGNOSIS — Z85828 Personal history of other malignant neoplasm of skin: Secondary | ICD-10-CM | POA: Insufficient documentation

## 2016-05-11 DIAGNOSIS — Z6828 Body mass index (BMI) 28.0-28.9, adult: Secondary | ICD-10-CM | POA: Diagnosis not present

## 2016-05-11 DIAGNOSIS — E039 Hypothyroidism, unspecified: Secondary | ICD-10-CM | POA: Insufficient documentation

## 2016-05-11 DIAGNOSIS — I2584 Coronary atherosclerosis due to calcified coronary lesion: Secondary | ICD-10-CM | POA: Diagnosis not present

## 2016-05-11 DIAGNOSIS — Z7982 Long term (current) use of aspirin: Secondary | ICD-10-CM | POA: Insufficient documentation

## 2016-05-11 DIAGNOSIS — I2582 Chronic total occlusion of coronary artery: Secondary | ICD-10-CM | POA: Diagnosis not present

## 2016-05-11 DIAGNOSIS — E785 Hyperlipidemia, unspecified: Secondary | ICD-10-CM | POA: Diagnosis not present

## 2016-05-11 DIAGNOSIS — R9439 Abnormal result of other cardiovascular function study: Secondary | ICD-10-CM | POA: Insufficient documentation

## 2016-05-11 DIAGNOSIS — I10 Essential (primary) hypertension: Secondary | ICD-10-CM | POA: Diagnosis not present

## 2016-05-11 DIAGNOSIS — Z955 Presence of coronary angioplasty implant and graft: Secondary | ICD-10-CM | POA: Diagnosis not present

## 2016-05-11 DIAGNOSIS — R001 Bradycardia, unspecified: Secondary | ICD-10-CM | POA: Insufficient documentation

## 2016-05-11 DIAGNOSIS — I209 Angina pectoris, unspecified: Secondary | ICD-10-CM

## 2016-05-11 DIAGNOSIS — F410 Panic disorder [episodic paroxysmal anxiety] without agoraphobia: Secondary | ICD-10-CM | POA: Insufficient documentation

## 2016-05-11 DIAGNOSIS — R011 Cardiac murmur, unspecified: Secondary | ICD-10-CM | POA: Diagnosis not present

## 2016-05-11 HISTORY — PX: CARDIAC CATHETERIZATION: SHX172

## 2016-05-11 LAB — PROTIME-INR
INR: 1.1 (ref 0.00–1.49)
PROTHROMBIN TIME: 14.4 s (ref 11.6–15.2)

## 2016-05-11 LAB — GLUCOSE, CAPILLARY
GLUCOSE-CAPILLARY: 162 mg/dL — AB (ref 65–99)
GLUCOSE-CAPILLARY: 153 mg/dL — AB (ref 65–99)

## 2016-05-11 SURGERY — LEFT HEART CATH AND CORS/GRAFTS ANGIOGRAPHY
Anesthesia: LOCAL

## 2016-05-11 MED ORDER — MIDAZOLAM HCL 2 MG/2ML IJ SOLN
INTRAMUSCULAR | Status: AC
  Filled 2016-05-11: qty 2

## 2016-05-11 MED ORDER — SODIUM CHLORIDE 0.9 % WEIGHT BASED INFUSION: 1.0000 mL/kg/h | INTRAVENOUS | Status: DC

## 2016-05-11 MED ORDER — SODIUM CHLORIDE 0.9% FLUSH: 3.0000 mL | Freq: Two times a day (BID) | INTRAVENOUS | Status: DC

## 2016-05-11 MED ORDER — LIDOCAINE HCL (PF) 1 % IJ SOLN
INTRAMUSCULAR | Status: DC | PRN
  Administered 2016-05-11: 4 mL via INTRADERMAL

## 2016-05-11 MED ORDER — SODIUM CHLORIDE 0.9% FLUSH: 3.0000 mL | INTRAVENOUS | Status: DC | PRN

## 2016-05-11 MED ORDER — CLOPIDOGREL BISULFATE 300 MG PO TABS
ORAL_TABLET | ORAL | Status: AC
  Filled 2016-05-11: qty 1

## 2016-05-11 MED ORDER — VERAPAMIL HCL 2.5 MG/ML IV SOLN
INTRAVENOUS | Status: AC
  Filled 2016-05-11: qty 2

## 2016-05-11 MED ORDER — HEPARIN SODIUM (PORCINE) 1000 UNIT/ML IJ SOLN
INTRAMUSCULAR | Status: DC | PRN
  Administered 2016-05-11: 5000 [IU] via INTRAVENOUS

## 2016-05-11 MED ORDER — IOPAMIDOL (ISOVUE-370) INJECTION 76%
INTRAVENOUS | Status: DC | PRN
  Administered 2016-05-11: 110 mL via INTRA_ARTERIAL

## 2016-05-11 MED ORDER — ASPIRIN 81 MG PO CHEW
81.0000 mg | CHEWABLE_TABLET | ORAL | Status: AC
Start: 2016-05-11 — End: 2016-05-11
  Administered 2016-05-11: 81 mg via ORAL

## 2016-05-11 MED ORDER — SODIUM CHLORIDE 0.9 % IV SOLN: 250.0000 mL | INTRAVENOUS | Status: DC | PRN

## 2016-05-11 MED ORDER — SODIUM CHLORIDE 0.9 % WEIGHT BASED INFUSION
3.0000 mL/kg/h | INTRAVENOUS | Status: AC
  Administered 2016-05-11: 3 mL/kg/h via INTRAVENOUS

## 2016-05-11 MED ORDER — HEPARIN (PORCINE) IN NACL 2-0.9 UNIT/ML-% IJ SOLN
INTRAMUSCULAR | Status: DC | PRN
  Administered 2016-05-11: 1500 mL

## 2016-05-11 MED ORDER — CLOPIDOGREL BISULFATE 75 MG PO TABS
75.0000 mg | ORAL_TABLET | Freq: Every day | ORAL | Status: DC
Start: 2016-05-11 — End: 2017-06-04

## 2016-05-11 MED ORDER — HEPARIN (PORCINE) IN NACL 2-0.9 UNIT/ML-% IJ SOLN
INTRAMUSCULAR | Status: DC | PRN
  Administered 2016-05-11: 10 mL via INTRA_ARTERIAL

## 2016-05-11 MED ORDER — HEPARIN (PORCINE) IN NACL 2-0.9 UNIT/ML-% IJ SOLN
INTRAMUSCULAR | Status: AC
  Filled 2016-05-11: qty 1500

## 2016-05-11 MED ORDER — FENTANYL CITRATE (PF) 100 MCG/2ML IJ SOLN
INTRAMUSCULAR | Status: AC
  Filled 2016-05-11: qty 2

## 2016-05-11 MED ORDER — ASPIRIN 81 MG PO CHEW: 81.0000 mg | CHEWABLE_TABLET | Freq: Every day | ORAL | Status: DC

## 2016-05-11 MED ORDER — METFORMIN HCL 500 MG PO TABS: 500.0000 mg | ORAL_TABLET | Freq: Two times a day (BID) | ORAL | Status: DC

## 2016-05-11 MED ORDER — ASPIRIN 81 MG PO CHEW
CHEWABLE_TABLET | ORAL | Status: AC
  Filled 2016-05-11: qty 1

## 2016-05-11 MED ORDER — MIDAZOLAM HCL 2 MG/2ML IJ SOLN
INTRAMUSCULAR | Status: DC | PRN
  Administered 2016-05-11: 2 mg via INTRAVENOUS
  Administered 2016-05-11: 1 mg via INTRAVENOUS

## 2016-05-11 MED ORDER — CLOPIDOGREL BISULFATE 75 MG PO TABS
300.0000 mg | ORAL_TABLET | Freq: Once | ORAL | Status: AC
  Administered 2016-05-11: 300 mg via ORAL

## 2016-05-11 MED ORDER — IOPAMIDOL (ISOVUE-370) INJECTION 76%
INTRAVENOUS | Status: AC
  Filled 2016-05-11: qty 125

## 2016-05-11 MED ORDER — LIDOCAINE HCL (PF) 1 % IJ SOLN
INTRAMUSCULAR | Status: AC
  Filled 2016-05-11: qty 30

## 2016-05-11 MED ORDER — FENTANYL CITRATE (PF) 100 MCG/2ML IJ SOLN
INTRAMUSCULAR | Status: DC | PRN
  Administered 2016-05-11 (×2): 25 ug via INTRAVENOUS

## 2016-05-11 MED ORDER — CLOPIDOGREL BISULFATE 75 MG PO TABS: 75.0000 mg | ORAL_TABLET | Freq: Every day | ORAL | Status: DC

## 2016-05-11 SURGICAL SUPPLY — 10 items
CATH INFINITI 5FR AL1 (CATHETERS) ×2 IMPLANT
CATH INFINITI 5FR MULTPACK ANG (CATHETERS) ×2 IMPLANT
DEVICE RAD COMP TR BAND LRG (VASCULAR PRODUCTS) ×2 IMPLANT
GLIDESHEATH SLEND SS 6F .021 (SHEATH) ×2 IMPLANT
KIT HEART LEFT (KITS) ×2 IMPLANT
PACK CARDIAC CATHETERIZATION (CUSTOM PROCEDURE TRAY) ×2 IMPLANT
SYR MEDRAD MARK V 150ML (SYRINGE) ×2 IMPLANT
TRANSDUCER W/STOPCOCK (MISCELLANEOUS) ×2 IMPLANT
TUBING CIL FLEX 10 FLL-RA (TUBING) ×2 IMPLANT
WIRE SAFE-T 1.5MM-J .035X260CM (WIRE) ×2 IMPLANT

## 2016-05-11 NOTE — Interval H&P Note (Signed)
Cath Lab Visit (complete for each Cath Lab visit)  Clinical Evaluation Leading to the Procedure:   ACS: No.  Non-ACS:    Anginal Classification: CCS II  Anti-ischemic medical therapy: Minimal Therapy (1 class of medications)  Non-Invasive Test Results: Intermediate-risk stress test findings: cardiac mortality 1-3%/year  Prior CABG: Previous CABG      History and Physical Interval Note:  05/11/2016 9:22 AM  Miguel Vasquez  has presented today for surgery, with the diagnosis of Abnormal Stress Test  The various methods of treatment have been discussed with the patient and family. After consideration of risks, benefits and other options for treatment, the patient has consented to  Procedure(s): Left Heart Cath and Cors/Grafts Angiography (N/A) as a surgical intervention .  The patient's history has been reviewed, patient examined, no change in status, stable for surgery.  I have reviewed the patient's chart and labs.  Questions were answered to the patient's satisfaction.     Larae Grooms

## 2016-05-11 NOTE — Discharge Instructions (Signed)
DO NOT TAKE METFORMIN/GLUCOPHAGE FOR 2 DAYS   Radial Site Care Refer to this sheet in the next few weeks. These instructions provide you with information about caring for yourself after your procedure. Your health care provider may also give you more specific instructions. Your treatment has been planned according to current medical practices, but problems sometimes occur. Call your health care provider if you have any problems or questions after your procedure. WHAT TO EXPECT AFTER THE PROCEDURE After your procedure, it is typical to have the following:  Bruising at the radial site that usually fades within 1-2 weeks.  Blood collecting in the tissue (hematoma) that may be painful to the touch. It should usually decrease in size and tenderness within 1-2 weeks. HOME CARE INSTRUCTIONS  Take medicines only as directed by your health care provider.  You may shower 24-48 hours after the procedure or as directed by your health care provider. Remove the bandage (dressing) and gently wash the site with plain soap and water. Pat the area dry with a clean towel. Do not rub the site, because this may cause bleeding.  Do not take baths, swim, or use a hot tub until your health care provider approves.  Check your insertion site every day for redness, swelling, or drainage.  Do not apply powder or lotion to the site.  Do not flex or bend the affected arm for 24 hours or as directed by your health care provider.  Do not push or pull heavy objects with the affected arm for 24 hours or as directed by your health care provider.  Do not lift over 10 lb (4.5 kg) for 5 days after your procedure or as directed by your health care provider.  Ask your health care provider when it is okay to:  Return to work or school.  Resume usual physical activities or sports.  Resume sexual activity.  Do not drive home if you are discharged the same day as the procedure. Have someone else drive you.  You may drive  24 hours after the procedure unless otherwise instructed by your health care provider.  Do not operate machinery or power tools for 24 hours after the procedure.  If your procedure was done as an outpatient procedure, which means that you went home the same day as your procedure, a responsible adult should be with you for the first 24 hours after you arrive home.  Keep all follow-up visits as directed by your health care provider. This is important. SEEK MEDICAL CARE IF:  You have a fever.  You have chills.  You have increased bleeding from the radial site. Hold pressure on the site. SEEK IMMEDIATE MEDICAL CARE IF:  You have unusual pain at the radial site.  You have redness, warmth, or swelling at the radial site.  You have drainage (other than a small amount of blood on the dressing) from the radial site.  The radial site is bleeding, and the bleeding does not stop after 30 minutes of holding steady pressure on the site.  Your arm or hand becomes pale, cool, tingly, or numb.   This information is not intended to replace advice given to you by your health care provider. Make sure you discuss any questions you have with your health care provider.   Document Released: 11/28/2010 Document Revised: 11/16/2014 Document Reviewed: 05/14/2014 Elsevier Interactive Patient Education [employer].

## 2016-05-11 NOTE — H&P (View-Only) (Signed)
Cardiology Office Note    Date:  05/01/2016  ID:  Miguel Vasquez, DOB [birth year], MRN [id number] PCP:  Coy Saunas, MD  Cardiologist:  Dr. Martinique   Chief Complaint: chest pain  History of Present Illness:  Miguel Vasquez is a 70 y.o. male with history of CAD (stent to Cx 1996, stent to RCA 2002, s/p CABGx3 in 2015), HLD (intolerant of statins due to elevated liver enzymes), HTN, DM, hemochromatosis, cervical disc disease, panic disorder, hypothyroidism, h/o marked sinus bradycardia (BB stopped) who presents for evaluation of chest pain. Per chart his last ischemic eval was the nuc that prompted cath prior to CABG in 2015. He was seen in lipid clinic 10/2015 to consider PCSK9 inhibitor but this was postponed since he was changing insurance. Last 2D Echo 09/2014: mild-mod LVH, EF 55-60%, cannot exclude RWMA, mild AI.  He presents for evaluation of chest discomfort. This has been occurring in his upper throat about twice a month lasting less than a minute at a time. It occurs at random and resolves spontaneously. He has not had any lower chest pain, SOB, nausea, vomiting or syncope. He has been able to play golf without provoking any discomfort. He has noticed by the 14th hole he feels fatigued. Symptoms are grossly atypical but he raises concern because the throat discomfort reminds him of what prompted the stress test that lead to the CABG.  Past Medical History  Diagnosis Date  . Hypercholesterolemia   . Coronary disease     Status post stenting of the left circumflex coronary in 1996--status post stenting of the right coronary in 2002, CABG 09/2014  . Hypertension     Controlled  . Diabetes mellitus     Type 2  . Hemochromatosis   . Obesity   . History of ulcer disease   . Panic disorder     Hx of panic disorder  . Cervical disc disease     History of Cervical Disc Disease  . Myocardial infarction ([place]) 2002  . Cataracts, bilateral   . Basal cell carcinoma, face   . S/P  tonsillectomy   . Trigger finger   . Hypothyroid   . Sinus bradycardia     Past Surgical History  Procedure Laterality Date  . Coronary stent placement      x3  . Coronary artery bypass graft N/A 10/03/2014    Procedure: CORONARY ARTERY BYPASS GRAFTING (CABG) x  three, using left internal mammary artery and right leg greater saphenous vein harvested endoscopically;  Surgeon: Melrose Nakayama, MD;  Location: [facility];  Service: Open Heart Surgery;  Laterality: N/A;  . Tee without cardioversion N/A 10/03/2014    Procedure: TRANSESOPHAGEAL ECHOCARDIOGRAM (TEE);  Surgeon: Melrose Nakayama, MD;  Location: [facility];  Service: Open Heart Surgery;  Laterality: N/A;  . Cardiac catheterization    . Left heart catheterization with coronary angiogram N/A 09/28/2014    Procedure: LEFT HEART CATHETERIZATION WITH CORONARY ANGIOGRAM;  Surgeon: Troy Sine, MD;  Location: [facility] CATH LAB;  Service: Cardiovascular;  Laterality: N/A;    Current Medications: Current Outpatient Prescriptions  Medication Sig Dispense Refill  . acetaminophen (TYLENOL) 500 MG tablet Take 500 mg by mouth every 8 (eight) hours as needed for moderate pain.    [name] allopurinol (ZYLOPRIM) 300 MG tablet Take 150 mg by mouth daily.     [name] aspirin 81 MG tablet Take 81 mg by mouth daily.    [name] ezetimibe (ZETIA) 10 MG tablet Take 10 mg by mouth  daily.      [name] FOLIC ACID PO Take [id number] mg by mouth daily.     [name] levothyroxine (SYNTHROID, LEVOTHROID) 100 MCG tablet Take 100 mcg by mouth daily.      . metFORMIN (GLUCOPHAGE) 500 MG tablet Take 500 mg by mouth 2 (two) times daily with a meal.     . nitroGLYCERIN (NITROSTAT) 0.4 MG SL tablet Place 0.4 mg under the tongue every 5 (five) minutes as needed for chest pain. DISSOLVE ONE TABLET UNDER THE TONGUE EVERY 5 MINUTES AS NEEDED FOR CHEST PAIN.  DO NOT EXCEED A TOTAL OF 3 DOSES IN 15 MINUTES    . omega-3 acid ethyl esters (LOVAZA) 1 g capsule Take 2 capsules (2 g total) by mouth 2 (two) times daily.  120 capsule 6  . pantoprazole (PROTONIX) 40 MG tablet Take 1 tablet (40 mg total) by mouth daily. 30 tablet 11  . PARoxetine (PAXIL) 20 MG tablet Take 20 mg by mouth daily.      . ramipril (ALTACE) 10 MG capsule Take 10 mg by mouth daily.     No current facility-administered medications for this visit.     Allergies:   Statins   Social History   Social History  . Marital Status: Married    Spouse Name: N/A  . Number of Children: 1  . Years of Education: N/A   Occupational History  . inspector [employer]    Social History Main Topics  . Smoking status: Former Smoker    Start date: 09/20/1995  . Smokeless tobacco: None  . Alcohol Use: Yes  . Drug Use: None  . Sexual Activity: Not Asked   Other Topics Concern  . None   Social History Narrative     Family History:  The patient's family history includes Cancer in his brother and mother; Heart attack in his sister; Heart failure in his mother.   ROS:   Please see the history of present illness.  All other systems are reviewed and otherwise negative.    PHYSICAL EXAM:   VS:  BP 140/86 mmHg  Pulse 68  Ht 6\' 2"  (1.88 m)  Wt 220 lb 9.6 oz (100.064 kg)  BMI 28.31 kg/m2  BMI: Body mass index is 28.31 kg/(m^2). GEN: Well nourished, well developed WM, in no acute distress HEENT: normocephalic, atraumatic Neck: no JVD, carotid bruits, or masses Cardiac: RRR; soft SEM at RUSB, preserved S2. No rubs or gallops, no edema  Respiratory:  clear to auscultation bilaterally, normal work of breathing GI: soft, nontender, nondistended, + BS MS: no deformity or atrophy Skin: warm and dry, no rash Neuro:  Alert and Oriented x 3, Strength and sensation are intact, follows commands Psych: euthymic mood, full affect  Wt Readings from Last 3 Encounters:  05/01/16 220 lb 9.6 oz (100.064 kg)  12/27/15 224 lb 8 oz (101.833 kg)  10/23/15 228 lb (103.42 kg)      Studies/Labs Reviewed:   EKG:  EKG was ordered today and personally reviewed by  me and demonstrates NSR 68bpm, nonspecific T wave changes III, V2.  Additional studies/ records that were reviewed today include: Summarized above.    ASSESSMENT & PLAN:   1. Chest pain - atypical symptoms. However, the patient is concerned given that this reminds him somewhat of what he felt before CABG. Will proceed with exercise nuclear stress testing to further evaluate. Warning sx reviewed with patient. Check BMET, CBC to ensure no obvious metabolic abnormalities when he returns for fasting lipids/liver. ADDENDUM  05/08/16 - nuc abnormal. See result note. Plan LHC. Also, Of note (already discussed with patient), page 10 of the EKG strips from his stress test also show an abrupt end to a narrow-complex tachycardia. I do not have any strips from the beginning of this event. The morphology raises question of atrial flutter. The patient has not had any palpitations. Reviewed with Dr. Martinique - hold off on anticoagulation for now as this may have been related to ischemia in the setting of exercise. We can consider event monitoring in follow-up. 2. Systolic murmur - no prior documentation of such. Update echo. 3. CAD - see above. Continue ASA, Zetia. Not on BB due to h/o marked sinus bradycardia. 4. Hypertension - BP modestly elevated. Will f/u BP from exercise test and echo findings and consider med titration based on these studies. 5. Hyperlipidemia - the patient is interested in PCSK-9 medication and is now finished switching insurance. Our nurse spoke with pharmD at [facility] who requested updated liver/fasting lipid panel. Will draw when he returns fasting in the next several days. He remains on Zetia at this time.   Disposition: F/u with Dr. Martinique or APP in 1 month.  Medication Adjustments/Labs and Tests Ordered: Current medicines are reviewed at length with the patient today.  Concerns regarding medicines are outlined above. Medication changes, Labs and Tests ordered today are summarized above  and listed in the Patient Instructions accessible in Encounters.   Raechel Ache PA-C  05/01/2016 1:52 PM    [facility] [address], [place], [place]  [address] Phone: [phone]; Fax: [phone]

## 2016-05-13 ENCOUNTER — Other Ambulatory Visit: Payer: Self-pay

## 2016-05-19 ENCOUNTER — Encounter (HOSPITAL_COMMUNITY): Payer: Self-pay | Admitting: Cardiology

## 2016-05-19 ENCOUNTER — Ambulatory Visit (HOSPITAL_COMMUNITY)
Admission: RE | Admit: 2016-05-19 | Discharge: 2016-05-20 | Disposition: A | Discharge status: Home / Self Care | Payer: Medicare Other | Source: Ambulatory Visit | Attending: Cardiology | Admitting: Cardiology

## 2016-05-19 ENCOUNTER — Encounter (HOSPITAL_COMMUNITY)
Admission: RE | Disposition: A | Discharge status: Home / Self Care | Payer: Self-pay | Source: Ambulatory Visit | Attending: Cardiology

## 2016-05-19 DIAGNOSIS — Z955 Presence of coronary angioplasty implant and graft: Secondary | ICD-10-CM

## 2016-05-19 DIAGNOSIS — F410 Panic disorder [episodic paroxysmal anxiety] without agoraphobia: Secondary | ICD-10-CM | POA: Insufficient documentation

## 2016-05-19 DIAGNOSIS — I25810 Atherosclerosis of coronary artery bypass graft(s) without angina pectoris: Secondary | ICD-10-CM | POA: Diagnosis present

## 2016-05-19 DIAGNOSIS — E039 Hypothyroidism, unspecified: Secondary | ICD-10-CM | POA: Insufficient documentation

## 2016-05-19 DIAGNOSIS — Z7984 Long term (current) use of oral hypoglycemic drugs: Secondary | ICD-10-CM | POA: Insufficient documentation

## 2016-05-19 DIAGNOSIS — Z951 Presence of aortocoronary bypass graft: Secondary | ICD-10-CM

## 2016-05-19 DIAGNOSIS — E7800 Pure hypercholesterolemia, unspecified: Secondary | ICD-10-CM | POA: Insufficient documentation

## 2016-05-19 DIAGNOSIS — Z7982 Long term (current) use of aspirin: Secondary | ICD-10-CM | POA: Insufficient documentation

## 2016-05-19 DIAGNOSIS — Z87891 Personal history of nicotine dependence: Secondary | ICD-10-CM | POA: Diagnosis not present

## 2016-05-19 DIAGNOSIS — I2584 Coronary atherosclerosis due to calcified coronary lesion: Secondary | ICD-10-CM | POA: Diagnosis not present

## 2016-05-19 DIAGNOSIS — E669 Obesity, unspecified: Secondary | ICD-10-CM | POA: Insufficient documentation

## 2016-05-19 DIAGNOSIS — I209 Angina pectoris, unspecified: Secondary | ICD-10-CM

## 2016-05-19 DIAGNOSIS — I25710 Atherosclerosis of autologous vein coronary artery bypass graft(s) with unstable angina pectoris: Secondary | ICD-10-CM | POA: Diagnosis not present

## 2016-05-19 DIAGNOSIS — I2510 Atherosclerotic heart disease of native coronary artery without angina pectoris: Secondary | ICD-10-CM | POA: Diagnosis not present

## 2016-05-19 DIAGNOSIS — I2582 Chronic total occlusion of coronary artery: Secondary | ICD-10-CM | POA: Insufficient documentation

## 2016-05-19 DIAGNOSIS — R011 Cardiac murmur, unspecified: Secondary | ICD-10-CM | POA: Diagnosis not present

## 2016-05-19 DIAGNOSIS — M6530 Trigger finger, unspecified finger: Secondary | ICD-10-CM | POA: Insufficient documentation

## 2016-05-19 DIAGNOSIS — E785 Hyperlipidemia, unspecified: Secondary | ICD-10-CM | POA: Diagnosis not present

## 2016-05-19 DIAGNOSIS — M5090 Cervical disc disorder, unspecified, unspecified cervical region: Secondary | ICD-10-CM | POA: Insufficient documentation

## 2016-05-19 DIAGNOSIS — I10 Essential (primary) hypertension: Secondary | ICD-10-CM | POA: Insufficient documentation

## 2016-05-19 DIAGNOSIS — Z6828 Body mass index (BMI) 28.0-28.9, adult: Secondary | ICD-10-CM | POA: Diagnosis not present

## 2016-05-19 DIAGNOSIS — I25119 Atherosclerotic heart disease of native coronary artery with unspecified angina pectoris: Secondary | ICD-10-CM | POA: Diagnosis present

## 2016-05-19 DIAGNOSIS — R001 Bradycardia, unspecified: Secondary | ICD-10-CM | POA: Insufficient documentation

## 2016-05-19 DIAGNOSIS — I252 Old myocardial infarction: Secondary | ICD-10-CM | POA: Diagnosis not present

## 2016-05-19 DIAGNOSIS — Z85828 Personal history of other malignant neoplasm of skin: Secondary | ICD-10-CM | POA: Insufficient documentation

## 2016-05-19 DIAGNOSIS — R9439 Abnormal result of other cardiovascular function study: Secondary | ICD-10-CM | POA: Diagnosis present

## 2016-05-19 DIAGNOSIS — E119 Type 2 diabetes mellitus without complications: Secondary | ICD-10-CM | POA: Insufficient documentation

## 2016-05-19 HISTORY — DX: Other specified bacterial intestinal infections: A04.8

## 2016-05-19 HISTORY — DX: Personal history of other diseases of the musculoskeletal system and connective tissue: Z87.39

## 2016-05-19 HISTORY — PX: CARDIAC CATHETERIZATION: SHX172

## 2016-05-19 HISTORY — DX: Gastro-esophageal reflux disease without esophagitis: K21.9

## 2016-05-19 HISTORY — DX: Type 2 diabetes mellitus without complications: E11.9

## 2016-05-19 HISTORY — DX: Presence of coronary angioplasty implant and graft: Z95.5

## 2016-05-19 LAB — BASIC METABOLIC PANEL
Anion gap: 11 (ref 5–15)
BUN: 11 mg/dL (ref 6–20)
CHLORIDE: 101 mmol/L (ref 101–111)
CO2: 24 mmol/L (ref 22–32)
CREATININE: 0.81 mg/dL (ref 0.61–1.24)
Calcium: 9.8 mg/dL (ref 8.9–10.3)
GFR calc non Af Amer: >60 mL/min (ref >60)
Glucose, Bld: 159 mg/dL — ABNORMAL HIGH (ref 65–99)
POTASSIUM: 4 mmol/L (ref 3.5–5.1)
Sodium: 136 mmol/L (ref 135–145)

## 2016-05-19 LAB — POCT ACTIVATED CLOTTING TIME
ACTIVATED CLOTTING TIME: 412 s
ACTIVATED CLOTTING TIME: 120 s
Activated Clotting Time: 257 seconds
Activated Clotting Time: 422 seconds

## 2016-05-19 LAB — GLUCOSE, CAPILLARY
GLUCOSE-CAPILLARY: 200 mg/dL — AB (ref 65–99)
GLUCOSE-CAPILLARY: 155 mg/dL — AB (ref 65–99)
Glucose-Capillary: 165 mg/dL — ABNORMAL HIGH (ref 65–99)

## 2016-05-19 SURGERY — CORONARY ATHERECTOMY
Anesthesia: LOCAL

## 2016-05-19 MED ORDER — METFORMIN HCL 500 MG PO TABS: 500.0000 mg | ORAL_TABLET | Freq: Two times a day (BID) | ORAL | Status: DC

## 2016-05-19 MED ORDER — LIDOCAINE HCL (PF) 1 % IJ SOLN
INTRAMUSCULAR | Status: DC | PRN
  Administered 2016-05-19: 11 mL via INTRADERMAL

## 2016-05-19 MED ORDER — VERAPAMIL HCL 2.5 MG/ML IV SOLN
INTRAVENOUS | Status: AC
  Filled 2016-05-19: qty 2

## 2016-05-19 MED ORDER — IOPAMIDOL (ISOVUE-370) INJECTION 76%
INTRAVENOUS | Status: DC | PRN
  Administered 2016-05-19: 150 mL via INTRA_ARTERIAL

## 2016-05-19 MED ORDER — HEPARIN (PORCINE) IN NACL 2-0.9 UNIT/ML-% IJ SOLN
INTRAMUSCULAR | Status: AC
  Filled 2016-05-19: qty 1500

## 2016-05-19 MED ORDER — NITROGLYCERIN IN D5W 200-5 MCG/ML-% IV SOLN
INTRAVENOUS | Status: AC
  Filled 2016-05-19: qty 250

## 2016-05-19 MED ORDER — VIPERSLIDE LUBRICANT OPTIME
TOPICAL | Status: DC | PRN
  Administered 2016-05-19: 10:00:00 via SURGICAL_CAVITY

## 2016-05-19 MED ORDER — HEPARIN (PORCINE) IN NACL 2-0.9 UNIT/ML-% IJ SOLN: INTRAMUSCULAR | Status: DC | PRN

## 2016-05-19 MED ORDER — MIDAZOLAM HCL 2 MG/2ML IJ SOLN
INTRAMUSCULAR | Status: DC | PRN
  Administered 2016-05-19: 1 mg via INTRAVENOUS
  Administered 2016-05-19: 2 mg via INTRAVENOUS

## 2016-05-19 MED ORDER — NITROGLYCERIN 0.4 MG SL SUBL: 0.4000 mg | SUBLINGUAL_TABLET | SUBLINGUAL | Status: DC | PRN

## 2016-05-19 MED ORDER — FENTANYL CITRATE (PF) 100 MCG/2ML IJ SOLN
INTRAMUSCULAR | Status: DC | PRN
  Administered 2016-05-19: 25 ug via INTRAVENOUS
  Administered 2016-05-19: 50 ug via INTRAVENOUS

## 2016-05-19 MED ORDER — OMEGA-3-ACID ETHYL ESTERS 1 G PO CAPS
2.0000 g | ORAL_CAPSULE | Freq: Two times a day (BID) | ORAL | Status: DC
  Administered 2016-05-20: 2 g via ORAL
  Filled 2016-05-19 (×3): qty 2

## 2016-05-19 MED ORDER — HEPARIN SODIUM (PORCINE) 1000 UNIT/ML IJ SOLN
INTRAMUSCULAR | Status: AC
  Filled 2016-05-19: qty 1

## 2016-05-19 MED ORDER — SODIUM CHLORIDE 0.9 % WEIGHT BASED INFUSION
3.0000 mL/kg/h | INTRAVENOUS | Status: DC
  Administered 2016-05-19: 3 mL/kg/h via INTRAVENOUS

## 2016-05-19 MED ORDER — SODIUM CHLORIDE 0.9 % IV SOLN: 250.0000 mL | INTRAVENOUS | Status: DC | PRN

## 2016-05-19 MED ORDER — ALLOPURINOL 300 MG PO TABS
150.0000 mg | ORAL_TABLET | Freq: Every day | ORAL | Status: DC
  Administered 2016-05-20: 10:00:00 150 mg via ORAL
  Filled 2016-05-19: qty 1

## 2016-05-19 MED ORDER — SODIUM CHLORIDE 0.9% FLUSH: 3.0000 mL | Freq: Two times a day (BID) | INTRAVENOUS | Status: DC

## 2016-05-19 MED ORDER — ASPIRIN EC 81 MG PO TBEC
81.0000 mg | DELAYED_RELEASE_TABLET | Freq: Every day | ORAL | Status: DC
  Administered 2016-05-20: 81 mg via ORAL
  Filled 2016-05-19: qty 1

## 2016-05-19 MED ORDER — PANTOPRAZOLE SODIUM 40 MG PO TBEC
40.0000 mg | DELAYED_RELEASE_TABLET | Freq: Every day | ORAL | Status: DC
  Administered 2016-05-19 – 2016-05-20 (×2): 40 mg via ORAL
  Filled 2016-05-19 (×2): qty 1

## 2016-05-19 MED ORDER — SODIUM CHLORIDE 0.9 % WEIGHT BASED INFUSION: 1.0000 mL/kg/h | INTRAVENOUS | Status: DC

## 2016-05-19 MED ORDER — ZOLPIDEM TARTRATE 5 MG PO TABS: 5.0000 mg | ORAL_TABLET | Freq: Every evening | ORAL | Status: DC | PRN

## 2016-05-19 MED ORDER — ACETAMINOPHEN 325 MG PO TABS: 650.0000 mg | ORAL_TABLET | ORAL | Status: DC | PRN

## 2016-05-19 MED ORDER — SODIUM CHLORIDE 0.9 % WEIGHT BASED INFUSION
3.0000 mL/kg/h | INTRAVENOUS | Status: AC
  Administered 2016-05-19: 3 mL/kg/h via INTRAVENOUS

## 2016-05-19 MED ORDER — ONDANSETRON HCL 4 MG/2ML IJ SOLN: 4.0000 mg | Freq: Four times a day (QID) | INTRAMUSCULAR | Status: DC | PRN

## 2016-05-19 MED ORDER — EZETIMIBE 10 MG PO TABS
10.0000 mg | ORAL_TABLET | Freq: Every day | ORAL | Status: DC
  Administered 2016-05-20: 10 mg via ORAL
  Filled 2016-05-19: qty 1

## 2016-05-19 MED ORDER — RAMIPRIL 10 MG PO CAPS
10.0000 mg | ORAL_CAPSULE | Freq: Every day | ORAL | Status: DC
Start: 2016-05-20 — End: 2016-05-20
  Administered 2016-05-20: 10:00:00 10 mg via ORAL
  Filled 2016-05-19: qty 1

## 2016-05-19 MED ORDER — FOLIC ACID 1 MG PO TABS
0.5000 mg | ORAL_TABLET | Freq: Every day | ORAL | Status: DC
  Administered 2016-05-20: 0.5 mg via ORAL
  Filled 2016-05-19: qty 1

## 2016-05-19 MED ORDER — LIDOCAINE HCL (PF) 1 % IJ SOLN
INTRAMUSCULAR | Status: AC
  Filled 2016-05-19: qty 30

## 2016-05-19 MED ORDER — MIDAZOLAM HCL 2 MG/2ML IJ SOLN
INTRAMUSCULAR | Status: AC
  Filled 2016-05-19: qty 2

## 2016-05-19 MED ORDER — NITROGLYCERIN 1 MG/10 ML FOR IR/CATH LAB
INTRA_ARTERIAL | Status: DC | PRN
  Administered 2016-05-19: 09:00:00

## 2016-05-19 MED ORDER — CLOPIDOGREL BISULFATE 75 MG PO TABS: 75.0000 mg | ORAL_TABLET | ORAL | Status: DC

## 2016-05-19 MED ORDER — ASPIRIN 81 MG PO CHEW
81.0000 mg | CHEWABLE_TABLET | ORAL | Status: AC
  Administered 2016-05-19: 81 mg via ORAL

## 2016-05-19 MED ORDER — FENTANYL CITRATE (PF) 100 MCG/2ML IJ SOLN
INTRAMUSCULAR | Status: AC
  Filled 2016-05-19: qty 2

## 2016-05-19 MED ORDER — SODIUM CHLORIDE 0.9% FLUSH: 3.0000 mL | INTRAVENOUS | Status: DC | PRN

## 2016-05-19 MED ORDER — CLOPIDOGREL BISULFATE 75 MG PO TABS
75.0000 mg | ORAL_TABLET | Freq: Every day | ORAL | Status: DC
  Administered 2016-05-20: 75 mg via ORAL
  Filled 2016-05-19: qty 1

## 2016-05-19 MED ORDER — IOPAMIDOL (ISOVUE-370) INJECTION 76%
INTRAVENOUS | Status: AC
  Filled 2016-05-19: qty 50

## 2016-05-19 MED ORDER — NITROGLYCERIN 1 MG/10 ML FOR IR/CATH LAB
INTRA_ARTERIAL | Status: DC | PRN
  Administered 2016-05-19 (×2): 200 ug via INTRACORONARY

## 2016-05-19 MED ORDER — ALPRAZOLAM 0.25 MG PO TABS
0.2500 mg | ORAL_TABLET | Freq: Three times a day (TID) | ORAL | Status: DC | PRN
  Administered 2016-05-19: 0.25 mg via ORAL
  Filled 2016-05-19: qty 1

## 2016-05-19 MED ORDER — LEVOTHYROXINE SODIUM 100 MCG PO TABS
100.0000 ug | ORAL_TABLET | Freq: Every day | ORAL | Status: DC
  Administered 2016-05-20: 07:00:00 100 ug via ORAL
  Filled 2016-05-19: qty 1

## 2016-05-19 MED ORDER — ASPIRIN 81 MG PO CHEW
CHEWABLE_TABLET | ORAL | Status: AC
Start: 2016-05-19 — End: 2016-05-19
  Filled 2016-05-19: qty 1

## 2016-05-19 MED ORDER — HEPARIN (PORCINE) IN NACL 2-0.9 UNIT/ML-% IJ SOLN
INTRAMUSCULAR | Status: AC
  Filled 2016-05-19: qty 500

## 2016-05-19 MED ORDER — SODIUM CHLORIDE 0.9% FLUSH
3.0000 mL | Freq: Two times a day (BID) | INTRAVENOUS | Status: DC
  Administered 2016-05-19: 22:00:00 3 mL via INTRAVENOUS

## 2016-05-19 MED ORDER — FOLIC ACID 1 MG PO TABS: 400.0000 mg | ORAL_TABLET | Freq: Every day | ORAL | Status: DC

## 2016-05-19 MED ORDER — PAROXETINE HCL 20 MG PO TABS
20.0000 mg | ORAL_TABLET | Freq: Every day | ORAL | Status: DC
  Filled 2016-05-19 (×2): qty 1

## 2016-05-19 MED ORDER — HEPARIN SODIUM (PORCINE) 1000 UNIT/ML IJ SOLN
INTRAMUSCULAR | Status: DC | PRN
  Administered 2016-05-19: 2000 [IU] via INTRAVENOUS
  Administered 2016-05-19: 8000 [IU] via INTRAVENOUS

## 2016-05-19 MED ORDER — ACETAMINOPHEN 500 MG PO TABS: 500.0000 mg | ORAL_TABLET | Freq: Three times a day (TID) | ORAL | Status: DC | PRN

## 2016-05-19 SURGICAL SUPPLY — 33 items
ANGIOSEAL 8FR (Vascular Products) ×2 IMPLANT
BALLN EMERGE MR 3.0X20 (BALLOONS) ×2
BALLN MINITREK OTW 1.20X12 (BALLOONS) ×2
BALLN ~~LOC~~ EMERGE MR 3.5X15 (BALLOONS) ×2
BALLN ~~LOC~~ EMERGE MR 3.75X15 (BALLOONS) ×2
BALLOON EMERGE MR 3.0X20 (BALLOONS) ×1 IMPLANT
BALLOON MINITREK OTW 1.20X12 (BALLOONS) ×1 IMPLANT
BALLOON ~~LOC~~ EMERGE MR 3.5X15 (BALLOONS) ×1 IMPLANT
BALLOON ~~LOC~~ EMERGE MR 3.75X15 (BALLOONS) ×1 IMPLANT
BURR ROTALINK 1.75MM (BURR) ×2 IMPLANT
CATH MACH1 8F JR4 90CM (CATHETERS) ×4 IMPLANT
CATH ROTALINK PLUS 1.25MM (BURR) ×2 IMPLANT
CATH S G BIP PACING (SET/KITS/TRAYS/PACK) ×2 IMPLANT
DEVICE CLOSURE ANGIOSEAL 8FR (Vascular Products) ×1 IMPLANT
ELECT DEFIB PAD ADLT CADENCE (PAD) ×2 IMPLANT
KIT ENCORE 26 ADVANTAGE (KITS) ×2 IMPLANT
KIT HEART LEFT (KITS) ×2 IMPLANT
LUBRICANT ROTAGLIDE 20CC VIAL (MISCELLANEOUS) ×2 IMPLANT
PACK CARDIAC CATHETERIZATION (CUSTOM PROCEDURE TRAY) ×2 IMPLANT
SHEATH PINNACLE 6F 10CM (SHEATH) IMPLANT
SHEATH PINNACLE 7F 10CM (SHEATH) ×2 IMPLANT
SHEATH PINNACLE 8F 10CM (SHEATH) ×2 IMPLANT
STENT SYNERGY DES 3.5X20 (Permanent Stent) ×2 IMPLANT
STENT SYNERGY DES 3X38 (Permanent Stent) ×2 IMPLANT
TRANSDUCER W/STOPCOCK (MISCELLANEOUS) ×2 IMPLANT
TUBING ART PRESS 72  MALE/FEM (TUBING) ×1
TUBING ART PRESS 72 MALE/FEM (TUBING) ×1 IMPLANT
TUBING CIL FLEX 10 FLL-RA (TUBING) ×2 IMPLANT
VALVE GUARDIAN II ~~LOC~~ HEMO (MISCELLANEOUS) ×2 IMPLANT
WIRE ASAHI FIELDER XT 300CM (WIRE) ×2 IMPLANT
WIRE EMERALD 3MM-J .035X150CM (WIRE) ×2 IMPLANT
WIRE PT2 MS 300CM (WIRE) ×2 IMPLANT
WIRE ROTA FLOPPY .009X325CM (WIRE) ×2 IMPLANT

## 2016-05-19 NOTE — H&P (View-Only) (Signed)
Cardiology Office Note    Date:  05/01/2016  ID:  Miguel Vasquez, DOB 01-13-1946, MRN [id number] PCP:  Coy Saunas, MD  Cardiologist:  Dr. Martinique   Chief Complaint: chest pain  History of Present Illness:  Miguel Vasquez is a 70 y.o. male with history of CAD (stent to Cx 1996, stent to RCA 2002, s/p CABGx3 in 2015), HLD (intolerant of statins due to elevated liver enzymes), HTN, DM, hemochromatosis, cervical disc disease, panic disorder, hypothyroidism, h/o marked sinus bradycardia (BB stopped) who presents for evaluation of chest pain. Per chart his last ischemic eval was the nuc that prompted cath prior to CABG in 2015. He was seen in lipid clinic 10/2015 to consider PCSK9 inhibitor but this was postponed since he was changing insurance. Last 2D Echo 09/2014: mild-mod LVH, EF 55-60%, cannot exclude RWMA, mild AI.  He presents for evaluation of chest discomfort. This has been occurring in his upper throat about twice a month lasting less than a minute at a time. It occurs at random and resolves spontaneously. He has not had any lower chest pain, SOB, nausea, vomiting or syncope. He has been able to play golf without provoking any discomfort. He has noticed by the 14th hole he feels fatigued. Symptoms are grossly atypical but he raises concern because the throat discomfort reminds him of what prompted the stress test that lead to the CABG.  Past Medical History  Diagnosis Date  . Hypercholesterolemia   . Coronary disease     Status post stenting of the left circumflex coronary in 1996--status post stenting of the right coronary in 2002, CABG 09/2014  . Hypertension     Controlled  . Diabetes mellitus     Type 2  . Hemochromatosis   . Obesity   . History of ulcer disease   . Panic disorder     Hx of panic disorder  . Cervical disc disease     History of Cervical Disc Disease  . Myocardial infarction ([place]) 2002  . Cataracts, bilateral   . Basal cell carcinoma, face   . S/P  tonsillectomy   . Trigger finger   . Hypothyroid   . Sinus bradycardia     Past Surgical History  Procedure Laterality Date  . Coronary stent placement      x3  . Coronary artery bypass graft N/A 10/03/2014    Procedure: CORONARY ARTERY BYPASS GRAFTING (CABG) x  three, using left internal mammary artery and right leg greater saphenous vein harvested endoscopically;  Surgeon: Melrose Nakayama, MD;  Location: [place];  Service: Open Heart Surgery;  Laterality: N/A;  . Tee without cardioversion N/A 10/03/2014    Procedure: TRANSESOPHAGEAL ECHOCARDIOGRAM (TEE);  Surgeon: Melrose Nakayama, MD;  Location: [facility];  Service: Open Heart Surgery;  Laterality: N/A;  . Cardiac catheterization    . Left heart catheterization with coronary angiogram N/A 09/28/2014    Procedure: LEFT HEART CATHETERIZATION WITH CORONARY ANGIOGRAM;  Surgeon: Troy Sine, MD;  Location: [facility] CATH LAB;  Service: Cardiovascular;  Laterality: N/A;    Current Medications: Current Outpatient Prescriptions  Medication Sig Dispense Refill  . acetaminophen (TYLENOL) 500 MG tablet Take 500 mg by mouth every 8 (eight) hours as needed for moderate pain.    [name] allopurinol (ZYLOPRIM) 300 MG tablet Take 150 mg by mouth daily.     [name] aspirin 81 MG tablet Take 81 mg by mouth daily.    [name] ezetimibe (ZETIA) 10 MG tablet Take 10 mg by mouth  daily.      [name] FOLIC ACID PO Take [id number] mg by mouth daily.     [name] levothyroxine (SYNTHROID, LEVOTHROID) 100 MCG tablet Take 100 mcg by mouth daily.      . metFORMIN (GLUCOPHAGE) 500 MG tablet Take 500 mg by mouth 2 (two) times daily with a meal.     . nitroGLYCERIN (NITROSTAT) 0.4 MG SL tablet Place 0.4 mg under the tongue every 5 (five) minutes as needed for chest pain. DISSOLVE ONE TABLET UNDER THE TONGUE EVERY 5 MINUTES AS NEEDED FOR CHEST PAIN.  DO NOT EXCEED A TOTAL OF 3 DOSES IN 15 MINUTES    . omega-3 acid ethyl esters (LOVAZA) 1 g capsule Take 2 capsules (2 g total) by mouth 2 (two) times daily.  120 capsule 6  . pantoprazole (PROTONIX) 40 MG tablet Take 1 tablet (40 mg total) by mouth daily. 30 tablet 11  . PARoxetine (PAXIL) 20 MG tablet Take 20 mg by mouth daily.      . ramipril (ALTACE) 10 MG capsule Take 10 mg by mouth daily.     No current facility-administered medications for this visit.     Allergies:   Statins   Social History   Social History  . Marital Status: Married    Spouse Name: N/A  . Number of Children: 1  . Years of Education: N/A   Occupational History  . inspector [employer]    Social History Main Topics  . Smoking status: Former Smoker    Start date: 09/20/1995  . Smokeless tobacco: None  . Alcohol Use: Yes  . Drug Use: None  . Sexual Activity: Not Asked   Other Topics Concern  . None   Social History Narrative     Family History:  The patient's family history includes Cancer in his brother and mother; Heart attack in his sister; Heart failure in his mother.   ROS:   Please see the history of present illness.  All other systems are reviewed and otherwise negative.    PHYSICAL EXAM:   VS:  BP 140/86 mmHg  Pulse 68  Ht 6\' 2"  (1.88 m)  Wt 220 lb 9.6 oz (100.064 kg)  BMI 28.31 kg/m2  BMI: Body mass index is 28.31 kg/(m^2). GEN: Well nourished, well developed WM, in no acute distress HEENT: normocephalic, atraumatic Neck: no JVD, carotid bruits, or masses Cardiac: RRR; soft SEM at RUSB, preserved S2. No rubs or gallops, no edema  Respiratory:  clear to auscultation bilaterally, normal work of breathing GI: soft, nontender, nondistended, + BS MS: no deformity or atrophy Skin: warm and dry, no rash Neuro:  Alert and Oriented x 3, Strength and sensation are intact, follows commands Psych: euthymic mood, full affect  Wt Readings from Last 3 Encounters:  05/01/16 220 lb 9.6 oz (100.064 kg)  12/27/15 224 lb 8 oz (101.833 kg)  10/23/15 228 lb (103.42 kg)      Studies/Labs Reviewed:   EKG:  EKG was ordered today and personally reviewed by  me and demonstrates NSR 68bpm, nonspecific T wave changes III, V2.  Additional studies/ records that were reviewed today include: Summarized above.    ASSESSMENT & PLAN:   1. Chest pain - atypical symptoms. However, the patient is concerned given that this reminds him somewhat of what he felt before CABG. Will proceed with exercise nuclear stress testing to further evaluate. Warning sx reviewed with patient. Check BMET, CBC to ensure no obvious metabolic abnormalities when he returns for fasting lipids/liver. ADDENDUM  05/08/16 - nuc abnormal. See result note. Plan LHC. Also, Of note (already discussed with patient), page 10 of the EKG strips from his stress test also show an abrupt end to a narrow-complex tachycardia. I do not have any strips from the beginning of this event. The morphology raises question of atrial flutter. The patient has not had any palpitations. Reviewed with Dr. Martinique - hold off on anticoagulation for now as this may have been related to ischemia in the setting of exercise. We can consider event monitoring in follow-up. 2. Systolic murmur - no prior documentation of such. Update echo. 3. CAD - see above. Continue ASA, Zetia. Not on BB due to h/o marked sinus bradycardia. 4. Hypertension - BP modestly elevated. Will f/u BP from exercise test and echo findings and consider med titration based on these studies. 5. Hyperlipidemia - the patient is interested in PCSK-9 medication and is now finished switching insurance. Our nurse spoke with pharmD at [facility] who requested updated liver/fasting lipid panel. Will draw when he returns fasting in the next several days. He remains on Zetia at this time.   Disposition: F/u with Dr. Martinique or APP in 1 month.  Medication Adjustments/Labs and Tests Ordered: Current medicines are reviewed at length with the patient today.  Concerns regarding medicines are outlined above. Medication changes, Labs and Tests ordered today are summarized above  and listed in the Patient Instructions accessible in Encounters.   Raechel Ache PA-C  05/01/2016 1:52 PM    [facility] [place] [place], [place], [place]  [address] Phone: [phone]; Fax: [phone]

## 2016-05-19 NOTE — Interval H&P Note (Signed)
History and Physical Interval Note:  05/19/2016 7:39 AM  Daryll Brod  has presented today for surgery, with the diagnosis of CAD  The various methods of treatment have been discussed with the patient and family. After consideration of risks, benefits and other options for treatment, the patient has consented to  Procedure(s): Coronary/Graft Atherectomy (N/A) as a surgical intervention .  The patient's history has been reviewed, patient examined, no change in status, stable for surgery.  I have reviewed the patient's chart and labs.  Questions were answered to the patient's satisfaction.   Cath Lab Visit (complete for each Cath Lab visit)  Clinical Evaluation Leading to the Procedure:   ACS: No.  Non-ACS:    Anginal Classification: CCS III  Anti-ischemic medical therapy: Minimal Therapy (1 class of medications)  Non-Invasive Test Results: Intermediate-risk stress test findings: cardiac mortality 1-3%/year  Prior CABG: Previous CABG        Collier Salina [facility] 05/19/2016 7:39 AM

## 2016-05-20 ENCOUNTER — Encounter (HOSPITAL_COMMUNITY): Payer: Self-pay | Admitting: Cardiology

## 2016-05-20 DIAGNOSIS — I25701 Atherosclerosis of coronary artery bypass graft(s), unspecified, with angina pectoris with documented spasm: Secondary | ICD-10-CM

## 2016-05-20 DIAGNOSIS — I2582 Chronic total occlusion of coronary artery: Secondary | ICD-10-CM | POA: Diagnosis not present

## 2016-05-20 DIAGNOSIS — R9439 Abnormal result of other cardiovascular function study: Secondary | ICD-10-CM

## 2016-05-20 DIAGNOSIS — Z955 Presence of coronary angioplasty implant and graft: Secondary | ICD-10-CM

## 2016-05-20 DIAGNOSIS — I209 Angina pectoris, unspecified: Secondary | ICD-10-CM

## 2016-05-20 DIAGNOSIS — I25710 Atherosclerosis of autologous vein coronary artery bypass graft(s) with unstable angina pectoris: Secondary | ICD-10-CM | POA: Diagnosis not present

## 2016-05-20 DIAGNOSIS — E7800 Pure hypercholesterolemia, unspecified: Secondary | ICD-10-CM | POA: Diagnosis not present

## 2016-05-20 DIAGNOSIS — E1159 Type 2 diabetes mellitus with other circulatory complications: Secondary | ICD-10-CM | POA: Diagnosis not present

## 2016-05-20 DIAGNOSIS — I25810 Atherosclerosis of coronary artery bypass graft(s) without angina pectoris: Secondary | ICD-10-CM | POA: Diagnosis present

## 2016-05-20 DIAGNOSIS — I2584 Coronary atherosclerosis due to calcified coronary lesion: Secondary | ICD-10-CM | POA: Diagnosis not present

## 2016-05-20 HISTORY — DX: Presence of coronary angioplasty implant and graft: Z95.5

## 2016-05-20 LAB — CBC
HEMATOCRIT: 44.2 % (ref 39.0–52.0)
HEMOGLOBIN: 14.7 g/dL (ref 13.0–17.0)
MCH: 30.5 pg (ref 26.0–34.0)
MCHC: 33.3 g/dL (ref 30.0–36.0)
MCV: 91.7 fL (ref 78.0–100.0)
Platelets: 116 10*3/uL — ABNORMAL LOW (ref 150–400)
RBC: 4.82 MIL/uL (ref 4.22–5.81)
RDW: 12.5 % (ref 11.5–15.5)
WBC: 4.7 10*3/uL (ref 4.0–10.5)

## 2016-05-20 LAB — BASIC METABOLIC PANEL
ANION GAP: 8 (ref 5–15)
BUN: 11 mg/dL (ref 6–20)
CALCIUM: 9.4 mg/dL (ref 8.9–10.3)
CO2: 27 mmol/L (ref 22–32)
Chloride: 105 mmol/L (ref 101–111)
Creatinine, Ser: 0.85 mg/dL (ref 0.61–1.24)
GFR calc Af Amer: >60 mL/min (ref >60)
Glucose, Bld: 164 mg/dL — ABNORMAL HIGH (ref 65–99)
POTASSIUM: 5 mmol/L (ref 3.5–5.1)
Sodium: 140 mmol/L (ref 135–145)

## 2016-05-20 LAB — GLUCOSE, CAPILLARY: GLUCOSE-CAPILLARY: 159 mg/dL — AB (ref 65–99)

## 2016-05-20 MED ORDER — METFORMIN HCL 500 MG PO TABS: 500.0000 mg | ORAL_TABLET | Freq: Two times a day (BID) | ORAL | Status: DC

## 2016-05-20 MED ORDER — ANGIOPLASTY BOOK
Freq: Once | Status: AC
  Administered 2016-05-20: 03:00:00
  Filled 2016-05-20: qty 1

## 2016-05-20 MED FILL — Verapamil HCl IV Soln 2.5 MG/ML: INTRAVENOUS | Qty: 4 | Status: AC

## 2016-05-20 MED FILL — Nitroglycerin IV Soln 200 MCG/ML in D5W: INTRAVENOUS | Qty: 250 | Status: AC

## 2016-05-20 NOTE — Progress Notes (Addendum)
Pt profile: 70 YOM with history of CAD (stent to Cx 1996, stent to RCA 2002, s/p CABGx3 in 2015), HLD (intolerant of statins due to elevated liver enzymes), HTN, DM, hemochromatosis, cervical disc disease, panic disorder, hypothyroidism, h/o marked sinus bradycardia (BB stopped) had developed chest pain. Nuc study was done  And + with Large inferolateral wall infarct and possible peri-infarct ischemia, EF 45%.  Echo with EF 60%, mild AR and G1DD.  Cath was done 05/11/16 with  Severe, diffuse calcific disease and a very large dominant RCA. Essentially, chronic occlusion at the ostium. There are brisk left to right collaterals. Occluded SVG to OM. Native disease in the circumflex does not appear severe.  Mid LAD lesion, 50% stenosed. Patent LIMA to LAD. LV function was normal.  Pt now back for CTO RCA.   Subjective: No complaints ambulated with cardiac rehab   Objective: Vital signs in last 24 hours: Temp:  [97.3 F (36.3 C)-98.5 F (36.9 C)] 98.5 F (36.9 C) (07/12 0818) Pulse Rate:  [61-87] 76 (07/12 0818) Resp:  [11-24] 19 (07/12 0818) BP: (126-174)/(72-101) 136/78 mmHg (07/12 0818) SpO2:  [93 %-100 %] 95 % (07/12 0818) Weight:  [216 lb 0.8 oz (98 kg)] 216 lb 0.8 oz (98 kg) (07/12 0535) Weight change: -3 lb 15.2 oz (-1.791 kg) Last BM Date: 05/19/16 Intake/Output from previous day: -1666 07/11 0701 - 07/12 0700 In: 1433.6 [P.O.:720; I.V.:713.6] Out: 3100 [Urine:3100] Intake/Output this shift:    PE: General:Pleasant affect, NAD Skin:Warm and dry, brisk capillary refill HEENT:normocephalic, sclera clear, mucus membranes moist Neck:supple, no JVD, no bruits  Heart:S1S2 RRR without murmur, gallup, rub or click Lungs:clear without rales, rhonchi, or wheezes [id number], non tender, + BS, do not palpate liver spleen or masses Ext:no lower ext edema, 2+ pedal pulses, 2+ radial pulses Neuro:alert and oriented, MAE, follows commands, + facial symmetry Tele: SR   EKG: SR no acute  changes.  Lab Results:  Recent Labs  05/20/16 0550  WBC 4.7  HGB 14.7  HCT 44.2  PLT 116*   BMET  Recent Labs  05/19/16 0604 05/20/16 0550  NA 136 140  K 4.0 5.0  CL 101 105  CO2 24 27  GLUCOSE 159* 164*  BUN 11 11  CREATININE 0.81 0.85  CALCIUM 9.8 9.4   No results for input(s): TROPONINI in the last 72 hours.  Invalid input(s): CK, MB  Lab Results  Component Value Date   CHOL 250* 05/07/2016   HDL 50 05/07/2016   LDLCALC 156* 05/07/2016   TRIG 218* 05/07/2016   CHOLHDL 5.0 05/07/2016   Lab Results  Component Value Date   HGBA1C 6.3* 10/02/2014     Studies/Results: Cardiac cath: and PCI:  05/19/16:  Conclusion     Mid RCA lesion, 80% stenosed.  Ost RCA to Prox RCA lesion, 99% stenosed. Post intervention, there is a 0% residual stenosis.  1. Successful rotational atherectomy and stenting of the ostial to mid RCA with DES x 2.   Plan: DAPT indefinitely. Anticipate DC in am.     Cardiac cath  05/11/16: Conclusion     Severe, diffuse calcific disease and a very large dominant RCA. Essentially, chronic occlusion at the ostium. There are brisk left to right collaterals.  Occluded SVG to OM. Native disease in the circumflex does not appear severe.  Mid LAD lesion, 50% stenosed. Patent LIMA to LAD.  The left ventricular systolic function is normal.  Normal LVEDP.  Medications: I have reviewed the patient's current medications. Scheduled Meds: . allopurinol  150 mg Oral Daily  . aspirin EC  81 mg Oral Daily  . clopidogrel  75 mg Oral Daily  . ezetimibe  10 mg Oral Daily  . folic acid  0.5 mg Oral Daily  . levothyroxine  100 mcg Oral QAC breakfast  . [START ON 05/22/2016] metFORMIN  500 mg Oral BID WC  . omega-3 acid ethyl esters  2 g Oral BID  . pantoprazole  40 mg Oral Daily  . PARoxetine  20 mg Oral Daily  . ramipril  10 mg Oral Daily  . sodium chloride flush  3 mL Intravenous Q12H   Continuous Infusions:  PRN Meds:.sodium  chloride, acetaminophen, ALPRAZolam, nitroGLYCERIN, ondansetron (ZOFRAN) IV, sodium chloride flush, zolpidem  Assessment/Plan: Principal Problem:   Abnormal stress test- with cath and CAD with cath 05/11/16  Severe, diffuse calcific disease and a very large dominant RCA. Essentially, chronic occlusion at the ostium. There are brisk left to right collaterals.  Occluded SVG to OM. Native disease in the circumflex does not appear severe  Active Problems:   S/P primary angioplasty with coronary stent and rotational atherectomy of ostial mRCA with DES X 2-- 7/11/7 -- on ASA and Plavix continue for 1 year --has follow up with Melina Copa in 2 weeks then to see Martinique.    Hypercholesterolemia- allergy to statins, on zetia last LDL 156 ? PCSK-9 will need lipid clinic     Coronary disease- see above    Hypertension- elevated this AM has not had meds, normally takes at 6A     Diabetes mellitus ([place]) hold metformin for 48 hours, , glucose here now 159.    S/P CABG x 3 with graft dysfunction.    Angina pectoris ([place])- resolved    CAD (coronary artery disease) of artery bypass graft, VG-OM occluded,       Time spent with pt. :  minutes. Cecilie Kicks  Nurse Practitioner Certified Pager [phone] or after 5pm and on weekends call [phone] 05/20/2016, 9:21 AM   The patient has been seen in conjunction with Cecilie Kicks, NP. All aspects of care have been considered and discussed. The patient has been personally interviewed, examined, and all clinical data has been reviewed.   History of previous coronary bypass grafting with saphenous vein graft failure. Underwent successful rotational atherectomy followed by stenting of the ostial to mid RCA by Dr. Martinique with a good angiographic result.  Right femoral shows no evidence of bleeding. Vital signs are stable. Laboratory data is unremarkable.  Plan discharge today. 2 week follow-up. Primary cardiologist is Martinique.

## 2016-05-20 NOTE — Discharge Summary (Signed)
Physician Discharge Summary       Patient ID: Miguel Vasquez MRN: [id number] DOB/AGE: 07-24-1946 70 y.o.  Admit date: 05/19/2016 Discharge date: 05/20/2016 Primary Cardiologist:Dr. Martinique   Discharge Diagnoses:  Principal Problem:   Abnormal stress test Active Problems:   S/P primary angioplasty with coronary stent and rotational atherectomy of ostial mRCA with DES X 2-- 7/11/7   Hypercholesterolemia   Coronary disease   Hypertension   Diabetes mellitus ([place])   S/P CABG x 3   Angina pectoris (HCC)   CAD (coronary artery disease) of artery bypass graft, VG-OM occluded,    Discharged Condition: good  Procedures: CTO with atherectomy and stent X 2 DES to Ostial to Prox RCA lesion.  By Dr. Martinique.   Procedures    Coronary Stent Intervention   Coronary/Graft Atherectomy    Conclusion     Mid RCA lesion, 80% stenosed.  Ost RCA to Prox RCA lesion, 99% stenosed. Post intervention, there is a 0% residual stenosis.  1. Successful rotational atherectomy and stenting of the ostial to mid RCA with DES x 2.   Plan: DAPT indefinitely. Anticipate DC in am.       Hospital Course:  70 yo WM with history of CAD s/p CABG. Recent acceleration of angina and abnormal nuc study. Cardiac cath 05/11/16 demonstrated patent LIMA to the LAD. SVGs to OM and RCA occluded. Native LCx was without significant disease. RCA had severe calcific disease at the ostium, proximal, and mid vessel he was arranged to return for rotational atherectomy and stenting to RCA.  He underwent this without complications receiving 2 stents-DES.  Today he is doing well without complications. Walked with cardiac rehab and BP mildly elevated but had not yet had po meds.  He was seen and evaluated by Dr. Tamala Julian and found stable for discharge.  He will hold metformin for 48 hours post cath.    Not on BB due to marked bradycardia on BB.  Is on ACE EF is normal.  Allergy to statin- he is on Zetia though LDL not controlled.   Plan for pharmacy referral for [facility] Will ask office to refer on follow up visit.          Consults: None  Significant Diagnostic Studies:  BMP Latest Ref Rng 05/20/2016 05/19/2016 05/07/2016  Glucose 65 - 99 mg/dL 164(H) 159(H) 150(H)  BUN 6 - 20 mg/dL 11 11 15   Creatinine 0.61 - 1.24 mg/dL 0.85 0.81 0.78  Sodium 135 - 145 mmol/L 140 136 138  Potassium 3.5 - 5.1 mmol/L 5.0 4.0 4.4  Chloride 101 - 111 mmol/L 105 101 100  CO2 22 - 32 mmol/L 27 24 25   Calcium 8.9 - 10.3 mg/dL 9.4 9.8 9.5   CBC Latest Ref Rng 05/20/2016 05/07/2016 10/07/2014  WBC 4.0 - 10.5 K/uL 4.7 4.1 4.0  Hemoglobin 13.0 - 17.0 g/dL 14.7 15.3 10.5(L)  Hematocrit 39.0 - 52.0 % 44.2 44.5 31.9(L)  Platelets 150 - 400 K/uL 116(L) 147 111(L)      Discharge Exam: Blood pressure 152/86, pulse 72, temperature 98.5 F (36.9 C), temperature source Oral, resp. rate 16, height 6\' 2"  (1.88 m), weight 216 lb 0.8 oz (98 kg), SpO2 95 %.  Disposition: 01-Home or Self Care      Discharge Instructions    AMB Referral to Cardiac Rehabilitation - Phase II    Complete by:  As directed   Diagnosis:  Coronary Stents     Amb Referral to Cardiac Rehabilitation    Complete by:  As directed   Diagnosis:  Coronary Stents Comment - referring to [facility].. pt does not want to attend            Medication List    TAKE these medications        acetaminophen 500 MG tablet  Commonly known as:  TYLENOL  Take 500 mg by mouth every 8 (eight) hours as needed for moderate pain.     allopurinol 300 MG tablet  Commonly known as:  ZYLOPRIM  Take 150 mg by mouth daily.     aspirin 81 MG tablet  Take 81 mg by mouth daily.     clopidogrel 75 MG tablet  Commonly known as:  PLAVIX  Take 1 tablet (75 mg total) by mouth daily.     ezetimibe 10 MG tablet  Commonly known as:  ZETIA  Take 10 mg by mouth daily.     FOLIC ACID PO  Take [id number] mg by mouth daily.     levothyroxine 100 MCG tablet  Commonly known as:  SYNTHROID, LEVOTHROID    Take 100 mcg by mouth daily.     metFORMIN 500 MG tablet  Commonly known as:  GLUCOPHAGE  Take 1 tablet (500 mg total) by mouth 2 (two) times daily with a meal.  Start taking on:  05/22/2016     NITROSTAT 0.4 MG SL tablet  Generic drug:  nitroGLYCERIN  Place 0.4 mg under the tongue every 5 (five) minutes as needed for chest pain. DISSOLVE ONE TABLET UNDER THE TONGUE EVERY 5 MINUTES AS NEEDED FOR CHEST PAIN.  DO NOT EXCEED A TOTAL OF 3 DOSES IN 15 MINUTES     omega-3 acid ethyl esters 1 g capsule  Commonly known as:  LOVAZA  Take 2 capsules (2 g total) by mouth 2 (two) times daily.     pantoprazole 40 MG tablet  Commonly known as:  PROTONIX  Take 1 tablet (40 mg total) by mouth daily.     PARoxetine 20 MG tablet  Commonly known as:  PAXIL  Take 20 mg by mouth daily.  Notes to Patient:  DISCUSS WITH DOCTOR OR PHARMACIST ABOUT TAKING THIS AS NEEDED.     ramipril 10 MG capsule  Commonly known as:  ALTACE  Take 10 mg by mouth daily.     ranitidine 150 MG tablet  Commonly known as:  ZANTAC  Take 150 mg by mouth daily.       Follow-up Information    Follow up with Corban Pitter, PA-C On 06/05/2016.   Specialties:  Cardiology, Radiology   Why:  at 1130 AM at [facility] information:   [address] [place] [address] [phone]        Discharge Instructions: Call [facility] at [phone] if any bleeding, swelling or drainage at cath site.  May shower, no tub baths for 48 hours for groin sticks. No lifting over 5 pounds for 3 days.  No Driving for 3 days  Do not stop Plavix and Asprin  Heart Healthy diabetic diet.   Hold Metformin until the 14th, it may interact with cath dye.     Signed: Cecilie Kicks Nurse Practitioner-Certified [facility]: [facility] 05/20/2016, 2:52 PM  Time spent on discharge : 30 minutes.   The patient has been seen in conjunction with Cecilie Kicks, NP. All  aspects of care have been considered and discussed. The patient has been personally interviewed, examined,  and all clinical data has been reviewed.   History of previous coronary bypass grafting with saphenous vein graft failure. Underwent successful rotational atherectomy followed by stenting of the ostial to mid RCA by Dr. Martinique with a good angiographic result.  Right femoral shows no evidence of bleeding. Vital signs are stable. Laboratory data is unremarkable.  Plan discharge today. 2 week follow-up. Primary cardiologist is Martinique.

## 2016-05-20 NOTE — Discharge Instructions (Signed)
Call [facility] at [phone] if any bleeding, swelling or drainage at cath site.  May shower, no tub baths for 48 hours for groin sticks. No lifting over 5 pounds for 3 days.  No Driving for 3 days  Do not stop Plavix and Asprin  Heart Healthy diabetic diet.   Hold Metformin until the 14th, it may interact with cath dye.

## 2016-05-20 NOTE — Progress Notes (Signed)
CARDIAC REHAB PHASE I   PRE:  Rate/Rhythm: 88 SR  BP:  Supine:   Sitting: 136/78  Standing:    SaO2:   MODE:  Ambulation: 1000 ft   POST:  Rate/Rhythm: 93 SR  BP:  Supine:   Sitting: 170/99, 174/99  Standing:    SaO2:  [id number] Pt walked 1000 ft with steady gait. No CP. Education completed with pt who voiced understanding. Stressed importance of plavix with stent. Reviewed NTG use, heart healthy and diabetic diets, and ex ed. Discussed CRP 2 and will refer to [facility] but pt does not want to attend. Wants to continue to ex on his own. BP elevated after walk and NP and RN aware.   Graylon Good, RN BSN  05/20/2016 9:28 AM

## 2016-05-28 ENCOUNTER — Other Ambulatory Visit: Payer: Self-pay | Admitting: Pharmacist Clinician (PhC)/ Clinical Pharmacy Specialist

## 2016-05-28 MED ORDER — EVOLOCUMAB 140 MG/ML ~~LOC~~ SOAJ: 140.0000 mg | SUBCUTANEOUS | Status: DC

## 2016-06-05 ENCOUNTER — Ambulatory Visit (INDEPENDENT_AMBULATORY_CARE_PROVIDER_SITE_OTHER): Payer: Medicare Other | Admitting: Physician Assistant

## 2016-06-05 ENCOUNTER — Encounter: Payer: Self-pay | Admitting: Physician Assistant

## 2016-06-05 ENCOUNTER — Telehealth: Payer: Self-pay | Admitting: Physician Assistant

## 2016-06-05 VITALS — BP 116/64 | HR 76 | Ht 74.0 in | Wt 216.8 lb

## 2016-06-05 DIAGNOSIS — I10 Essential (primary) hypertension: Secondary | ICD-10-CM

## 2016-06-05 DIAGNOSIS — E785 Hyperlipidemia, unspecified: Secondary | ICD-10-CM | POA: Diagnosis not present

## 2016-06-05 DIAGNOSIS — I4719 Other supraventricular tachycardia: Secondary | ICD-10-CM | POA: Insufficient documentation

## 2016-06-05 DIAGNOSIS — I25119 Atherosclerotic heart disease of native coronary artery with unspecified angina pectoris: Secondary | ICD-10-CM

## 2016-06-05 DIAGNOSIS — I471 Supraventricular tachycardia: Secondary | ICD-10-CM

## 2016-06-05 DIAGNOSIS — I351 Nonrheumatic aortic (valve) insufficiency: Secondary | ICD-10-CM

## 2016-06-05 NOTE — Patient Instructions (Signed)
Medication Instructions:  Your physician recommends that you continue on your current medications as directed. Please refer to the Current Medication list given to you today.   Labwork: None ordered  Testing/Procedures: None ordered  Follow-Up: Follow up as planned with Dr.Jordan  Any Other Special Instructions Will Be Listed Below (If Applicable). Follow up with your primary care physician regarding your low platelet count    If you need a refill on your cardiac medications before your next appointment, please call your pharmacy.

## 2016-06-05 NOTE — Telephone Encounter (Signed)
After the patient's office visit was over today, I was reviewing his recent testing further - on page 10 of the EKG strips from his stress tested showed an abrupt end to a narrow-complex tachycardia (brief fast HR). There aren't any strips from the beginning of this event. The stress test interpretation did not mention the duration. The appearance raised question of atrial flutter. The patient has not had any palpitations. I had previously reviewed this with Dr. Martinique before his recent cath and we had decided to hold off on anticoagulation as this may have been related to ischemia in the setting of exercise. Dr. Martinique and I contemplated having him wear a heart monitor once his cath issues were over.  I had mentioned this to [name] several weeks ago before his cath, but now that that's behind [place], I think it is worthwhile to go ahead and get the 30 day event monitor. I called both home and mobile numbers but got his voicemail. I left a message on his mobile phone to call us back. When he calls back he should be informed of the above. Please arrange 30-day monitor when he returns our call.  Miguel Quincy PA-C

## 2016-06-05 NOTE — Progress Notes (Signed)
Cardiology Office Note    Date:  06/05/2016  ID:  Damian Provance, DOB November 20, 1945, MRN [id number] PCP:  Coy Saunas, MD  Cardiologist:  Martinique  Chief Complaint: f/u cath/PCI  History of Present Illness:  Field Haberberger is a 70 y.o. male with history of CAD (stent to Cx 1996, stent to RCA 2002, s/p CABGx3 in 2015, rotational atherectomy/DESx2 to RCA 05/2017), HLD (intolerant of statins due to elevated liver enzymes), HTN, DM, hemochromatosis, cervical disc disease, panic disorder, hypothyroidism, h/o marked sinus bradycardia (BB stopped) who presents for f/u cath/PCI. Last 2D Echo 09/2014: mild-mod LVH, EF 55-60%, cannot exclude RWMA, mild AI. When I last saw him, he was complaining of atypical chest pain but this was similar to what prompted CABG. Nuc was abnormal so he underwent diagnostic cath 05/11/16 showing severe, diffuse calcific disease and a very large dominant RCA - essentially, chronic occlusion at the ostium. There were brisk left to right collaterals. He had occluded SVG-OM. 50% mLAD, patent LIMA-LAD. LVEF and LVEDP were normal. The films were discussed with his primary cardiologist and he was brought back for staged PCI with successful rotational atherectomy and stenting of the ostial to mid RCA with DES x 2. DAPT was recommended indefinitely. Labs showed a chronic appearing thrombocytopenia. Lipids 05/07/16 showed LDL 156, trig 218, AST 65, ALT 71 which are generally stable from 2015. We had also checked an echo [id number] for systolic murmur showing mod LVH, EF 60-65%, grade 1 DD, mild AI.  He returns for follow-up feeling great. He is back to playing golf and walking regularly. No recurrent angina. No unusual bleeding noted. He was unaware of hx of low plt count.  Of note, on page 10 of the EKG strips from his stress tested he had an abrupt end to a narrow-complex tachycardia. I do not have any strips from the beginning of this event. The stress test interpretation did not mention the  duration. The morphology raised question of atrial flutter. The patient has not had any palpitations. I previously reviewed with Dr. Martinique. We decided to hold off on anticoagulation for now as this may have been related to ischemia in the setting of exercise.  Past Medical History:  Diagnosis Date  . Aortic insufficiency    a. Mild AI by echo 04/2016.  [name] Basal cell carcinoma, face   . Coronary disease    a. stent to Cx 1996. b. stent to RCA 2002. c. s/p CABGx3 in 2015. d.  rotational atherectomy and DESx2 to ostial to mid RCA.  [name] Elevated liver enzymes   . GERD (gastroesophageal reflux disease)   . H. pylori infection tx'd in the 1970s  . Hemochromatosis   . History of gout 1990s X 1  . Hypercholesterolemia   . Hypertension    Controlled  . Hypothyroid   . Myocardial infarction [facility]) 2002   "mild"  . Obesity   . Panic disorder    Hx of panic disorder  . S/P primary angioplasty with coronary stent and rotational atherectomy of ostial mRCA with DES X 2-- 7/11/7 05/20/2016  . Sinus bradycardia   . Thrombocytopenia ([place])    a. per review of labs.  . Type II diabetes mellitus ([place])     Past Surgical History:  Procedure Laterality Date  . BASAL CELL CARCINOMA EXCISION     "face & lips"  . CARDIAC CATHETERIZATION  11//2015  . CARDIAC CATHETERIZATION N/A 05/11/2016   Procedure: Left Heart Cath and Cors/Grafts Angiography;  Surgeon: Charlann Lange  Irish Lack, MD;  Location: [place] CV LAB;  Service: Cardiovascular;  Laterality: N/A;  . CARDIAC CATHETERIZATION N/A 05/19/2016   Procedure: Coronary/Graft Atherectomy;  Surgeon: Peter M Martinique, MD;  Location: [place] CV LAB;  Service: Cardiovascular;  Laterality: N/A;  . CARDIAC CATHETERIZATION N/A 05/19/2016   Procedure: Coronary Stent Intervention;  Surgeon: Peter M Martinique, MD;  Location: [place] CV LAB;  Service: Cardiovascular;  Laterality: N/A;  . CATARACT EXTRACTION W/ INTRAOCULAR LENS  IMPLANT, BILATERAL Bilateral 2016  . CORONARY  ANGIOPLASTY WITH STENT PLACEMENT  1996; 2002; 05/19/2016   "1 + 2 +2"  . CORONARY ARTERY BYPASS GRAFT N/A 10/03/2014   Procedure: CORONARY ARTERY BYPASS GRAFTING (CABG) x  three, using left internal mammary artery and right leg greater saphenous vein harvested endoscopically;  Surgeon: Melrose Nakayama, MD;  Location: [place];  Service: Open Heart Surgery;  Laterality: N/A;  . LEFT HEART CATHETERIZATION WITH CORONARY ANGIOGRAM N/A 09/28/2014   Procedure: LEFT HEART CATHETERIZATION WITH CORONARY ANGIOGRAM;  Surgeon: Troy Sine, MD;  Location: [facility] CATH LAB;  Service: Cardiovascular;  Laterality: N/A;  . TEE WITHOUT CARDIOVERSION N/A 10/03/2014   Procedure: TRANSESOPHAGEAL ECHOCARDIOGRAM (TEE);  Surgeon: Melrose Nakayama, MD;  Location: [place];  Service: Open Heart Surgery;  Laterality: N/A;  . TONSILLECTOMY  1950s  . TRIGGER FINGER RELEASE Bilateral 1990s-2000s    Current Medications: Current Outpatient Prescriptions  Medication Sig Dispense Refill  . acetaminophen (TYLENOL) 500 MG tablet Take 500 mg by mouth every 8 (eight) hours as needed for moderate pain.    [name] allopurinol (ZYLOPRIM) 300 MG tablet Take 150 mg by mouth daily.     [name] aspirin 81 MG tablet Take 81 mg by mouth daily.    . clopidogrel (PLAVIX) 75 MG tablet Take 1 tablet (75 mg total) by mouth daily. 90 tablet 3  . ezetimibe (ZETIA) 10 MG tablet Take 10 mg by mouth daily.      [name] FOLIC ACID PO Take [id number] mg by mouth daily.     [name] levothyroxine (SYNTHROID, LEVOTHROID) 100 MCG tablet Take 100 mcg by mouth daily.      . metFORMIN (GLUCOPHAGE) 500 MG tablet Take 1 tablet (500 mg total) by mouth 2 (two) times daily with a meal.    . nitroGLYCERIN (NITROSTAT) 0.4 MG SL tablet Place 0.4 mg under the tongue every 5 (five) minutes as needed for chest pain. DISSOLVE ONE TABLET UNDER THE TONGUE EVERY 5 MINUTES AS NEEDED FOR CHEST PAIN.  DO NOT EXCEED A TOTAL OF 3 DOSES IN 15 MINUTES    . omega-3 acid ethyl esters (LOVAZA) 1 g capsule Take 2  capsules (2 g total) by mouth 2 (two) times daily. 120 capsule 6  . pantoprazole (PROTONIX) 40 MG tablet Take 1 tablet (40 mg total) by mouth daily. 30 tablet 11  . PARoxetine (PAXIL) 20 MG tablet Take 20 mg by mouth daily.      . ramipril (ALTACE) 10 MG capsule Take 10 mg by mouth daily.    . ranitidine (ZANTAC) 150 MG tablet Take 150 mg by mouth daily.     No current facility-administered medications for this visit.      Allergies:   Statins   Social History   Social History  . Marital status: Married    Spouse name: N/A  . Number of children: 1  . Years of education: N/A   Occupational History  . inspector [employer]    Social History Main Topics  . Smoking  status: Former Smoker    Packs/day: 1.00    Years: 20.00    Types: Cigarettes    Start date: 09/20/1995    Quit date: 09/24/1995  . Smokeless tobacco: Never Used  . Alcohol use 3.6 oz/week    6 Cans of beer per week  . Drug use: No  . Sexual activity: Yes   Other Topics Concern  . None   Social History Narrative  . None     Family History:  The patient's family history includes Cancer in his brother and mother; Heart attack in his sister; Heart failure in his mother.   ROS:   Please see the history of present illness.   All other systems are reviewed and otherwise negative.    PHYSICAL EXAM:   VS:  BP 116/64   Pulse 76   Ht 6\' 2"  (1.88 m)   Wt 216 lb 12.8 oz (98.3 kg)   BMI 27.84 kg/m   BMI: Body mass index is 27.84 kg/m. GEN: Well nourished, well developed WM, in no acute distress  HEENT: normocephalic, atraumatic Neck: no JVD, carotid bruits, or masses Cardiac: RRR; no murmurs, rubs, or gallops, no edema  Respiratory:  clear to auscultation bilaterally, normal work of breathing GI: soft, nontender, nondistended, + BS MS: no deformity or atrophy  Skin: warm and dry, no rash, right groin cath site without hematoma, ecchymosis, or bruit. Neuro:  Alert and Oriented x 3, Strength and sensation are intact,  follows commands Psych: euthymic mood, full affect  Wt Readings from Last 3 Encounters:  06/05/16 216 lb 12.8 oz (98.3 kg)  05/20/16 216 lb 0.8 oz (98 kg)  05/11/16 220 lb (99.8 kg)      Studies/Labs Reviewed:   EKG:   EKG was not ordered today.  Recent Labs: 05/07/2016: ALT 71 05/20/2016: BUN 11; Creatinine, Ser 0.85; Hemoglobin 14.7; Platelets 116; Potassium 5.0; Sodium 140   Lipid Panel    Component Value Date/Time   CHOL 250 (H) 05/07/2016 0736   TRIG 218 (H) 05/07/2016 0736   HDL 50 05/07/2016 0736   CHOLHDL 5.0 05/07/2016 0736   VLDL 44 (H) 05/07/2016 0736   LDLCALC 156 (H) 05/07/2016 0736    Additional studies/ records that were reviewed today include: Summarized above    ASSESSMENT & PLAN:   1. CAD with recent angina - doing great on current regimen. Continue meds and regular physical activity. He plans to start eating healthier. We discussed observation for recurrent sx. Not on BB due to h/o marked sinus bradycardia.  2. Mild AI - follow clinically. 3. HTN - controlled on present regimen. 4. Hyperlipidemia with chronicaly elevated LFTs - LFTs were mildly elevated but stable on present regimen. He does not wish to see the pharmD right now for Repatha - he wants to eat healthier then have labs rechecked with his physical in December to reassess need for additional lipid control.  5. Thrombocytopenia - instructed pt to monitor for bleeding. I advised him to discuss further w/u with PCP. 6. Narrow complex tachycardia - after the patient's office visit was over, I was reviewing his recent testing further - on page 10 of the EKG strips from his stress tested showed an abrupt end to a narrow-complex tachycardia. I do not have any strips from the beginning of this event. The stress test interpretation did not mention the duration. The morphology raised question of atrial flutter. The patient has not had any palpitations. I previously reviewed with Dr. Martinique. We decided  to  hold off on anticoagulation for now as this may have been related to ischemia in the setting of exercise. I will be calling the patient to discuss 30 day event monitor with him. He is not on BB due to h/o bradycardia.  Disposition: F/u with Dr. Martinique as scheduled 10/2016.   Medication Adjustments/Labs and Tests Ordered: Current medicines are reviewed at length with the patient today.  Concerns regarding medicines are outlined above. Medication changes, Labs and Tests ordered today are summarized above and listed in the Patient Instructions accessible in Encounters.   Raechel Ache PA-C  06/05/2016 11:21 AM    [address], [place], [place]  [address] Phone: [phone]; Fax: [phone]

## 2016-06-08 ENCOUNTER — Telehealth: Payer: Self-pay | Admitting: Physician Assistant

## 2016-06-08 NOTE — Telephone Encounter (Signed)
Note opened in error. Maanav Kassabian PA-C  

## 2016-06-09 ENCOUNTER — Telehealth: Payer: Self-pay | Admitting: *Deleted

## 2016-06-09 ENCOUNTER — Telehealth: Payer: Self-pay | Admitting: Cardiology

## 2016-06-09 DIAGNOSIS — I471 Supraventricular tachycardia: Secondary | ICD-10-CM

## 2016-06-09 NOTE — Telephone Encounter (Signed)
Follow-up ° ° ° ° °The pt is returning the nurses call °

## 2016-06-09 NOTE — Addendum Note (Signed)
Addended by: Loren Racer on: 06/09/2016 03:21 PM   Modules accepted: Orders

## 2016-06-09 NOTE — Telephone Encounter (Signed)
Spoke with pt's wife, DPR on file. Advised wife of recommendation per Melina Copa, PA-C.  Wife would prefer that I speak with pt instead because she's afraid he may not agree to do the event monitor.  Provided wife with our number and asked that she have pt call us back.  Wife verbalized understanding and was in agreement with this plan.

## 2016-06-09 NOTE — Telephone Encounter (Signed)
Spoke with patient. He was returning a call to [place]  He states he is agreeable to wear event monitor but prefers to start wearing it after his vacation. He will call in after his vacation to make monitor appointment.   He states he is doing fine - just finished playing golf.   Message routed to [place], [place], D. Idolina Primer, PA, Dr. Martinique as Juluis Rainier

## 2016-06-09 NOTE — Telephone Encounter (Signed)
Spoke with pt and reviewed recommendations and reasons for monitor with pt.  Pt states at this time he does not wish to proceed with wearing a monitor.  Pt states if he has any issues he will contact Dr. Doug Sou office.  Reminded him again of reasons we want to place monitor and pt still adamant, but pleasant about not wanting to wear monitor.  Will forward to Melina Copa, PA-C to make her aware.

## 2016-06-09 NOTE — Telephone Encounter (Signed)
Thanks

## 2016-06-09 NOTE — Telephone Encounter (Signed)
Pt now agreeable to monitor.  Order placed and will have someone from [facility] call pt to schedule him for monitor placement.

## 2016-06-09 NOTE — Telephone Encounter (Signed)
-----   Message from Theresa Pitter, [place] sent at 06/09/2016 12:01 PM EDT ----- Regarding: Event monitor Hi y'all, I wasn't sure if Jeanann Lewandowsky was out or not because I had a patient message me back about lab results that had not yet been called to him. So I wanted to send this to triage to make sure someone looks at it - was wondering if you could help me out with something.  I saw this patient in the office in follow-up the other day and after the visit was over, I realized we needed to re-visit the plan for a 30 day event monitor.  I was reviewing his recent testing further - on page 10 of the EKG strips from his recent stress tested showedan abrupt end to a narrow-complex tachycardia (brief fast HR). There weren't any strips from the beginning of this event. The stress test interpretation did not mention the duration. The appearance raisedquestion of atrial flutter. The patient has not had any palpitations. I had previously reviewed this with Dr. Martinique before his recent cath and we had decided to hold off on anticoagulation as this may have been related to ischemia in the setting of exercise. Dr. Martinique and I contemplated having him wear a heart monitor once his cath issues were over.  I had mentioned this to Mr. Beekman several weeks ago before his cath, but now that that's behind [place], I think it is worthwhile to go ahead and get the 30 day event monitor. I called both home and mobile numbers but got his voicemail. I left a message on his mobile phone to call us back.   It doesn't look like he has called back - can you try again and arrange a 30-day monitor? For SVT. Thank you!  Dayna Dunn PA-C

## 2016-06-25 ENCOUNTER — Telehealth: Payer: Self-pay | Admitting: Cardiology

## 2016-06-25 NOTE — Telephone Encounter (Signed)
Returned call to patient he stated he was returning [facility] Dunn's call.Advised I will send message to her.

## 2016-06-25 NOTE — Telephone Encounter (Signed)
I did not call the patient.  I called him about 2-3 weeks ago and left a message but I think he's since talked to a nurse about it. Dayna Dunn PA-C

## 2016-06-25 NOTE — Telephone Encounter (Signed)
New message ° ° °Pt verbalized that he is returning call °

## 2016-06-26 NOTE — Telephone Encounter (Signed)
Returned call to patient 06/24/16.Advised Melina Copa PA spoke to you 2 to 3 weeks ago.Stated he is doing well.He wanted to ask Dr.Jordan if he needs to wear 30 day event monitor.Stated he has not noticed any fast heart beat.Advised I will speak to Dr.Jordan on Monday 06/29/16 and call you back.

## 2016-06-29 NOTE — Telephone Encounter (Signed)
Returned call to patient left message on personal voice mail.Spoke to [place] today he advised not to wear monitor.Advised to call back if has any problems.Keep appointment with Dr.Jordan 11/06/16 at 11:30 am.

## 2016-07-01 ENCOUNTER — Ambulatory Visit: Payer: Medicare Other | Admitting: Cardiology

## 2016-07-02 ENCOUNTER — Other Ambulatory Visit: Payer: Self-pay | Admitting: Cardiology

## 2016-07-14 NOTE — Telephone Encounter (Signed)
Spoke with Mr. Orender and he stated that he spoke with Malachy Mood - Dr. Martinique nurse and was told he didn't need to do the event monitor now.

## 2016-08-28 ENCOUNTER — Telehealth: Payer: Self-pay | Admitting: Physician Assistant

## 2016-08-28 NOTE — Telephone Encounter (Signed)
Called pt home number spoke to his wife Pam she advised that Pt no longer needed the monitor and wished to be taken off the list. I have removed pt from the workque at this time.   Miguel Vasquez

## 2016-11-02 NOTE — Progress Notes (Signed)
Cardiology Office Note    Date:  11/06/2016  ID:  Miguel Vasquez, DOB 07-19-1946, MRN [id number] PCP:  Coy Saunas, MD  Cardiologist:  Mayson Sterbenz Martinique MD  Chief Complaint: f/u CAD  History of Present Illness:  Miguel Vasquez is a 70 y.o. male with history of CAD (stent to Cx 1996, stent to RCA 2002, s/p CABGx3 in 2015, rotational atherectomy/DESx2 to RCA 05/2017), HLD (intolerant of statins due to elevated liver/muscle enzymes), HTN, DM, hemochromatosis, cervical disc disease, panic disorder, hypothyroidism, h/o marked sinus bradycardia (BB stopped) who presents for f/u CAD.  Last 2D Echo 09/2014: mild-mod LVH, EF 55-60%, cannot exclude RWMA, mild AI. This past summer he was complaining of atypical chest pain but this was similar to what prompted CABG. Nuc was abnormal so he underwent diagnostic cath 05/11/16 showing severe, diffuse calcific disease and a very large dominant RCA - essentially, chronic occlusion at the ostium. There were brisk left to right collaterals. He had occluded SVG-OM. 50% mLAD, patent LIMA-LAD. LVEF and LVEDP were normal. He was brought back for staged PCI with successful rotational atherectomy and stenting of the ostial to mid RCA with DES x 2. DAPT was recommended indefinitely.  We had also checked an echo 123456 for systolic murmur showing mod LVH, EF 60-65%, grade 1 DD, mild AI.  On follow up today he continues to feel great. Denies any angina or SOB. He plays golf regularly and does a lot of yard work. He is tolerating his medication well.   Past Medical History:  Diagnosis Date  . Aortic insufficiency    a. Mild AI by echo 04/2016.  [name] Basal cell carcinoma, face   . Coronary disease    a. stent to Cx 1996. b. stent to RCA 2002. c. s/p CABGx3 in 2015. d.  rotational atherectomy and DESx2 to ostial to mid RCA.  [name] Elevated liver enzymes   . GERD (gastroesophageal reflux disease)   . H. pylori infection tx'd in the 1970s  . Hemochromatosis   . History of gout 1990s X 1   . Hypercholesterolemia   . Hypertension    Controlled  . Hypothyroid   . Myocardial infarction 2002   "mild"  . Obesity   . Panic disorder    Hx of panic disorder  . S/P primary angioplasty with coronary stent and rotational atherectomy of ostial mRCA with DES X 2-- 7/11/7 05/20/2016  . Sinus bradycardia    a. h/o marked sinus bradycardia, BB stopped.  . Tachycardia    a. Noted on stress test strips in 2017. ? Atrial flutter. Further w/u underway.  . Thrombocytopenia ([place])    a. per review of labs.  . Type II diabetes mellitus ([place])     Past Surgical History:  Procedure Laterality Date  . BASAL CELL CARCINOMA EXCISION     "face & lips"  . CARDIAC CATHETERIZATION  11//2015  . CARDIAC CATHETERIZATION N/A 05/11/2016   Procedure: Left Heart Cath and Cors/Grafts Angiography;  Surgeon: Jettie Booze, MD;  Location: [facility];  Service: Cardiovascular;  Laterality: N/A;  . CARDIAC CATHETERIZATION N/A 05/19/2016   Procedure: Coronary/Graft Atherectomy;  Surgeon: Dorean Daniello M Martinique, MD;  Location: [facility] [facility];  Service: Cardiovascular;  Laterality: N/A;  . CARDIAC CATHETERIZATION N/A 05/19/2016   Procedure: Coronary Stent Intervention;  Surgeon: Nicolo Tomko M Martinique, MD;  Location: [facility];  Service: Cardiovascular;  Laterality: N/A;  . CATARACT EXTRACTION W/ INTRAOCULAR LENS  IMPLANT, BILATERAL Bilateral 2016  .  CORONARY ANGIOPLASTY WITH STENT PLACEMENT  1996; 2002; 05/19/2016   "1 + 2 +2"  . CORONARY ARTERY BYPASS GRAFT N/A 10/03/2014   Procedure: CORONARY ARTERY BYPASS GRAFTING (CABG) x  three, using left internal mammary artery and right leg greater saphenous vein harvested endoscopically;  Surgeon: Melrose Nakayama, MD;  Location: [facility];  Service: Open Heart Surgery;  Laterality: N/A;  . LEFT HEART CATHETERIZATION WITH CORONARY ANGIOGRAM N/A 09/28/2014   Procedure: LEFT HEART CATHETERIZATION WITH CORONARY ANGIOGRAM;  Surgeon: Troy Sine, MD;  [facility]  CATH [facility];  Service: Cardiovascular;  Laterality: N/A;  . TEE WITHOUT CARDIOVERSION N/A 10/03/2014   Procedure: TRANSESOPHAGEAL ECHOCARDIOGRAM (TEE);  Surgeon: Melrose Nakayama, MD;  Location: [facility];  Service: Open Heart Surgery;  Laterality: N/A;  . TONSILLECTOMY  1950s  . TRIGGER FINGER RELEASE Bilateral 1990s-2000s    Current Medications: Current Outpatient Prescriptions  Medication Sig Dispense Refill  . allopurinol (ZYLOPRIM) 300 MG tablet Take 150 mg by mouth daily.     [name] aspirin 81 MG tablet Take 81 mg by mouth daily.    . clopidogrel (PLAVIX) 75 MG tablet Take 1 tablet (75 mg total) by mouth daily. 90 tablet 3  . ezetimibe (ZETIA) 10 MG tablet Take 10 mg by mouth daily.      [name] FOLIC ACID PO Take [id number] mg by mouth daily.     [name] levothyroxine (SYNTHROID, LEVOTHROID) 100 MCG tablet Take 100 mcg by mouth daily.      . metFORMIN (GLUCOPHAGE) 500 MG tablet Take 1 tablet (500 mg total) by mouth 2 (two) times daily with a meal.    . nitroGLYCERIN (NITROSTAT) 0.4 MG SL tablet Place 0.4 mg under the tongue every 5 (five) minutes as needed for chest pain. DISSOLVE ONE TABLET UNDER THE TONGUE EVERY 5 MINUTES AS NEEDED FOR CHEST PAIN.  DO NOT EXCEED A TOTAL OF 3 DOSES IN 15 MINUTES    . omega-3 acid ethyl esters (LOVAZA) 1 g capsule TAKE TWO CAPSULES BY MOUTH TWICE DAILY 120 capsule 6  . pantoprazole (PROTONIX) 40 MG tablet Take 1 tablet (40 mg total) by mouth daily. 30 tablet 11  . PARoxetine (PAXIL) 20 MG tablet Take 20 mg by mouth daily.      . ramipril (ALTACE) 10 MG capsule Take 10 mg by mouth daily.     No current facility-administered medications for this visit.      Allergies:   Statins   Social History   Social History  . Marital status: Married    Spouse name: N/A  . Number of children: 1  . Years of education: N/A   Occupational History  . inspector [employer]    Social History Main Topics  . Smoking status: Former Smoker    Packs/day: 1.00    Years: 20.00    Types:  Cigarettes    Start date: 09/20/1995    Quit date: 09/24/1995  . Smokeless tobacco: Never Used  . Alcohol use 3.6 oz/week    6 Cans of beer per week  . Drug use: No  . Sexual activity: Yes   Other Topics Concern  . None   Social History Narrative  . None     Family History:  The patient's family history includes Cancer in his brother and mother; Heart attack in his sister; Heart failure in his mother.   ROS:   Please see the history of present illness.   All other systems are reviewed and otherwise negative.  PHYSICAL EXAM:   VS:  BP (!) 144/84   Pulse 81   Ht 6\' 2"  (1.88 m)   Wt 219 lb (99.3 kg)   BMI 28.12 kg/m   BMI: Body mass index is 28.12 kg/m. GEN: Well nourished, well developed WM, in no acute distress  HEENT: normocephalic, atraumatic Neck: no JVD, carotid bruits, or masses Cardiac: RRR; no murmurs, rubs, or gallops, no edema  Respiratory:  clear to auscultation bilaterally, normal work of breathing GI: soft, nontender, nondistended, + BS MS: no deformity or atrophy  Skin: warm and dry Neuro:  Alert and Oriented x 3, Strength and sensation are intact, follows commands Psych: euthymic mood, full affect  Wt Readings from Last 3 Encounters:  11/06/16 219 lb (99.3 kg)  06/05/16 216 lb 12.8 oz (98.3 kg)  05/20/16 216 lb 0.8 oz (98 kg)      Studies/Labs Reviewed:   EKG:   EKG was not ordered today.  Recent Labs: 05/07/2016: ALT 71 05/20/2016: BUN 11; Creatinine, Ser 0.85; Hemoglobin 14.7; Platelets 116; Potassium 5.0; Sodium 140   Lipid Panel    Component Value Date/Time   CHOL 250 (H) 05/07/2016 0736   TRIG 218 (H) 05/07/2016 0736   HDL 50 05/07/2016 0736   CHOLHDL 5.0 05/07/2016 0736   VLDL 44 (H) 05/07/2016 0736   LDLCALC 156 (H) 05/07/2016 0736    Additional studies/ records that were reviewed today include: Summarized above    ASSESSMENT & PLAN:   1. CAD s/p CABG. S/p rotational atherectomy and stenting of the RCA in July 2017 -  doing great on current regimen. Continue meds and regular physical activity. He plans to start eating healthier. Not on BB due to h/o marked sinus bradycardia.  2. Mild AI - follow clinically. 3. HTN - controlled on present regimen. 4. Hyperlipidemia with chronicaly elevated LFTs - LFTs were mildly elevated but stable on present regimen. We discussed PCSK9 inhibitors but states they are too expensive. I asked him to consider a clinical trial for newer lipid lowering agents. He is going to consider and let me know in which case I will contact our research team. 5. Thrombocytopenia - no bleeding  Disposition: F/u with me in 6 months.   Medication Adjustments/Labs and Tests Ordered: Current medicines are reviewed at length with the patient today.  Concerns regarding medicines are outlined above. Medication changes, Labs and Tests ordered today are summarized above and listed in the Patient Instructions accessible in Encounters.   Signed, Darrell Hauk Martinique MD, [facility]  11/06/2016 12:45 PM    [place]

## 2016-11-06 ENCOUNTER — Ambulatory Visit (INDEPENDENT_AMBULATORY_CARE_PROVIDER_SITE_OTHER): Payer: Medicare Other | Admitting: Cardiology

## 2016-11-06 ENCOUNTER — Encounter: Payer: Self-pay | Admitting: Cardiology

## 2016-11-06 VITALS — BP 144/84 | HR 81 | Ht 74.0 in | Wt 219.0 lb

## 2016-11-06 DIAGNOSIS — Z951 Presence of aortocoronary bypass graft: Secondary | ICD-10-CM | POA: Diagnosis not present

## 2016-11-06 DIAGNOSIS — E7800 Pure hypercholesterolemia, unspecified: Secondary | ICD-10-CM | POA: Diagnosis not present

## 2016-11-06 DIAGNOSIS — I2510 Atherosclerotic heart disease of native coronary artery without angina pectoris: Secondary | ICD-10-CM

## 2016-11-06 DIAGNOSIS — I10 Essential (primary) hypertension: Secondary | ICD-10-CM | POA: Diagnosis not present

## 2016-11-06 NOTE — Patient Instructions (Signed)
Continue your current therapy  Let me know if you are interested in a Research trial for your cholesterol   I will see you in 6 months.

## 2017-01-28 ENCOUNTER — Telehealth: Payer: Self-pay | Admitting: *Deleted

## 2017-01-28 NOTE — Telephone Encounter (Signed)
I called patient today for Clear Study and left him a message. Patient called me back and we discussed the clear Study. I emailed him the consent for him to look over. He will call me back if interested. He wants to follow-up with primary doctor first and have labs drawn.

## 2017-02-04 ENCOUNTER — Other Ambulatory Visit: Payer: Self-pay | Admitting: Cardiology

## 2017-03-02 ENCOUNTER — Telehealth: Payer: Self-pay | Admitting: Cardiology

## 2017-03-02 NOTE — Telephone Encounter (Signed)
Pt said he needs to talk to you,he did not give me any information.

## 2017-03-02 NOTE — Telephone Encounter (Signed)
Returned call to patient.He stated he had recent lab at PCP Dr.Davis.Stated he has always had a fatty liver.Stated recent lab showed ast and alt to be elevated.He has had these to be elevated in the past, but worse this time.He wanted to make sure plavix was not causing.Advised plavix will not cause liver elevation.Stated PCP is doing more test on liver.Advised to keep appointment with Dr.Jordan 04/29/17 at 11:00 am.Advised to have PCP fax lab results.

## 2017-04-27 NOTE — Progress Notes (Signed)
Cardiology Office Note    Date:  04/29/2017  ID:  Miguel Vasquez, DOB 29-Aug-1946, MRN [id number] PCP:  Coy Saunas, MD  Cardiologist:  Peter Martinique MD  Chief Complaint: f/u CAD  History of Present Illness:  Miguel Vasquez is a 71 y.o. male with history of CAD (stent to Cx 1996, stent to RCA 2002, s/p CABGx3 in 2015, rotational atherectomy/DESx2 to RCA 05/2017), HLD (intolerant of statins due to elevated liver/muscle enzymes), HTN, DM, hemochromatosis, cervical disc disease, panic disorder, hypothyroidism, h/o marked sinus bradycardia (BB stopped) who presents for f/u CAD.  Last 2D Echo 09/2014: mild-mod LVH, EF 55-60%, cannot exclude RWMA, mild AI. This past summer he was complaining of atypical chest pain but this was similar to what prompted CABG. Nuc was abnormal so he underwent diagnostic cath 05/11/16 showing severe, diffuse calcific disease and a very large dominant RCA - essentially, chronic occlusion at the ostium. There were brisk left to right collaterals. He had occluded SVG-OM. 50% mLAD, patent LIMA-LAD. LVEF and LVEDP were normal. He was brought back for staged PCI with successful rotational atherectomy and stenting of the ostial to mid RCA with DES x 2. DAPT was recommended indefinitely.  We had also checked an echo 06/19/90 for systolic murmur showing mod LVH, EF 60-65%, grade 1 DD, mild AI.  On follow up today he continues to feel great. Denies any angina or SOB. He plays golf regularly and walks several days a week. No throat pain which is his anginal symptom.  He is tolerating his medication well.  He did have recent evaluation for his elevated transaminases. US showed a fatty liver.   Past Medical History:  Diagnosis Date  . Aortic insufficiency    a. Mild AI by echo 04/2016.  [name] Basal cell carcinoma, face   . Coronary disease    a. stent to Cx 1996. b. stent to RCA 2002. c. s/p CABGx3 in 2015. d.  rotational atherectomy and DESx2 to ostial to mid RCA.  [name] Elevated liver enzymes    . GERD (gastroesophageal reflux disease)   . H. pylori infection tx'd in the 1970s  . Hemochromatosis   . History of gout 1990s X 1  . Hypercholesterolemia   . Hypertension    Controlled  . Hypothyroid   . Myocardial infarction [facility]) 2002   "mild"  . Obesity   . Panic disorder    Hx of panic disorder  . S/P primary angioplasty with coronary stent and rotational atherectomy of ostial mRCA with DES X 2-- 7/11/7 05/20/2016  . Sinus bradycardia    a. h/o marked sinus bradycardia, BB stopped.  . Tachycardia    a. Noted on stress test strips in 2017. ? Atrial flutter. Further w/u underway.  . Thrombocytopenia ([place])    a. per review of labs.  . Type II diabetes mellitus ([place])     Past Surgical History:  Procedure Laterality Date  . BASAL CELL CARCINOMA EXCISION     "face & lips"  . CARDIAC CATHETERIZATION  11//2015  . CARDIAC CATHETERIZATION N/A 05/11/2016   Procedure: Left Heart Cath and Cors/Grafts Angiography;  Surgeon: Jettie Booze, MD;  Location: [place] [facility];  Service: Cardiovascular;  Laterality: N/A;  . CARDIAC CATHETERIZATION N/A 05/19/2016   Procedure: Coronary/Graft Atherectomy;  Surgeon: Peter M Martinique, MD;  Location: [place] [facility];  Service: Cardiovascular;  Laterality: N/A;  . CARDIAC CATHETERIZATION N/A 05/19/2016   Procedure: Coronary Stent Intervention;  Surgeon: Peter M Martinique, MD;  Location: [place] [facility];  Service: Cardiovascular;  Laterality: N/A;  . CATARACT EXTRACTION W/ INTRAOCULAR LENS  IMPLANT, BILATERAL Bilateral 2016  . CORONARY ANGIOPLASTY WITH STENT PLACEMENT  1996; 2002; 05/19/2016   "1 + 2 +2"  . CORONARY ARTERY BYPASS GRAFT N/A 10/03/2014   Procedure: CORONARY ARTERY BYPASS GRAFTING (CABG) x  three, using left internal mammary artery and right leg greater saphenous vein harvested endoscopically;  Surgeon: Melrose Nakayama, MD;  Location: [place];  Service: Open Heart Surgery;  Laterality: N/A;  . LEFT HEART CATHETERIZATION WITH  CORONARY ANGIOGRAM N/A 09/28/2014   Procedure: LEFT HEART CATHETERIZATION WITH CORONARY ANGIOGRAM;  Surgeon: Troy Sine, MD;  Location: [facility] CATH LAB;  Service: Cardiovascular;  Laterality: N/A;  . TEE WITHOUT CARDIOVERSION N/A 10/03/2014   Procedure: TRANSESOPHAGEAL ECHOCARDIOGRAM (TEE);  Surgeon: Melrose Nakayama, MD;  Location: [place];  Service: Open Heart Surgery;  Laterality: N/A;  . TONSILLECTOMY  1950s  . TRIGGER FINGER RELEASE Bilateral 1990s-2000s    Current Medications: Current Outpatient Prescriptions  Medication Sig Dispense Refill  . allopurinol (ZYLOPRIM) 300 MG tablet Take 150 mg by mouth daily.     [name] aspirin 81 MG tablet Take 81 mg by mouth daily.    . clopidogrel (PLAVIX) 75 MG tablet Take 1 tablet (75 mg total) by mouth daily. 90 tablet 3  . ezetimibe (ZETIA) 10 MG tablet Take 10 mg by mouth daily.      [name] FOLIC ACID PO Take 564 mg by mouth daily.     [name] levothyroxine (SYNTHROID, LEVOTHROID) 100 MCG tablet Take 100 mcg by mouth daily.      . metFORMIN (GLUCOPHAGE) 500 MG tablet Take 1 tablet (500 mg total) by mouth 2 (two) times daily with a meal.    . nitroGLYCERIN (NITROSTAT) 0.4 MG SL tablet Place 0.4 mg under the tongue every 5 (five) minutes as needed for chest pain. DISSOLVE ONE TABLET UNDER THE TONGUE EVERY 5 MINUTES AS NEEDED FOR CHEST PAIN.  DO NOT EXCEED A TOTAL OF 3 DOSES IN 15 MINUTES    . omega-3 acid ethyl esters (LOVAZA) 1 g capsule TAKE TWO CAPSULES BY MOUTH TWICE DAILY 120 capsule 6  . pantoprazole (PROTONIX) 40 MG tablet Take 1 tablet (40 mg total) by mouth daily. 30 tablet 11  . PARoxetine (PAXIL) 20 MG tablet Take 20 mg by mouth daily.      . ramipril (ALTACE) 10 MG capsule Take 10 mg by mouth daily.     No current facility-administered medications for this visit.      Allergies:   Statins   Social History   Social History  . Marital status: Married    Spouse name: N/A  . Number of children: 1  . Years of education: N/A   Occupational  History  . inspector [employer]    Social History Main Topics  . Smoking status: Former Smoker    Packs/day: 1.00    Years: 20.00    Types: Cigarettes    Start date: 09/20/1995    Quit date: 09/24/1995  . Smokeless tobacco: Never Used  . Alcohol use 3.6 oz/week    6 Cans of beer per week  . Drug use: No  . Sexual activity: Yes   Other Topics Concern  . None   Social History Narrative  . None     Family History:  The patient's family history includes Cancer in his brother and mother; Heart attack in his sister; Heart failure in his mother.  ROS:   Please see the history of present illness.   All other systems are reviewed and otherwise negative.    PHYSICAL EXAM:   VS:  BP (!) 146/84   Pulse 69   Ht 6\' 2"  (1.88 m)   Wt 209 lb (94.8 kg)   BMI 26.83 kg/m   BMI: Body mass index is 26.83 kg/m. GEN: Well nourished, well developed WM, in no acute distress  HEENT: normocephalic, atraumatic Neck: no JVD, carotid bruits, or masses Cardiac: RRR; no murmurs, rubs, or gallops, no edema  Respiratory:  clear to auscultation bilaterally, normal work of breathing GI: soft, nontender, nondistended, + BS MS: no deformity or atrophy  Skin: warm and dry Neuro:  Alert and Oriented x 3, Strength and sensation are intact, follows commands Psych: euthymic mood, full affect  Wt Readings from Last 3 Encounters:  04/29/17 209 lb (94.8 kg)  11/06/16 219 lb (99.3 kg)  06/05/16 216 lb 12.8 oz (98.3 kg)      Studies/Labs Reviewed:   EKG:   EKG was not ordered today.  Recent Labs: 05/07/2016: ALT 71 05/20/2016: BUN 11; Creatinine, Ser 0.85; Hemoglobin 14.7; Platelets 116; Potassium 5.0; Sodium 140   Lipid Panel    Component Value Date/Time   CHOL 250 (H) 05/07/2016 0736   TRIG 218 (H) 05/07/2016 0736   HDL 50 05/07/2016 0736   CHOLHDL 5.0 05/07/2016 0736   VLDL 44 (H) 05/07/2016 0736   LDLCALC 156 (H) 05/07/2016 0736    Additional studies/ records that were reviewed today  include:  Labs dated 02/16/17: cholesterol 266, triglycerides 253, HDL 46, LDL 169. CBC normal except platelets 129K.  Dated 02/24/17: A1c 7.3%. Iron studies and TFTs normal. AST 90, ALT 128. Other chemistries are normal.   Ecg today shows NSR with nonspecific TWA. I have personally reviewed and interpreted this study.  ASSESSMENT & PLAN:   1. CAD s/p CABG. S/p rotational atherectomy and stenting of the RCA in July 2017 - doing great on current regimen. Continue meds and regular physical activity. He is eating healthier and has lost 10 lbs.  Not on BB due to h/o marked sinus bradycardia.  2. Mild AI - follow clinically. 3. HTN - controlled on present regimen. 4. Hyperlipidemia with chronicaly elevated LFTs - US shows a fatty liver. LFTs were  elevated but stable on present regimen. We discussed PCSK9 inhibitors but states they are too expensive. Will get repeat lab work in August.  5. Thrombocytopenia - no bleeding  Disposition: F/u with me in 6 months.   Medication Adjustments/Labs and Tests Ordered: Current medicines are reviewed at length with the patient today.  Concerns regarding medicines are outlined above. Medication changes, Labs and Tests ordered today are summarized above and listed in the Patient Instructions accessible in Encounters.   Signed, Peter Martinique MD, [facility]  04/29/2017 10:58 AM    [place]

## 2017-04-29 ENCOUNTER — Encounter: Payer: Self-pay | Admitting: Cardiology

## 2017-04-29 ENCOUNTER — Ambulatory Visit (INDEPENDENT_AMBULATORY_CARE_PROVIDER_SITE_OTHER): Payer: Medicare Other | Admitting: Cardiology

## 2017-04-29 VITALS — BP 146/84 | HR 69 | Ht 74.0 in | Wt 209.0 lb

## 2017-04-29 DIAGNOSIS — I10 Essential (primary) hypertension: Secondary | ICD-10-CM

## 2017-04-29 DIAGNOSIS — E7800 Pure hypercholesterolemia, unspecified: Secondary | ICD-10-CM

## 2017-04-29 DIAGNOSIS — Z951 Presence of aortocoronary bypass graft: Secondary | ICD-10-CM | POA: Diagnosis not present

## 2017-04-29 DIAGNOSIS — I2510 Atherosclerotic heart disease of native coronary artery without angina pectoris: Secondary | ICD-10-CM | POA: Diagnosis not present

## 2017-04-29 NOTE — Patient Instructions (Signed)
Continue your current therapy  I will see you in 6 months.   

## 2017-06-04 ENCOUNTER — Other Ambulatory Visit: Payer: Self-pay | Admitting: Cardiology

## 2017-09-05 ENCOUNTER — Other Ambulatory Visit: Payer: Self-pay | Admitting: Cardiology

## 2017-09-06 NOTE — Telephone Encounter (Signed)
REFILL 

## 2017-10-20 ENCOUNTER — Ambulatory Visit: Payer: Medicare Other | Admitting: Cardiology

## 2017-12-02 ENCOUNTER — Other Ambulatory Visit: Payer: Self-pay | Admitting: Cardiology

## 2017-12-04 ENCOUNTER — Other Ambulatory Visit: Payer: Self-pay | Admitting: Cardiology

## 2017-12-06 NOTE — Telephone Encounter (Signed)
Rx request sent to pharmacy.  

## 2017-12-08 ENCOUNTER — Encounter: Payer: Self-pay | Admitting: *Deleted

## 2017-12-14 NOTE — Progress Notes (Deleted)
Cardiology Office Note    Date:  12/14/2017  ID:  Miguel Vasquez, DOB 1946-04-25, MRN [id number] PCP:  Coy Saunas, MD  Cardiologist:  Rehmat Murtagh Martinique MD  Chief Complaint: f/u CAD  History of Present Illness:  Miguel Vasquez is a 72 y.o. male with history of CAD (stent to Cx 1996, stent to RCA 2002, s/p CABGx3 in 2015, rotational atherectomy/DESx2 to RCA 05/2017), HLD (intolerant of statins due to elevated liver/muscle enzymes), HTN, DM, hemochromatosis, cervical disc disease, panic disorder, hypothyroidism, h/o marked sinus bradycardia (BB stopped) who presents for f/u CAD.  Last 2D Echo 09/2014: mild-mod LVH, EF 55-60%, cannot exclude RWMA, mild AI. This past summer he was complaining of atypical chest pain but this was similar to what prompted CABG. Nuc was abnormal so he underwent diagnostic cath 05/11/16 showing severe, diffuse calcific disease and a very large dominant RCA - essentially, chronic occlusion at the ostium. There were brisk left to right collaterals. He had occluded SVG-OM. 50% mLAD, patent LIMA-LAD. LVEF and LVEDP were normal. He was brought back for staged PCI with successful rotational atherectomy and stenting of the ostial to mid RCA with DES x 2. DAPT was recommended indefinitely.  We had also checked an echo 8/67/67 for systolic murmur showing mod LVH, EF 60-65%, grade 1 DD, mild AI.  On follow up today he continues to feel great. Denies any angina or SOB. He plays golf regularly and walks several days a week. No throat pain which is his anginal symptom.  He is tolerating his medication well.  He did have  evaluation for his elevated transaminases. US showed a fatty liver.   Past Medical History:  Diagnosis Date  . Aortic insufficiency    a. Mild AI by echo 04/2016.  [name] Basal cell carcinoma, face   . Coronary disease    a. stent to Cx 1996. b. stent to RCA 2002. c. s/p CABGx3 in 2015. d.  rotational atherectomy and DESx2 to ostial to mid RCA.  [name] Elevated liver enzymes   .  GERD (gastroesophageal reflux disease)   . H. pylori infection tx'd in the 1970s  . Hemochromatosis   . History of gout 1990s X 1  . Hypercholesterolemia   . Hypertension    Controlled  . Hypothyroid   . Myocardial infarction [facility]) 2002   "mild"  . Obesity   . Panic disorder    Hx of panic disorder  . S/P primary angioplasty with coronary stent and rotational atherectomy of ostial mRCA with DES X 2-- 7/11/7 05/20/2016  . Sinus bradycardia    a. h/o marked sinus bradycardia, BB stopped.  . Tachycardia    a. Noted on stress test strips in 2017. ? Atrial flutter. Further w/u underway.  . Thrombocytopenia ([place])    a. per review of labs.  . Type II diabetes mellitus ([place])     Past Surgical History:  Procedure Laterality Date  . BASAL CELL CARCINOMA EXCISION     "face & lips"  . CARDIAC CATHETERIZATION  11//2015  . CARDIAC CATHETERIZATION N/A 05/11/2016   Procedure: Left Heart Cath and Cors/Grafts Angiography;  Surgeon: Jettie Booze, MD;  Location: [place] CV LAB;  Service: Cardiovascular;  Laterality: N/A;  . CARDIAC CATHETERIZATION N/A 05/19/2016   Procedure: Coronary/Graft Atherectomy;  Surgeon: Tacoya Altizer M Martinique, MD;  Location: [place] CV LAB;  Service: Cardiovascular;  Laterality: N/A;  . CARDIAC CATHETERIZATION N/A 05/19/2016   Procedure: Coronary Stent Intervention;  Surgeon: Rosland Riding M Martinique, MD;  Location: [place] CV LAB;  Service: Cardiovascular;  Laterality: N/A;  . CATARACT EXTRACTION W/ INTRAOCULAR LENS  IMPLANT, BILATERAL Bilateral 2016  . CORONARY ANGIOPLASTY WITH STENT PLACEMENT  1996; 2002; 05/19/2016   "1 + 2 +2"  . CORONARY ARTERY BYPASS GRAFT N/A 10/03/2014   Procedure: CORONARY ARTERY BYPASS GRAFTING (CABG) x  three, using left internal mammary artery and right leg greater saphenous vein harvested endoscopically;  Surgeon: Melrose Nakayama, MD;  Location: [place];  Service: Open Heart Surgery;  Laterality: N/A;  . LEFT HEART CATHETERIZATION WITH  CORONARY ANGIOGRAM N/A 09/28/2014   Procedure: LEFT HEART CATHETERIZATION WITH CORONARY ANGIOGRAM;  Surgeon: Troy Sine, MD;  Location: [facility] CATH LAB;  Service: Cardiovascular;  Laterality: N/A;  . TEE WITHOUT CARDIOVERSION N/A 10/03/2014   Procedure: TRANSESOPHAGEAL ECHOCARDIOGRAM (TEE);  Surgeon: Melrose Nakayama, MD;  Location: [place];  Service: Open Heart Surgery;  Laterality: N/A;  . TONSILLECTOMY  1950s  . TRIGGER FINGER RELEASE Bilateral 1990s-2000s    Current Medications: Current Outpatient Medications  Medication Sig Dispense Refill  . allopurinol (ZYLOPRIM) 300 MG tablet Take 150 mg by mouth daily.     [name] aspirin 81 MG tablet Take 81 mg by mouth daily.    . clopidogrel (PLAVIX) 75 MG tablet TAKE 1 TABLET BY MOUTH ONCE DAILY 90 tablet 1  . ezetimibe (ZETIA) 10 MG tablet Take 10 mg by mouth daily.      [name] FOLIC ACID PO Take 174 mg by mouth daily.     [name] levothyroxine (SYNTHROID, LEVOTHROID) 100 MCG tablet Take 100 mcg by mouth daily.      . metFORMIN (GLUCOPHAGE) 500 MG tablet Take 1 tablet (500 mg total) by mouth 2 (two) times daily with a meal.    . nitroGLYCERIN (NITROSTAT) 0.4 MG SL tablet Place 0.4 mg under the tongue every 5 (five) minutes as needed for chest pain. DISSOLVE ONE TABLET UNDER THE TONGUE EVERY 5 MINUTES AS NEEDED FOR CHEST PAIN.  DO NOT EXCEED A TOTAL OF 3 DOSES IN 15 MINUTES    . omega-3 acid ethyl esters (LOVAZA) 1 g capsule TAKE 2 CAPSULES BY MOUTH TWICE DAILY 360 capsule 0  . pantoprazole (PROTONIX) 40 MG tablet Take 1 tablet (40 mg total) by mouth daily. 30 tablet 11  . PARoxetine (PAXIL) 20 MG tablet Take 20 mg by mouth daily.      . ramipril (ALTACE) 10 MG capsule Take 10 mg by mouth daily.     No current facility-administered medications for this visit.      Allergies:   Statins   Social History   Socioeconomic History  . Marital status: Married    Spouse name: Not on file  . Number of children: 1  . Years of education: Not on file  . Highest  education level: Not on file  Social Needs  . Financial resource strain: Not on file  . Food insecurity - worry: Not on file  . Food insecurity - inability: Not on file  . Transportation needs - medical: Not on file  . Transportation needs - non-medical: Not on file  Occupational History  . Occupation: Agricultural consultant [employer]  Tobacco Use  . Smoking status: Former Smoker    Packs/day: 1.00    Years: 20.00    Pack years: 20.00    Types: Cigarettes    Start date: 09/20/1995    Last attempt to quit: 09/24/1995    Years since quitting: 22.2  . Smokeless tobacco: Never Used  Substance and Sexual Activity  . Alcohol use: Yes    Alcohol/week: 3.6 oz    Types: 6 Cans of beer per week  . Drug use: No  . Sexual activity: Yes  Other Topics Concern  . Not on file  Social History Narrative  . Not on file     Family History:  The patient's family history includes Colon cancer in his brother and mother; GI problems in his father; Heart attack in his sister; Heart failure in his mother.   ROS:   Please see the history of present illness.   All other systems are reviewed and otherwise negative.    PHYSICAL EXAM:   VS:  There were no vitals taken for this visit.  BMI: There is no height or weight on file to calculate BMI. GEN: Well nourished, well developed WM, in no acute distress  HEENT: normocephalic, atraumatic Neck: no JVD, carotid bruits, or masses Cardiac: RRR; no murmurs, rubs, or gallops, no edema  Respiratory:  clear to auscultation bilaterally, normal work of breathing GI: soft, nontender, nondistended, + BS MS: no deformity or atrophy  Skin: warm and dry Neuro:  Alert and Oriented x 3, Strength and sensation are intact, follows commands Psych: euthymic mood, full affect  Wt Readings from Last 3 Encounters:  04/29/17 209 lb (94.8 kg)  11/06/16 219 lb (99.3 kg)  06/05/16 216 lb 12.8 oz (98.3 kg)      Studies/Labs Reviewed:   EKG:   EKG was not ordered today.  Recent  Labs: No results found for requested labs within last 8760 hours.   Lipid Panel    Component Value Date/Time   CHOL 250 (H) 05/07/2016 0736   TRIG 218 (H) 05/07/2016 0736   HDL 50 05/07/2016 0736   CHOLHDL 5.0 05/07/2016 0736   VLDL 44 (H) 05/07/2016 0736   LDLCALC 156 (H) 05/07/2016 0736    Additional studies/ records that were reviewed today include:  Labs dated 02/16/17: cholesterol 266, triglycerides 253, HDL 46, LDL 169. CBC normal except platelets 129K.  Dated 02/24/17: A1c 7.3%. Iron studies and TFTs normal. AST 90, ALT 128. Other chemistries are normal.   Ecg today shows NSR with nonspecific TWA. I have personally reviewed and interpreted this study.  ASSESSMENT & PLAN:   1. CAD s/p CABG. S/p rotational atherectomy and stenting of the RCA in July 2017 - doing great on current regimen. Continue meds and regular physical activity. He is eating healthier and has lost 10 lbs.  Not on BB due to h/o marked sinus bradycardia.  2. Mild AI - follow clinically. 3. HTN - controlled on present regimen. 4. Hyperlipidemia with chronicaly elevated LFTs - US shows a fatty liver. LFTs were  elevated but stable on present regimen. We discussed PCSK9 inhibitors but states they are too expensive. Will get repeat lab work in August.  5. Thrombocytopenia - no bleeding  Disposition: F/u with me in 6 months.    Signed, Ryver Zadrozny Martinique MD, [facility]  12/14/2017 12:17 PM    [place]

## 2017-12-16 ENCOUNTER — Ambulatory Visit: Payer: Medicare Other | Admitting: Cardiology

## 2018-01-01 NOTE — Progress Notes (Signed)
Cardiology Office Note    Date:  01/06/2018  ID:  Miguel Vasquez, DOB Jan 18, 1946, MRN [id number] PCP:  Coy Saunas, MD  Cardiologist:  Iliany Losier Martinique MD  Chief Complaint: f/u CAD  History of Present Illness:  Miguel Vasquez is a 72 y.o. male with history of CAD (stent to Cx 1996, stent to RCA 2002, s/p CABGx3 in 2015, rotational atherectomy/DESx2 to RCA 05/2017), HLD (intolerant of statins due to elevated liver/muscle enzymes), HTN, DM, hemochromatosis, cervical disc disease, panic disorder, hypothyroidism, h/o marked sinus bradycardia (BB stopped) who presents for f/u CAD.  Last 2D Echo 09/2014: mild-mod LVH, EF 55-60%, cannot exclude RWMA, mild AI. This past summer he was complaining of atypical chest pain but this was similar to what prompted CABG. Nuc was abnormal so he underwent diagnostic cath 05/11/16 showing severe, diffuse calcific disease and a very large dominant RCA - essentially, chronic occlusion at the ostium. There were brisk left to right collaterals. He had occluded SVG-OM. 50% mLAD, patent LIMA-LAD. LVEF and LVEDP were normal. He was brought back for staged PCI with successful rotational atherectomy and stenting of the ostial to mid RCA with DES x 2. DAPT was recommended indefinitely.  We had also checked an echo 1/44/31 for systolic murmur showing mod LVH, EF 60-65%, grade 1 DD, mild AI.  On follow up today he is doing very well. He denies any chest pain or dyspnea. He has joined a gym and is planning on exercising more. Has gained 7 lbs but now working on that. Reports his last blood work looked the best ever. Planning to have colonoscopy in May.   Past Medical History:  Diagnosis Date  . Aortic insufficiency    a. Mild AI by echo 04/2016.  [name] Basal cell carcinoma, face   . Coronary disease    a. stent to Cx 1996. b. stent to RCA 2002. c. s/p CABGx3 in 2015. d.  rotational atherectomy and DESx2 to ostial to mid RCA.  [name] Elevated liver enzymes   . GERD (gastroesophageal reflux  disease)   . H. pylori infection tx'd in the 1970s  . Hemochromatosis   . History of gout 1990s X 1  . Hypercholesterolemia   . Hypertension    Controlled  . Hypothyroid   . Myocardial infarction [facility]) 2002   "mild"  . Obesity   . Panic disorder    Hx of panic disorder  . S/P primary angioplasty with coronary stent and rotational atherectomy of ostial mRCA with DES X 2-- 7/11/7 05/20/2016  . Sinus bradycardia    a. h/o marked sinus bradycardia, BB stopped.  . Tachycardia    a. Noted on stress test strips in 2017. ? Atrial flutter. Further w/u underway.  . Thrombocytopenia ([place])    a. per review of labs.  . Type II diabetes mellitus ([place])     Past Surgical History:  Procedure Laterality Date  . BASAL CELL CARCINOMA EXCISION     "face & lips"  . CARDIAC CATHETERIZATION  11//2015  . CARDIAC CATHETERIZATION N/A 05/11/2016   Procedure: Left Heart Cath and Cors/Grafts Angiography;  Surgeon: Jettie Booze, MD;  Location: [place] CV LAB;  Service: Cardiovascular;  Laterality: N/A;  . CARDIAC CATHETERIZATION N/A 05/19/2016   Procedure: Coronary/Graft Atherectomy;  Surgeon: Taliyah Watrous M Martinique, MD;  Location: [place] CV LAB;  Service: Cardiovascular;  Laterality: N/A;  . CARDIAC CATHETERIZATION N/A 05/19/2016   Procedure: Coronary Stent Intervention;  Surgeon: Jora Galluzzo M Martinique, MD;  Location: [place]  CV LAB;  Service: Cardiovascular;  Laterality: N/A;  . CATARACT EXTRACTION W/ INTRAOCULAR LENS  IMPLANT, BILATERAL Bilateral 2016  . CORONARY ANGIOPLASTY WITH STENT PLACEMENT  1996; 2002; 05/19/2016   "1 + 2 +2"  . CORONARY ARTERY BYPASS GRAFT N/A 10/03/2014   Procedure: CORONARY ARTERY BYPASS GRAFTING (CABG) x  three, using left internal mammary artery and right leg greater saphenous vein harvested endoscopically;  Surgeon: Melrose Nakayama, MD;  Location: [place];  Service: Open Heart Surgery;  Laterality: N/A;  . LEFT HEART CATHETERIZATION WITH CORONARY ANGIOGRAM N/A 09/28/2014    Procedure: LEFT HEART CATHETERIZATION WITH CORONARY ANGIOGRAM;  Surgeon: Troy Sine, MD;  Location: [facility] CATH LAB;  Service: Cardiovascular;  Laterality: N/A;  . TEE WITHOUT CARDIOVERSION N/A 10/03/2014   Procedure: TRANSESOPHAGEAL ECHOCARDIOGRAM (TEE);  Surgeon: Melrose Nakayama, MD;  Location: [place];  Service: Open Heart Surgery;  Laterality: N/A;  . TONSILLECTOMY  1950s  . TRIGGER FINGER RELEASE Bilateral 1990s-2000s    Current Medications: Current Outpatient Medications  Medication Sig Dispense Refill  . allopurinol (ZYLOPRIM) 300 MG tablet Take 150 mg by mouth daily.     [name] aspirin 81 MG tablet Take 81 mg by mouth daily.    . clopidogrel (PLAVIX) 75 MG tablet TAKE 1 TABLET BY MOUTH ONCE DAILY 90 tablet 1  . ezetimibe (ZETIA) 10 MG tablet Take 10 mg by mouth daily.      [name] FOLIC ACID PO Take 604 mg by mouth daily.     [name] levothyroxine (SYNTHROID, LEVOTHROID) 100 MCG tablet Take 100 mcg by mouth daily.      . metFORMIN (GLUCOPHAGE) 500 MG tablet Take 1 tablet (500 mg total) by mouth 2 (two) times daily with a meal.    . nitroGLYCERIN (NITROSTAT) 0.4 MG SL tablet Place 0.4 mg under the tongue every 5 (five) minutes as needed for chest pain. DISSOLVE ONE TABLET UNDER THE TONGUE EVERY 5 MINUTES AS NEEDED FOR CHEST PAIN.  DO NOT EXCEED A TOTAL OF 3 DOSES IN 15 MINUTES    . omega-3 acid ethyl esters (LOVAZA) 1 g capsule TAKE 2 CAPSULES BY MOUTH TWICE DAILY 360 capsule 0  . pantoprazole (PROTONIX) 40 MG tablet Take 1 tablet (40 mg total) by mouth daily. 30 tablet 11  . PARoxetine (PAXIL) 20 MG tablet Take 20 mg by mouth daily.      . ramipril (ALTACE) 10 MG capsule Take 10 mg by mouth daily.     No current facility-administered medications for this visit.      Allergies:   Statins   Social History   Socioeconomic History  . Marital status: Married    Spouse name: None  . Number of children: 1  . Years of education: None  . Highest education level: None  Social Needs  . Financial  resource strain: None  . Food insecurity - worry: None  . Food insecurity - inability: None  . Transportation needs - medical: None  . Transportation needs - non-medical: None  Occupational History  . Occupation: Agricultural consultant [employer]  Tobacco Use  . Smoking status: Former Smoker    Packs/day: 1.00    Years: 20.00    Pack years: 20.00    Types: Cigarettes    Start date: 09/20/1995    Last attempt to quit: 09/24/1995    Years since quitting: 22.3  . Smokeless tobacco: Never Used  Substance and Sexual Activity  . Alcohol use: Yes    Alcohol/week: 3.6 oz    Types:  6 Cans of beer per week  . Drug use: No  . Sexual activity: Yes  Other Topics Concern  . None  Social History Narrative  . None     Family History:  The patient's family history includes Colon cancer in his brother and mother; GI problems in his father; Heart attack in his sister; Heart failure in his mother.   ROS:   Please see the history of present illness.   All other systems are reviewed and otherwise negative.    PHYSICAL EXAM:   VS:  BP (!) 142/82 (BP Location: Left Arm, Patient Position: Sitting, Cuff Size: Normal)   Pulse 74   Ht 6\' 2"  (1.88 m)   Wt 216 lb 6.4 oz (98.2 kg)   SpO2 95%   BMI 27.78 kg/m   BMI: Body mass index is 27.78 kg/m. GENERAL:  Well appearing WM in NAD HEENT:  PERRL, EOMI, sclera are clear. Oropharynx is clear. NECK:  No jugular venous distention, carotid upstroke brisk and symmetric, no bruits, no thyromegaly or adenopathy LUNGS:  Clear to auscultation bilaterally CHEST:  Unremarkable HEART:  RRR,  PMI not displaced or sustained,S1 and S2 within normal limits, no S3, no S4: no clicks, no rubs, no murmurs ABD:  Soft, nontender. BS +, no masses or bruits. No hepatomegaly, no splenomegaly EXT:  2 + pulses throughout, no edema, no cyanosis no clubbing SKIN:  Warm and dry.  No rashes NEURO:  Alert and oriented x 3. Cranial nerves II through XII intact. PSYCH:  Cognitively intact    Wt  Readings from Last 3 Encounters:  01/06/18 216 lb 6.4 oz (98.2 kg)  04/29/17 209 lb (94.8 kg)  11/06/16 219 lb (99.3 kg)      Studies/Labs Reviewed:   EKG:   EKG was not ordered today.  Recent Labs: No results found for requested labs within last 8760 hours.   Lipid Panel    Component Value Date/Time   CHOL 250 (H) 05/07/2016 0736   TRIG 218 (H) 05/07/2016 0736   HDL 50 05/07/2016 0736   CHOLHDL 5.0 05/07/2016 0736   VLDL 44 (H) 05/07/2016 0736   LDLCALC 156 (H) 05/07/2016 0736    Additional studies/ records that were reviewed today include:  Labs dated 02/16/17: cholesterol 266, triglycerides 253, HDL 46, LDL 169. CBC normal except platelets 129K.  Dated 02/24/17: A1c 7.3%. Iron studies and TFTs normal. AST 90, ALT 128. Other chemistries are normal.    ASSESSMENT & PLAN:   1. CAD s/p CABG. S/p rotational atherectomy and stenting of the RCA in July 2017. He is asymptomatic.  Continue medical therapy. Focus on lifestyle modification. He may hold Plavix 7 days for colonoscopy.    Not on BB due to h/o marked sinus bradycardia.  2. Mild AI - follow clinically. 3. HTN - controlled on present regimen. 4. Hyperlipidemia with chronicaly elevated LFTs - US shows a fatty liver. LFTs were  elevated but stable on present regimen. Labs followed by primary care.  5. Thrombocytopenia - no bleeding  Disposition: F/u with me in 6 months.    Signed, Mecca Guitron Martinique MD, [facility]  01/06/2018 1:44 PM    [facility]

## 2018-01-06 ENCOUNTER — Ambulatory Visit: Payer: Medicare Other | Admitting: Cardiology

## 2018-01-06 ENCOUNTER — Encounter: Payer: Self-pay | Admitting: Cardiology

## 2018-01-06 VITALS — BP 142/82 | HR 74 | Ht 74.0 in | Wt 216.4 lb

## 2018-01-06 DIAGNOSIS — E7800 Pure hypercholesterolemia, unspecified: Secondary | ICD-10-CM | POA: Diagnosis not present

## 2018-01-06 DIAGNOSIS — I2510 Atherosclerotic heart disease of native coronary artery without angina pectoris: Secondary | ICD-10-CM | POA: Diagnosis not present

## 2018-01-06 DIAGNOSIS — Z951 Presence of aortocoronary bypass graft: Secondary | ICD-10-CM

## 2018-01-06 DIAGNOSIS — I10 Essential (primary) hypertension: Secondary | ICD-10-CM

## 2018-01-06 NOTE — Patient Instructions (Addendum)
Continue your current therapy  You can hold Plavix for a week prior to colonoscopy  I will see you in 6 months

## 2018-03-01 ENCOUNTER — Other Ambulatory Visit: Payer: Self-pay | Admitting: Cardiology

## 2018-03-01 NOTE — Telephone Encounter (Signed)
REFILL 

## 2018-06-07 ENCOUNTER — Other Ambulatory Visit: Payer: Self-pay | Admitting: Cardiology

## 2018-07-15 NOTE — Progress Notes (Signed)
Cardiology Office Note    Date:  07/18/2018  ID:  Miguel Vasquez, DOB 23-Mar-1946, MRN [id number] PCP:  Coy Saunas, MD  Cardiologist:  Nolan Lasser Martinique MD  Chief Complaint: f/u CAD  History of Present Illness:  Miguel Vasquez is a 72 y.o. male with history of CAD (stent to Cx 1996, stent to RCA 2002, s/p CABGx3 in 2015, rotational atherectomy/DESx2 to RCA 05/2017), HLD (intolerant of statins due to elevated liver/muscle enzymes), HTN, DM, hemochromatosis, cervical disc disease, panic disorder, hypothyroidism, h/o marked sinus bradycardia (BB stopped) who presents for f/u CAD.  Last 2D Echo 09/2014: mild-mod LVH, EF 55-60%, cannot exclude RWMA, mild AI. This past summer he was complaining of atypical chest pain but this was similar to what prompted CABG. Nuc was abnormal so he underwent diagnostic cath 05/11/16 showing severe, diffuse calcific disease and a very large dominant RCA - essentially, chronic occlusion at the ostium. There were brisk left to right collaterals. He had occluded SVG-OM. 50% mLAD, patent LIMA-LAD. LVEF and LVEDP were normal. He was brought back for staged PCI with successful rotational atherectomy and stenting of the ostial to mid RCA with DES x 2. DAPT was recommended indefinitely.  We had also checked an echo 2/50/53 for systolic murmur showing mod LVH, EF 60-65%, grade 1 DD, mild AI.  On follow up today he is doing very well. He denies any chest pain or SOB. He walks 2 days a week and plays golf 3 days a week. Knows he could be eating better. Had lab work with primary care. He is not a candidate for statins due to elevated LFTs. He is on Zetia but has persistently high LDL.    Past Medical History:  Diagnosis Date  . Aortic insufficiency    a. Mild AI by echo 04/2016.  [name] Basal cell carcinoma, face   . Coronary disease    a. stent to Cx 1996. b. stent to RCA 2002. c. s/p CABGx3 in 2015. d.  rotational atherectomy and DESx2 to ostial to mid RCA.  [name] Elevated liver enzymes   .  GERD (gastroesophageal reflux disease)   . H. pylori infection tx'd in the 1970s  . Hemochromatosis   . History of gout 1990s X 1  . Hypercholesterolemia   . Hypertension    Controlled  . Hypothyroid   . Myocardial infarction [facility]) 2002   "mild"  . Obesity   . Panic disorder    Hx of panic disorder  . S/P primary angioplasty with coronary stent and rotational atherectomy of ostial mRCA with DES X 2-- 7/11/7 05/20/2016  . Sinus bradycardia    a. h/o marked sinus bradycardia, BB stopped.  . Tachycardia    a. Noted on stress test strips in 2017. ? Atrial flutter. Further w/u underway.  . Thrombocytopenia ([place])    a. per review of labs.  . Type II diabetes mellitus ([place])     Past Surgical History:  Procedure Laterality Date  . BASAL CELL CARCINOMA EXCISION     "face & lips"  . CARDIAC CATHETERIZATION  11//2015  . CARDIAC CATHETERIZATION N/A 05/11/2016   Procedure: Left Heart Cath and Cors/Grafts Angiography;  Surgeon: Jettie Booze, MD;  Location: [place] [facility];  Service: Cardiovascular;  Laterality: N/A;  . CARDIAC CATHETERIZATION N/A 05/19/2016   Procedure: Coronary/Graft Atherectomy;  Surgeon: Jilliam Bellmore M Martinique, MD;  Location: [place] CV LAB;  Service: Cardiovascular;  Laterality: N/A;  . CARDIAC CATHETERIZATION N/A 05/19/2016   Procedure: Coronary Stent  Intervention;  Surgeon: Rapheal Masso M Martinique, MD;  Location: [place] CV LAB;  Service: Cardiovascular;  Laterality: N/A;  . CATARACT EXTRACTION W/ INTRAOCULAR LENS  IMPLANT, BILATERAL Bilateral 2016  . CORONARY ANGIOPLASTY WITH STENT PLACEMENT  1996; 2002; 05/19/2016   "1 + 2 +2"  . CORONARY ARTERY BYPASS GRAFT N/A 10/03/2014   Procedure: CORONARY ARTERY BYPASS GRAFTING (CABG) x  three, using left internal mammary artery and right leg greater saphenous vein harvested endoscopically;  Surgeon: Melrose Nakayama, MD;  Location: [place];  Service: Open Heart Surgery;  Laterality: N/A;  . LEFT HEART CATHETERIZATION WITH  CORONARY ANGIOGRAM N/A 09/28/2014   Procedure: LEFT HEART CATHETERIZATION WITH CORONARY ANGIOGRAM;  Surgeon: Troy Sine, MD;  [facility];  Service: Cardiovascular;  Laterality: N/A;  . TEE WITHOUT CARDIOVERSION N/A 10/03/2014   Procedure: TRANSESOPHAGEAL ECHOCARDIOGRAM (TEE);  Surgeon: Melrose Nakayama, MD;  Location: [place];  Service: Open Heart Surgery;  Laterality: N/A;  . TONSILLECTOMY  1950s  . TRIGGER FINGER RELEASE Bilateral 1990s-2000s    Current Medications: Current Outpatient Medications  Medication Sig Dispense Refill  . allopurinol (ZYLOPRIM) 300 MG tablet Take 150 mg by mouth daily.     [name] aspirin 81 MG tablet Take 81 mg by mouth daily.    . clopidogrel (PLAVIX) 75 MG tablet TAKE 1 TABLET BY MOUTH ONCE DAILY 90 tablet 2  . ezetimibe (ZETIA) 10 MG tablet Take 10 mg by mouth daily.      [name] FOLIC ACID PO Take 616 mg by mouth daily.     [name] levothyroxine (SYNTHROID, LEVOTHROID) 100 MCG tablet Take 100 mcg by mouth daily.      . metFORMIN (GLUCOPHAGE) 500 MG tablet Take 1 tablet (500 mg total) by mouth 2 (two) times daily with a meal.    . nitroGLYCERIN (NITROSTAT) 0.4 MG SL tablet Place 0.4 mg under the tongue every 5 (five) minutes as needed for chest pain. DISSOLVE ONE TABLET UNDER THE TONGUE EVERY 5 MINUTES AS NEEDED FOR CHEST PAIN.  DO NOT EXCEED A TOTAL OF 3 DOSES IN 15 MINUTES    . omega-3 acid ethyl esters (LOVAZA) 1 g capsule TAKE 2 CAPSULES BY MOUTH TWICE DAILY 360 capsule 1  . pantoprazole (PROTONIX) 40 MG tablet Take 1 tablet (40 mg total) by mouth daily. 30 tablet 11  . PARoxetine (PAXIL) 20 MG tablet Take 20 mg by mouth daily.      . ramipril (ALTACE) 10 MG capsule Take 10 mg by mouth daily.     No current facility-administered medications for this visit.      Allergies:   Statins   Social History   Socioeconomic History  . Marital status: Married    Spouse name: Not on file  . Number of children: 1  . Years of education: Not on file  . Highest  education level: Not on file  Occupational History  . Occupation: Agricultural consultant [employer]  Social Needs  . Financial resource strain: Not on file  . Food insecurity:    Worry: Not on file    Inability: Not on file  . Transportation needs:    Medical: Not on file    Non-medical: Not on file  Tobacco Use  . Smoking status: Former Smoker    Packs/day: 1.00    Years: 20.00    Pack years: 20.00    Types: Cigarettes    Start date: 09/20/1995    Last attempt to quit: 09/24/1995    Years since quitting:  22.8  . Smokeless tobacco: Never Used  Substance and Sexual Activity  . Alcohol use: Yes    Alcohol/week: 6.0 standard drinks    Types: 6 Cans of beer per week  . Drug use: No  . Sexual activity: Yes  Lifestyle  . Physical activity:    Days per week: Not on file    Minutes per session: Not on file  . Stress: Not on file  Relationships  . Social connections:    Talks on phone: Not on file    Gets together: Not on file    Attends religious service: Not on file    Active member of club or organization: Not on file    Attends meetings of clubs or organizations: Not on file    Relationship status: Not on file  Other Topics Concern  . Not on file  Social History Narrative  . Not on file     Family History:  The patient's family history includes Colon cancer in his brother and mother; GI problems in his father; Heart attack in his sister; Heart failure in his mother.   ROS:   Please see the history of present illness.   All other systems are reviewed and otherwise negative.    PHYSICAL EXAM:   VS:  BP (!) 160/92   Pulse (!) 59   Ht 6\' 2"  (1.88 m)   Wt 217 lb 6.4 oz (98.6 kg)   BMI 27.91 kg/m   BMI: Body mass index is 27.91 kg/m. GENERAL:  Well appearing WM in NAD HEENT:  PERRL, EOMI, sclera are clear. Oropharynx is clear. NECK:  No jugular venous distention, carotid upstroke brisk and symmetric, no bruits, no thyromegaly or adenopathy LUNGS:  Clear to auscultation  bilaterally CHEST:  Unremarkable HEART:  RRR,  PMI not displaced or sustained,S1 and S2 within normal limits, no S3, no S4: no clicks, no rubs, no murmurs ABD:  Soft, nontender. BS +, no masses or bruits. No hepatomegaly, no splenomegaly EXT:  2 + pulses throughout, no edema, no cyanosis no clubbing SKIN:  Warm and dry.  No rashes NEURO:  Alert and oriented x 3. Cranial nerves II through XII intact. PSYCH:  Cognitively intact   Wt Readings from Last 3 Encounters:  07/18/18 217 lb 6.4 oz (98.6 kg)  01/06/18 216 lb 6.4 oz (98.2 kg)  04/29/17 209 lb (94.8 kg)      Studies/Labs Reviewed:   EKG:   EKG was  ordered today. Sinus brady rate 59. Normal. I have personally reviewed and interpreted this study.   Recent Labs: No results found for requested labs within last 8760 hours.   Lipid Panel    Component Value Date/Time   CHOL 250 (H) 05/07/2016 0736   TRIG 218 (H) 05/07/2016 0736   HDL 50 05/07/2016 0736   CHOLHDL 5.0 05/07/2016 0736   VLDL 44 (H) 05/07/2016 0736   LDLCALC 156 (H) 05/07/2016 0736    Additional studies/ records that were reviewed today include:  Labs dated 02/16/17: cholesterol 266, triglycerides 253, HDL 46, LDL 169. CBC normal except platelets 129K.  Dated 02/24/17: A1c 7.3%. Iron studies and TFTs normal. AST 90, ALT 128. Other chemistries are normal.  Dated 10/12/17: A1c 6%. Hgb normal. Potassium 5.2. AST 60. TSH normal. Triglycerides 98, HDL 58, LDL 161.  ASSESSMENT & PLAN:   1. CAD s/p CABG. S/p rotational atherectomy and stenting of the RCA in July 2017. He is asymptomatic.  Continue medical therapy. Focus on lifestyle modification. He  may hold Plavix 7 days for colonoscopy.    Not on BB due to h/o marked sinus bradycardia.  2. Mild AI - follow clinically. 3. HTN - controlled on present regimen. 4. Hyperlipidemia with chronicaly elevated LFTs - US shows a fatty liver. Labs followed by primary care and we will request a copy. LDL still high on Zetia. We  need to relook at starting a PCSK 9 agent. We considered this 3 years ago but cost was too high. Will have him see our PharmD to see if we get him on Repatha. 5. Thrombocytopenia - no bleeding  Disposition: F/u with me in 6 months.    Signed, Gracilyn Gunia Martinique MD, [facility]  07/18/2018 11:40 AM    [place]

## 2018-07-18 ENCOUNTER — Ambulatory Visit: Payer: Medicare Other | Admitting: Cardiology

## 2018-07-18 ENCOUNTER — Encounter: Payer: Self-pay | Admitting: Cardiology

## 2018-07-18 VITALS — BP 160/92 | HR 59 | Ht 74.0 in | Wt 217.4 lb

## 2018-07-18 DIAGNOSIS — I10 Essential (primary) hypertension: Secondary | ICD-10-CM | POA: Diagnosis not present

## 2018-07-18 DIAGNOSIS — E7800 Pure hypercholesterolemia, unspecified: Secondary | ICD-10-CM

## 2018-07-18 DIAGNOSIS — Z951 Presence of aortocoronary bypass graft: Secondary | ICD-10-CM

## 2018-07-18 DIAGNOSIS — I2510 Atherosclerotic heart disease of native coronary artery without angina pectoris: Secondary | ICD-10-CM | POA: Diagnosis not present

## 2018-07-18 NOTE — Patient Instructions (Signed)
Schedule appointment with Pharmacist for consideration of [place] physician wants you to follow-up in 6 months. You will receive a reminder letter in the mail two months in advance. If you don't receive a letter, please call our office to schedule the follow-up appointment.

## 2018-07-28 ENCOUNTER — Ambulatory Visit (INDEPENDENT_AMBULATORY_CARE_PROVIDER_SITE_OTHER): Payer: Medicare Other | Admitting: Pharmacist

## 2018-07-28 DIAGNOSIS — E7800 Pure hypercholesterolemia, unspecified: Secondary | ICD-10-CM

## 2018-07-28 NOTE — Patient Instructions (Signed)
Lipid Clinic (pharmacy) Corrinne Benegas/Kristin [phone]  *Work on lifestyle modification* *Call back to okay start paperwork for Repatha/Praluent *   Cholesterol Cholesterol is a fat. Your body needs a small amount of cholesterol. Cholesterol (plaque) may build up in your blood vessels (arteries). That makes you more likely to have a heart attack or stroke. You cannot feel your cholesterol level. Having a blood test is the only way to find out if your level is high. Keep your test results. Work with your doctor to keep your cholesterol at a good level. What do the results mean?  Total cholesterol is how much cholesterol is in your blood.  LDL is bad cholesterol. This is the type that can build up. Try to have low LDL.  HDL is good cholesterol. It cleans your blood vessels and carries LDL away. Try to have high HDL.  Triglycerides are fat that the body can store or burn for energy. What are good levels of cholesterol?  Total cholesterol below 200.  LDL below 100 is good for people who have health risks. LDL below 70 is good for people who have very high risks.  HDL above 40 is good. It is best to have HDL of 60 or higher.  Triglycerides below 150. How can I lower my cholesterol? Diet Follow your diet program as told by your doctor.  Choose fish, white meat chicken, or [place] that is roasted or baked. Try not to eat red meat, fried foods, sausage, or lunch meats.  Eat lots of fresh fruits and vegetables.  Choose whole grains, beans, pasta, potatoes, and cereals.  Choose olive oil, corn oil, or canola oil. Only use small amounts.  Try not to eat butter, mayonnaise, shortening, or palm kernel oils.  Try not to eat foods with trans fats.  Choose low-fat or nonfat dairy foods. ? Drink skim or nonfat milk. ? Eat low-fat or nonfat yogurt and cheeses. ? Try not to drink whole milk or cream. ? Try not to eat ice cream, egg yolks, or full-fat cheeses.  Healthy desserts include  angel food cake, ginger snaps, animal crackers, hard candy, popsicles, and low-fat or nonfat frozen yogurt. Try not to eat pastries, cakes, pies, and cookies.  Exercise Follow your exercise program as told by your doctor.  Be more active. Try gardening, walking, and taking the stairs.  Ask your doctor about ways that you can be more active.  Medicine  Take over-the-counter and prescription medicines only as told by your doctor. This information is not intended to replace advice given to you by your health care provider. Make sure you discuss any questions you have with your health care provider. Document Released: 01/22/2009 Document Revised: 05/27/2016 Document Reviewed: 05/07/2016 Elsevier Interactive Patient Education  [employer].

## 2018-07-28 NOTE — Progress Notes (Signed)
Patient ID: Miguel Vasquez                 DOB: 1946/07/19                    MRN: [id number]     HPI: Miguel Vasquez is a 72 y.o. male patient referred to lipid clinic by Dr Martinique. PMH is significant for  DM-ii, CAD s/p stent placement in 1996 and 202, CABG x 2015, hypertension, elevated LFTs, and hyperthyroidism.  Noted intolerance to statins with failed trials including atorvastatin 20mg  daily, and pravastatin 20mg . Patient reports elevated LFTs while taking statins and elevation at baseline due to fatty liver.  Patient can not tolerate statins and refuses re-challenge with rosuvastatin. He presents to clinic for potential PCSK9i initiation and denies problems with current therapy.    Current Medications:  ezetimibe 10 mg daily Lovaza 2 g twice dialy  Intolerance:  Atorvastatin 10mg  Pravastatin 20mg  Cholestyramine 4gm   Risk Factors: CAD w/ multiple stents placed and CABGx3, MI, T2DM  LDL goal: <70  Diet: working on diet modification. Still eating out frequently and drinking beer every day. Reports frequent intake of cheese, red meat, hot-dog and BBQ pork.   Exercise: golf and walks 2-3x per week  Family History: HF in mother, MI in sister, colon cancer in mother and brother.  Social History: Former smoker with 20 pack years, moderate alcohol use  Labs:  Sept/03/2018: CHO 251; TG 224; HDL 50; LDL-c 156 (ezetimibe and lovanza)  Past Medical History:  Diagnosis Date  . Aortic insufficiency    a. Mild AI by echo 04/2016.  [name] Basal cell carcinoma, face   . Coronary disease    a. stent to Cx 1996. b. stent to RCA 2002. c. s/p CABGx3 in 2015. d.  rotational atherectomy and DESx2 to ostial to mid RCA.  [name] Elevated liver enzymes   . GERD (gastroesophageal reflux disease)   . H. pylori infection tx'd in the 1970s  . Hemochromatosis   . History of gout 1990s X 1  . Hypercholesterolemia   . Hypertension    Controlled  . Hypothyroid   . Myocardial infarction [facility]) 2002   "mild"  .  Obesity   . Panic disorder    Hx of panic disorder  . S/P primary angioplasty with coronary stent and rotational atherectomy of ostial mRCA with DES X 2-- 7/11/7 05/20/2016  . Sinus bradycardia    a. h/o marked sinus bradycardia, BB stopped.  . Tachycardia    a. Noted on stress test strips in 2017. ? Atrial flutter. Further w/u underway.  . Thrombocytopenia ([place])    a. per review of labs.  . Type II diabetes mellitus ([place])     Current Outpatient Medications on File Prior to Visit  Medication Sig Dispense Refill  . allopurinol (ZYLOPRIM) 300 MG tablet Take 150 mg by mouth daily.     [name] aspirin 81 MG tablet Take 81 mg by mouth daily.    . clopidogrel (PLAVIX) 75 MG tablet TAKE 1 TABLET BY MOUTH ONCE DAILY 90 tablet 2  . ezetimibe (ZETIA) 10 MG tablet Take 10 mg by mouth daily.      [name] FOLIC ACID PO Take 401 mg by mouth daily.     [name] levothyroxine (SYNTHROID, LEVOTHROID) 100 MCG tablet Take 100 mcg by mouth daily.      . metFORMIN (GLUCOPHAGE) 500 MG tablet Take 1 tablet (500 mg total) by mouth 2 (two) times daily with a meal.    .  nitroGLYCERIN (NITROSTAT) 0.4 MG SL tablet Place 0.4 mg under the tongue every 5 (five) minutes as needed for chest pain. DISSOLVE ONE TABLET UNDER THE TONGUE EVERY 5 MINUTES AS NEEDED FOR CHEST PAIN.  DO NOT EXCEED A TOTAL OF 3 DOSES IN 15 MINUTES    . omega-3 acid ethyl esters (LOVAZA) 1 g capsule TAKE 2 CAPSULES BY MOUTH TWICE DAILY 360 capsule 1  . pantoprazole (PROTONIX) 40 MG tablet Take 1 tablet (40 mg total) by mouth daily. 30 tablet 11  . PARoxetine (PAXIL) 20 MG tablet Take 20 mg by mouth daily.      . ramipril (ALTACE) 10 MG capsule Take 10 mg by mouth daily.     No current facility-administered medications on file prior to visit.     Allergies  Allergen Reactions  . Statins Other (See Comments)    "Affects liver enzymes"    Hypercholesterolemia LDL and TG remains extremely elevated for secondary prevention. Patient continues to work on lifestyle  modification but his alcohol intake remains unchanged. Big part of counseling was spent talking about positive lifestyle modifications.  Patient is a good candidate for PCSK9i. Repatha/Praleunt indication, MOA, common side effects, administration, storage, PA process,and patient assistance program, were discussed during this appointment.   Mr Fiorito will like to discuss option with wife and call us back to let me know if okay to start PA process.    Delena Casebeer Rodriguez-Guzman PharmD, BCPS, [place] [phone] 07/29/2018 2:20 PM

## 2018-07-29 ENCOUNTER — Encounter: Payer: Self-pay | Admitting: Pharmacist

## 2018-07-29 NOTE — Assessment & Plan Note (Signed)
LDL and TG remains extremely elevated for secondary prevention. Patient continues to work on lifestyle modification but his alcohol intake remains unchanged. Big part of counseling was spent talking about positive lifestyle modifications.  Patient is a good candidate for PCSK9i. Repatha/Praleunt indication, MOA, common side effects, administration, storage, PA process,and patient assistance program, were discussed during this appointment.   Miguel Vasquez will like to discuss option with wife and call us back to let me know if okay to start PA process.

## 2018-08-30 ENCOUNTER — Other Ambulatory Visit: Payer: Self-pay | Admitting: Cardiology

## 2019-02-01 ENCOUNTER — Telehealth: Payer: Self-pay

## 2019-02-01 NOTE — Telephone Encounter (Signed)
   Primary Cardiologist:  Dr.Jordan  Patient contacted.  History reviewed.  No symptoms to suggest any unstable cardiac conditions.  Based on discussion, with current pandemic situation, we will be postponing this 02/08/19 appointment for Daryll Brod with a plan for f/u 04/21/19.  If symptoms change, he has been instructed to contact our office.     Kathyrn Lass, LPN  1/58/6825 7:49 PM         .

## 2019-02-08 ENCOUNTER — Ambulatory Visit: Payer: Medicare Other | Admitting: Cardiology

## 2019-03-07 ENCOUNTER — Other Ambulatory Visit: Payer: Self-pay | Admitting: Cardiology

## 2019-03-07 NOTE — Telephone Encounter (Signed)
Clopidogrel refilled

## 2019-04-17 ENCOUNTER — Telehealth: Payer: Self-pay | Admitting: Cardiology

## 2019-04-17 NOTE — Telephone Encounter (Signed)
Patient called and returned a call from our office about registration. He states his insurance is still the same, and to call if further verification is still needed

## 2019-04-17 NOTE — Telephone Encounter (Signed)
call home phone/ Patient is open to Video on computer as long as someone helps with the set up/ consent/ my chart/ pre reg completed

## 2019-04-18 NOTE — Progress Notes (Signed)
Virtual Visit via Video Note   This visit type was conducted due to national recommendations for restrictions regarding the COVID-19 Pandemic (e.g. social distancing) in an effort to limit this patient's exposure and mitigate transmission in our community.  Due to his co-morbid illnesses, this patient is at least at moderate risk for complications without adequate follow up.  This format is felt to be most appropriate for this patient at this time.  All issues noted in this document were discussed and addressed.  A limited physical exam was performed with this format.  Please refer to the patient's chart for his consent to telehealth for [facility].   Date:  04/21/2019   ID:  Miguel Vasquez, DOB [birth year], MRN [id number]  Patient Location: Home Provider Location: Home  PCP:  Chriss Czar, MD  Cardiologist:  Idali Lafever [place]ique MD Electrophysiologist:  None   Evaluation Performed:  Follow-Up Visit  Chief Complaint:  Follow up CAD  History of Present Illness:    Miguel Vasquez is a 73 y.o. male with history of CAD (stent to Cx 1996, stent to RCA 2002, s/p CABGx3 in 2015, rotational atherectomy/DESx2 to RCA 05/2017), HLD (intolerant of statins due to elevated liver/muscle enzymes), HTN, DM, hemochromatosis, cervical disc disease, panic disorder, hypothyroidism, h/o marked sinus bradycardia (BB stopped) who presents for f/u CAD.  Last 2D Echo 09/2014: mild-mod LVH, EF 55-60%, cannot exclude RWMA, mild AI. This past summer he was complaining of atypical chest pain but this was similar to what prompted CABG. Nuc was abnormal so he underwent diagnostic cath 05/11/16 showing severe, diffuse calcific disease and a very large dominant RCA - essentially, chronic occlusion at the ostium. There were brisk left to right collaterals. He had occluded SVG-OM. 50% mLAD, patent LIMA-LAD. LVEF and LVEDP were normal. He was brought back for staged PCI with successful rotational atherectomy and stenting of the ostial  to mid RCA with DES x 2. DAPT was recommended indefinitely.  We had also checked an echo 8/76/81 for systolic murmur showing mod LVH, EF 60-65%, grade 1 DD, mild AI. He has been intolerant of statins. He was seen by our pharmacist in September for consideration of a PCSK 9 inhibitor. He wanted to discuss with his wife.   On follow up today he is doing well. Denies any chest pain or SOB. Activity limited by pandemic but still walking and playing a little golf. Working on his diet. Wants to wait until he has repeat labs in July before trying any new medication.   The patient does not have symptoms concerning for COVID-19 infection (fever, chills, cough, or new shortness of breath).    Past Medical History:  Diagnosis Date   Aortic insufficiency    a. Mild AI by echo 04/2016.   Basal cell carcinoma, face    Coronary disease    a. stent to Cx 1996. b. stent to RCA 2002. c. s/p CABGx3 in 2015. d.  rotational atherectomy and DESx2 to ostial to mid RCA.   Elevated liver enzymes    GERD (gastroesophageal reflux disease)    H. pylori infection tx'd in the 1970s   Hemochromatosis    History of gout 1990s X 1   Hypercholesterolemia    Hypertension    Controlled   Hypothyroid    Myocardial infarction [facility]) 2002   "mild"   Obesity    Panic disorder    Hx of panic disorder   S/P primary angioplasty with coronary stent and rotational atherectomy of ostial mRCA with  DES X 2-- 7/11/7 05/20/2016   Sinus bradycardia    a. h/o marked sinus bradycardia, BB stopped.   Tachycardia    a. Noted on stress test strips in 2017. ? Atrial flutter. Further w/u underway.   Thrombocytopenia ([place])    a. per review of labs.   Type II diabetes mellitus [facility])    Past Surgical History:  Procedure Laterality Date   BASAL CELL CARCINOMA EXCISION     "face & lips"   CARDIAC CATHETERIZATION  11//2015   CARDIAC CATHETERIZATION N/A 05/11/2016   Procedure: Left Heart Cath and Cors/Grafts Angiography;   Surgeon: Jettie Booze, MD;  Location: [place] CV [facility];  Service: Cardiovascular;  Laterality: N/A;   CARDIAC CATHETERIZATION N/A 05/19/2016   Procedure: Coronary/Graft Atherectomy;  Surgeon: Kierra Jezewski M Martinique, MD;  Location: [place] CV [facility];  Service: Cardiovascular;  Laterality: N/A;   CARDIAC CATHETERIZATION N/A 05/19/2016   Procedure: Coronary Stent Intervention;  Surgeon: Zakk Borgen M Martinique, MD;  Location: [place] CV [facility];  Service: Cardiovascular;  Laterality: N/A;   CATARACT EXTRACTION W/ INTRAOCULAR LENS  IMPLANT, BILATERAL Bilateral 2016   CORONARY ANGIOPLASTY WITH STENT PLACEMENT  1996; 2002; 05/19/2016   "1 + 2 +2"   CORONARY ARTERY BYPASS GRAFT N/A 10/03/2014   Procedure: CORONARY ARTERY BYPASS GRAFTING (CABG) x  three, using left internal mammary artery and right leg greater saphenous vein harvested endoscopically;  Surgeon: Melrose Nakayama, MD;  Location: [place];  Service: Open Heart Surgery;  Laterality: N/A;   LEFT HEART CATHETERIZATION WITH CORONARY ANGIOGRAM N/A 09/28/2014   Procedure: LEFT HEART CATHETERIZATION WITH CORONARY ANGIOGRAM;  Surgeon: Troy Sine, MD;  [facility];  Service: Cardiovascular;  Laterality: N/A;   TEE WITHOUT CARDIOVERSION N/A 10/03/2014   Procedure: TRANSESOPHAGEAL ECHOCARDIOGRAM (TEE);  Surgeon: Melrose Nakayama, MD;  Location: [place];  Service: Open Heart Surgery;  Laterality: N/A;   TONSILLECTOMY  1950s   TRIGGER FINGER RELEASE Bilateral 1990s-2000s     Current Meds  Medication Sig   allopurinol (ZYLOPRIM) 300 MG tablet Take 150 mg by mouth daily.    aspirin 81 MG tablet Take 81 mg by mouth daily.   clopidogrel (PLAVIX) 75 MG tablet Take 1 tablet by mouth once daily   ezetimibe (ZETIA) 10 MG tablet Take 10 mg by mouth daily.     FOLIC ACID PO Take 341 mg by mouth daily.    levothyroxine (SYNTHROID, LEVOTHROID) 100 MCG tablet Take 100 mcg by mouth daily.     metFORMIN (GLUCOPHAGE) 500 MG tablet Take 1  tablet (500 mg total) by mouth 2 (two) times daily with a meal.   nitroGLYCERIN (NITROSTAT) 0.4 MG SL tablet Place 0.4 mg under the tongue every 5 (five) minutes as needed for chest pain. DISSOLVE ONE TABLET UNDER THE TONGUE EVERY 5 MINUTES AS NEEDED FOR CHEST PAIN.  DO NOT EXCEED A TOTAL OF 3 DOSES IN 15 MINUTES   omega-3 acid ethyl esters (LOVAZA) 1 g capsule TAKE 2 CAPSULES BY MOUTH TWICE DAILY   pantoprazole (PROTONIX) 40 MG tablet Take 1 tablet (40 mg total) by mouth daily.   PARoxetine (PAXIL) 20 MG tablet Take 20 mg by mouth daily.     ramipril (ALTACE) 10 MG capsule Take 10 mg by mouth daily.     Allergies:   Statins   Social History   Tobacco Use   Smoking status: Former Smoker    Packs/day: 1.00    Years: 20.00    Pack years:  20.00    Types: Cigarettes    Start date: 09/20/1995    Quit date: 09/24/1995    Years since quitting: 23.5   Smokeless tobacco: Never Used  Substance Use Topics   Alcohol use: Yes    Alcohol/week: 6.0 standard drinks    Types: 6 Cans of beer per week   Drug use: No     Family Hx: The patient's family history includes Colon cancer in his brother and mother; GI problems in his father; Heart attack in his sister; Heart failure in his mother.  ROS:   Please see the history of present illness.    All other systems reviewed and are negative.   Prior CV studies:   The following studies were reviewed today:  none  Labs/Other Tests and Data Reviewed:    EKG:  No ECG reviewed.  Recent Labs: No results found for requested labs within last 8760 hours.   Recent Lipid Panel Lab Results  Component Value Date/Time   CHOL 250 (H) 05/07/2016 07:36 AM   TRIG 218 (H) 05/07/2016 07:36 AM   HDL 50 05/07/2016 07:36 AM   CHOLHDL 5.0 05/07/2016 07:36 AM   LDLCALC 156 (H) 05/07/2016 07:36 AM    Wt Readings from Last 3 Encounters:  04/21/19 218 lb (98.9 kg)  07/18/18 217 lb 6.4 oz (98.6 kg)  01/06/18 216 lb 6.4 oz (98.2 kg)      Objective:    Vital Signs:  BP 131/77    Pulse 70    Temp 98.5 F (36.9 C)    Ht 6\' 2"  (1.88 m)    Wt 218 lb (98.9 kg)    SpO2 97%    BMI 27.99 kg/m    VITAL SIGNS:  reviewed  General: no distress HEENT: sclera are clear. Wears glasses Respirations unlabored Skin clear Neuro alert and oriented x 3. nonfocal Mood appropriate  ASSESSMENT & PLAN:    1. CAD s/p CABG. S/p rotational atherectomy and stenting of the RCA in July 2017. He is asymptomatic.  Not on BB due to h/o marked sinus bradycardia. Will continue current medical therapy 2. Mild AI - follow clinically. 3. HTN - controlled on present regimen. 4. Hyperlipidemia with chronicaly elevated LFTs - US shows a fatty liver. LDL still high on Zetia. We offered starting a PCSK 9 agent. He wants to hold off on this. He would also be a candidate for Bempidoic acid. We will await results of lab work in July.  5. Thrombocytopenia - chronic   COVID-19 Education: The signs and symptoms of COVID-19 were discussed with the patient and how to seek care for testing (follow up with PCP or arrange E-visit).  The importance of social distancing was discussed today.  Time:   Today, I have spent 15 minutes with the patient with telehealth technology discussing the above problems.     Medication Adjustments/Labs and Tests Ordered: Current medicines are reviewed at length with the patient today.  Concerns regarding medicines are outlined above.   Tests Ordered: No orders of the defined types were placed in this encounter.   Medication Changes: No orders of the defined types were placed in this encounter.   Disposition:  Follow up in 6 month(s)  Signed, Anju Sereno Martinique, MD  04/21/2019 9:57 AM    [facility]

## 2019-04-21 ENCOUNTER — Telehealth (INDEPENDENT_AMBULATORY_CARE_PROVIDER_SITE_OTHER): Payer: Medicare Other | Admitting: Cardiology

## 2019-04-21 VITALS — BP 131/77 | HR 70 | Temp 98.5°F | Ht 74.0 in | Wt 218.0 lb

## 2019-04-21 DIAGNOSIS — I10 Essential (primary) hypertension: Secondary | ICD-10-CM

## 2019-04-21 DIAGNOSIS — E7800 Pure hypercholesterolemia, unspecified: Secondary | ICD-10-CM

## 2019-04-21 DIAGNOSIS — I2510 Atherosclerotic heart disease of native coronary artery without angina pectoris: Secondary | ICD-10-CM

## 2019-04-21 DIAGNOSIS — Z951 Presence of aortocoronary bypass graft: Secondary | ICD-10-CM

## 2019-04-21 NOTE — Patient Instructions (Signed)

## 2019-08-21 ENCOUNTER — Other Ambulatory Visit: Payer: Self-pay | Admitting: Cardiology

## 2019-10-23 NOTE — Progress Notes (Signed)
Cardiology Office Note    Date:  10/26/2019  ID:  Miguel Vasquez, DOB 1946/08/05, MRN [id number] PCP:  Chriss Czar, MD  Cardiologist:  Eila Runyan Martinique MD  Chief Complaint: f/u CAD  History of Present Illness:  Miguel Vasquez is a 73 y.o. male with history of CAD (stent to Cx 1996, stent to RCA 2002, s/p CABGx3 in 2015, rotational atherectomy/DESx2 to RCA 05/2017), HLD (intolerant of statins due to elevated liver/muscle enzymes), HTN, DM, hemochromatosis, cervical disc disease, panic disorder, hypothyroidism, h/o marked sinus bradycardia (BB stopped) who presents for f/u CAD.  Last 2D Echo 09/2014: mild-mod LVH, EF 55-60%, cannot exclude RWMA, mild AI. This past summer he was complaining of atypical chest pain but this was similar to what prompted CABG. Nuc was abnormal so he underwent diagnostic cath 05/11/16 showing severe, diffuse calcific disease and a very large dominant RCA - essentially, chronic occlusion at the ostium. There were brisk left to right collaterals. He had occluded SVG-OM. 50% mLAD, patent LIMA-LAD. LVEF and LVEDP were normal. He was brought back for staged PCI with successful rotational atherectomy and stenting of the ostial to mid RCA with DES x 2. DAPT was recommended indefinitely.  We had also checked an echo 123456 for systolic murmur showing mod LVH, EF 60-65%, grade 1 DD, mild AI.  He denies any chest pain or SOB. He walks 2 days a week and plays golf regularly. He admits his diet is poor.  He is scheduled for lab work next month by primary care.  He is not a candidate for statins due to elevated LFTs. He is on Zetia but has persistently high LDL. He notes BP is always very high when he goes to the doctor. By his BP cuff at home BP average is 140/84.    Past Medical History:  Diagnosis Date  . Aortic insufficiency    a. Mild AI by echo 04/2016.  [name] Basal cell carcinoma, face   . Coronary disease    a. stent to Cx 1996. b. stent to RCA 2002. c. s/p CABGx3 in 2015. d.   rotational atherectomy and DESx2 to ostial to mid RCA.  [name] Elevated liver enzymes   . GERD (gastroesophageal reflux disease)   . H. pylori infection tx'd in the 1970s  . Hemochromatosis   . History of gout 1990s X 1  . Hypercholesterolemia   . Hypertension    Controlled  . Hypothyroid   . Myocardial infarction [facility]) 2002   "mild"  . Obesity   . Panic disorder    Hx of panic disorder  . S/P primary angioplasty with coronary stent and rotational atherectomy of ostial mRCA with DES X 2-- 7/11/7 05/20/2016  . Sinus bradycardia    a. h/o marked sinus bradycardia, BB stopped.  . Tachycardia    a. Noted on stress test strips in 2017. ? Atrial flutter. Further w/u underway.  . Thrombocytopenia ([place])    a. per review of labs.  . Type II diabetes mellitus ([place])     Past Surgical History:  Procedure Laterality Date  . BASAL CELL CARCINOMA EXCISION     "face & lips"  . CARDIAC CATHETERIZATION  11//2015  . CARDIAC CATHETERIZATION N/A 05/11/2016   Procedure: Left Heart Cath and Cors/Grafts Angiography;  Surgeon: Jettie Booze, MD;  Location: [place] CV [facility];  Service: Cardiovascular;  Laterality: N/A;  . CARDIAC CATHETERIZATION N/A 05/19/2016   Procedure: Coronary/Graft Atherectomy;  Surgeon: Elecia Serafin M Martinique, MD;  Location: [place] CV [facility];  Service: Cardiovascular;  Laterality: N/A;  . CARDIAC CATHETERIZATION N/A 05/19/2016   Procedure: Coronary Stent Intervention;  Surgeon: Idali Lafever M Martinique, MD;  Location: [place] CV [facility];  Service: Cardiovascular;  Laterality: N/A;  . CATARACT EXTRACTION W/ INTRAOCULAR LENS  IMPLANT, BILATERAL Bilateral 2016  . CORONARY ANGIOPLASTY WITH STENT PLACEMENT  1996; 2002; 05/19/2016   "1 + 2 +2"  . CORONARY ARTERY BYPASS GRAFT N/A 10/03/2014   Procedure: CORONARY ARTERY BYPASS GRAFTING (CABG) x  three, using left internal mammary artery and right leg greater saphenous vein harvested endoscopically;  Surgeon: Melrose Nakayama, MD;  Location: [place];   Service: Open Heart Surgery;  Laterality: N/A;  . LEFT HEART CATHETERIZATION WITH CORONARY ANGIOGRAM N/A 09/28/2014   Procedure: LEFT HEART CATHETERIZATION WITH CORONARY ANGIOGRAM;  Surgeon: Troy Sine, MD;  Location: [facility] CATH LAB;  Service: Cardiovascular;  Laterality: N/A;  . TEE WITHOUT CARDIOVERSION N/A 10/03/2014   Procedure: TRANSESOPHAGEAL ECHOCARDIOGRAM (TEE);  Surgeon: Melrose Nakayama, MD;  Location: [place];  Service: Open Heart Surgery;  Laterality: N/A;  . TONSILLECTOMY  1950s  . TRIGGER FINGER RELEASE Bilateral 1990s-2000s    Current Medications: Current Outpatient Medications  Medication Sig Dispense Refill  . allopurinol (ZYLOPRIM) 300 MG tablet Take 150 mg by mouth daily.     [name] aspirin 81 MG tablet Take 81 mg by mouth daily.    . clopidogrel (PLAVIX) 75 MG tablet Take 1 tablet by mouth once daily 90 tablet 0  . ezetimibe (ZETIA) 10 MG tablet Take 10 mg by mouth daily.      [name] FOLIC ACID PO Take [id number] mg by mouth daily.     [name] levothyroxine (SYNTHROID, LEVOTHROID) 100 MCG tablet Take 100 mcg by mouth daily.      . metFORMIN (GLUCOPHAGE) 500 MG tablet Take 1 tablet (500 mg total) by mouth 2 (two) times daily with a meal.    . nitroGLYCERIN (NITROSTAT) 0.4 MG SL tablet Place 0.4 mg under the tongue every 5 (five) minutes as needed for chest pain. DISSOLVE ONE TABLET UNDER THE TONGUE EVERY 5 MINUTES AS NEEDED FOR CHEST PAIN.  DO NOT EXCEED A TOTAL OF 3 DOSES IN 15 MINUTES    . omega-3 acid ethyl esters (LOVAZA) 1 g capsule Take 2 capsules by mouth twice daily 360 capsule 0  . pantoprazole (PROTONIX) 40 MG tablet Take 1 tablet (40 mg total) by mouth daily. 30 tablet 11  . PARoxetine (PAXIL) 20 MG tablet Take 20 mg by mouth daily.      . ramipril (ALTACE) 10 MG capsule Take 10 mg by mouth daily.     No current facility-administered medications for this visit.     Allergies:   Statins   Social History   Socioeconomic History  . Marital status: Married    Spouse name: Not  on file  . Number of children: 1  . Years of education: Not on file  . Highest education level: Not on file  Occupational History  . Occupation: Agricultural consultant [employer]  Tobacco Use  . Smoking status: Former Smoker    Packs/day: 1.00    Years: 20.00    Pack years: 20.00    Types: Cigarettes    Start date: 09/20/1995    Quit date: 09/24/1995    Years since quitting: 24.1  . Smokeless tobacco: Never Used  Substance and Sexual Activity  . Alcohol use: Yes    Alcohol/week: 6.0 standard drinks    Types: 6 Cans of beer per week  .  Drug use: No  . Sexual activity: Yes  Other Topics Concern  . Not on file  Social History Narrative  . Not on file   Social Determinants of Health   Financial Resource Strain:   . Difficulty of Paying Living Expenses: Not on file  Food Insecurity:   . Worried About Charity fundraiser in the Last Year: Not on file  . Ran Out of Food in the Last Year: Not on file  Transportation Needs:   . Lack of Transportation (Medical): Not on file  . Lack of Transportation (Non-Medical): Not on file  Physical Activity:   . Days of Exercise per Week: Not on file  . Minutes of Exercise per Session: Not on file  Stress:   . Feeling of Stress : Not on file  Social Connections:   . Frequency of Communication with Friends and Family: Not on file  . Frequency of Social Gatherings with Friends and Family: Not on file  . Attends Religious Services: Not on file  . Active Member of Clubs or Organizations: Not on file  . Attends Archivist Meetings: Not on file  . Marital Status: Not on file     Family History:  The patient's family history includes Colon cancer in his brother and mother; GI problems in his father; Heart attack in his sister; Heart failure in his mother.   ROS:   Please see the history of present illness.   All other systems are reviewed and otherwise negative.    PHYSICAL EXAM:   VS:  BP (!) 181/101   Pulse 69   Ht 6\' 1"  (1.854 m)   Wt 216 lb  (98 kg)   SpO2 98%   BMI 28.50 kg/m   BMI: Body mass index is 28.5 kg/m. GENERAL:  Well appearing WM in NAD HEENT:  PERRL, EOMI, sclera are clear. Oropharynx is clear. NECK:  No jugular venous distention, carotid upstroke brisk and symmetric, no bruits, no thyromegaly or adenopathy LUNGS:  Clear to auscultation bilaterally CHEST:  Unremarkable HEART:  RRR,  PMI not displaced or sustained,S1 and S2 within normal limits, no S3, no S4: no clicks, no rubs, no murmurs ABD:  Soft, nontender. BS +, no masses or bruits. No hepatomegaly, no splenomegaly EXT:  2 + pulses throughout, no edema, no cyanosis no clubbing SKIN:  Warm and dry.  No rashes NEURO:  Alert and oriented x 3. Cranial nerves II through XII intact. PSYCH:  Cognitively intact   Wt Readings from Last 3 Encounters:  10/26/19 216 lb (98 kg)  04/21/19 218 lb (98.9 kg)  07/18/18 217 lb 6.4 oz (98.6 kg)      Studies/Labs Reviewed:   EKG:   EKG was  ordered today. NSR rate 69. Nonspecific TWA..  I have personally reviewed and interpreted this study.   Recent Labs: No results found for requested labs within last 8760 hours.   Lipid Panel    Component Value Date/Time   CHOL 250 (H) 05/07/2016 0736   TRIG 218 (H) 05/07/2016 0736   HDL 50 05/07/2016 0736   CHOLHDL 5.0 05/07/2016 0736   VLDL 44 (H) 05/07/2016 0736   LDLCALC 156 (H) 05/07/2016 0736    Additional studies/ records that were reviewed today include:  Labs dated 02/16/17: cholesterol 266, triglycerides 253, HDL 46, LDL 169. CBC normal except platelets 129K.  Dated 02/24/17: A1c 7.3%. Iron studies and TFTs normal. AST 90, ALT 128. Other chemistries are normal.  Dated 10/12/17: A1c 6%.  Hgb normal. Potassium 5.2. AST 60. TSH normal. Triglycerides 98, HDL 58, LDL 161.  ASSESSMENT & PLAN:   1. CAD s/p CABG. S/p rotational atherectomy and stenting of the RCA in July 2017. He is asymptomatic.  Continue medical therapy. Focus on lifestyle modification. Not on BB due to  h/o marked sinus bradycardia.  2. Mild AI - follow clinically. 3. HTN - marginal control on current therapy. Definitely has component of white coat syndrome. 4. Hyperlipidemia with chronicaly elevated LFTs - US shows a fatty liver.  LDL still high on Zetia. I really think we should put him on a PCSK 9 agent. We discussed a very low dose statin but this may be difficult due to elevated LFTs.  5. Thrombocytopenia - no bleeding  Disposition: F/u with me in 6 months.    Signed, Jackey Housey Martin[place] MD, [facility]  10/26/2019 11:18 AM    [place]

## 2019-10-26 ENCOUNTER — Ambulatory Visit: Payer: Medicare Other | Admitting: Cardiology

## 2019-10-26 ENCOUNTER — Other Ambulatory Visit: Payer: Self-pay

## 2019-10-26 ENCOUNTER — Encounter: Payer: Self-pay | Admitting: Cardiology

## 2019-10-26 VITALS — BP 181/101 | HR 69 | Ht 73.0 in | Wt 216.0 lb

## 2019-10-26 DIAGNOSIS — I2510 Atherosclerotic heart disease of native coronary artery without angina pectoris: Secondary | ICD-10-CM

## 2019-10-26 DIAGNOSIS — I10 Essential (primary) hypertension: Secondary | ICD-10-CM

## 2019-10-26 DIAGNOSIS — E7800 Pure hypercholesterolemia, unspecified: Secondary | ICD-10-CM

## 2019-10-26 DIAGNOSIS — Z951 Presence of aortocoronary bypass graft: Secondary | ICD-10-CM | POA: Diagnosis not present

## 2019-11-22 ENCOUNTER — Other Ambulatory Visit: Payer: Self-pay | Admitting: Cardiology

## 2020-05-03 NOTE — Progress Notes (Signed)
Cardiology Office Note    Date:  05/06/2020  ID:  Miguel Vasquez, DOB 1946-10-15, MRN [id number] PCP:  Chriss Czar, MD  Cardiologist:  Narya Beavin [place] MD  Chief Complaint: f/u CAD  History of Present Illness:  Miguel Vasquez is a 74 y.o. male with history of CAD (stent to Cx 1996, stent to RCA 2002, s/p CABGx3 in 2015, rotational atherectomy/DESx2 to RCA 05/2017), HLD (intolerant of statins due to elevated liver/muscle enzymes), HTN, DM, hemochromatosis, cervical disc disease, panic disorder, hypothyroidism, h/o marked sinus bradycardia (BB stopped) who presents for f/u CAD.  Last 2D Echo 09/2014: mild-mod LVH, EF 55-60%, cannot exclude RWMA, mild AI. This past summer he was complaining of atypical chest pain but this was similar to what prompted CABG. Nuc was abnormal so he underwent diagnostic cath 05/11/16 showing severe, diffuse calcific disease and a very large dominant RCA - essentially, chronic occlusion at the ostium. There were brisk left to right collaterals. He had occluded SVG-OM. 50% mLAD, patent LIMA-LAD. LVEF and LVEDP were normal. He was brought back for staged PCI with successful rotational atherectomy and stenting of the ostial to mid RCA with DES x 2. DAPT was recommended indefinitely.  We had also checked an echo 5/46/56 for systolic murmur showing mod LVH, EF 60-65%, grade 1 DD, mild AI.  On follow up today he denies any chest pain or SOB. He is not playing as much golf because his back has stiffened up on him. he is still active doing yard work.  He is trying to eat better but has a weakness for bread.   He is not a candidate for statins due to elevated LFTs. He is on Zetia but has persistently high LDL.    Past Medical History:  Diagnosis Date  . Aortic insufficiency    a. Mild AI by echo 04/2016.  [name] Basal cell carcinoma, face   . Coronary disease    a. stent to Cx 1996. b. stent to RCA 2002. c. s/p CABGx3 in 2015. d.  rotational atherectomy and DESx2 to ostial to mid RCA.   [name] Elevated liver enzymes   . GERD (gastroesophageal reflux disease)   . H. pylori infection tx'd in the 1970s  . Hemochromatosis   . History of gout 1990s X 1  . Hypercholesterolemia   . Hypertension    Controlled  . Hypothyroid   . Myocardial infarction [facility]) 2002   "mild"  . Obesity   . Panic disorder    Hx of panic disorder  . S/P primary angioplasty with coronary stent and rotational atherectomy of ostial mRCA with DES X 2-- 7/11/7 05/20/2016  . Sinus bradycardia    a. h/o marked sinus bradycardia, BB stopped.  . Tachycardia    a. Noted on stress test strips in 2017. ? Atrial flutter. Further w/u underway.  . Thrombocytopenia ([place])    a. per review of labs.  . Type II diabetes mellitus ([place])     Past Surgical History:  Procedure Laterality Date  . BASAL CELL CARCINOMA EXCISION     "face & lips"  . CARDIAC CATHETERIZATION  11//2015  . CARDIAC CATHETERIZATION N/A 05/11/2016   Procedure: Left Heart Cath and Cors/Grafts Angiography;  Surgeon: Jettie Booze, MD;  Location: [place] CV LAB;  Service: Cardiovascular;  Laterality: N/A;  . CARDIAC CATHETERIZATION N/A 05/19/2016   Procedure: Coronary/Graft Atherectomy;  Surgeon: Frona Yost M [place]ique, MD;  Location: [place] CV LAB;  Service: Cardiovascular;  Laterality: N/A;  . CARDIAC CATHETERIZATION N/A  05/19/2016   Procedure: Coronary Stent Intervention;  Surgeon: Syriah Delisi M [place], MD;  Location: [place] CV LAB;  Service: Cardiovascular;  Laterality: N/A;  . CATARACT EXTRACTION W/ INTRAOCULAR LENS  IMPLANT, BILATERAL Bilateral 2016  . CORONARY ANGIOPLASTY WITH STENT PLACEMENT  1996; 2002; 05/19/2016   "1 + 2 +2"  . CORONARY ARTERY BYPASS GRAFT N/A 10/03/2014   Procedure: CORONARY ARTERY BYPASS GRAFTING (CABG) x  three, using left internal mammary artery and right leg greater saphenous vein harvested endoscopically;  Surgeon: Melrose Nakayama, MD;  Location: [place];  Service: Open Heart Surgery;  Laterality: N/A;  . LEFT  HEART CATHETERIZATION WITH CORONARY ANGIOGRAM N/A 09/28/2014   Procedure: LEFT HEART CATHETERIZATION WITH CORONARY ANGIOGRAM;  Surgeon: Troy Sine, MD;  Location: [facility] CATH LAB;  Service: Cardiovascular;  Laterality: N/A;  . TEE WITHOUT CARDIOVERSION N/A 10/03/2014   Procedure: TRANSESOPHAGEAL ECHOCARDIOGRAM (TEE);  Surgeon: Melrose Nakayama, MD;  Location: [place];  Service: Open Heart Surgery;  Laterality: N/A;  . TONSILLECTOMY  1950s  . TRIGGER FINGER RELEASE Bilateral 1990s-2000s    Current Medications: Current Outpatient Medications  Medication Sig Dispense Refill  . allopurinol (ZYLOPRIM) 300 MG tablet Take 150 mg by mouth daily.     [name] aspirin 81 MG tablet Take 81 mg by mouth daily.    . clopidogrel (PLAVIX) 75 MG tablet Take 1 tablet by mouth once daily 90 tablet 0  . ezetimibe (ZETIA) 10 MG tablet Take 10 mg by mouth daily.      [name] FOLIC ACID PO Take 161 mg by mouth daily.     [name] levothyroxine (SYNTHROID, LEVOTHROID) 100 MCG tablet Take 100 mcg by mouth daily.      . metFORMIN (GLUCOPHAGE) 500 MG tablet Take 1 tablet (500 mg total) by mouth 2 (two) times daily with a meal. (Patient taking differently: Take 500 mg by mouth 2 (two) times daily with a meal. Takes 2 tablet BID)    . nitroGLYCERIN (NITROSTAT) 0.4 MG SL tablet Place 0.4 mg under the tongue every 5 (five) minutes as needed for chest pain. DISSOLVE ONE TABLET UNDER THE TONGUE EVERY 5 MINUTES AS NEEDED FOR CHEST PAIN.  DO NOT EXCEED A TOTAL OF 3 DOSES IN 15 MINUTES    . omega-3 acid ethyl esters (LOVAZA) 1 g capsule Take 1 capsule (1 g total) by mouth 2 (two) times daily. (Patient taking differently: Take 1 g by mouth 2 (two) times daily. Takes 2 tablet 2 times a day) 360 capsule 0  . pantoprazole (PROTONIX) 40 MG tablet Take 1 tablet (40 mg total) by mouth daily. 30 tablet 11  . PARoxetine (PAXIL) 20 MG tablet Take 20 mg by mouth daily.      . ramipril (ALTACE) 10 MG capsule Take 10 mg by mouth daily.     No current  facility-administered medications for this visit.     Allergies:   Statins   Social History   Socioeconomic History  . Marital status: Married    Spouse name: Not on file  . Number of children: 1  . Years of education: Not on file  . Highest education level: Not on file  Occupational History  . Occupation: Agricultural consultant [employer]  Tobacco Use  . Smoking status: Former Smoker    Packs/day: 1.00    Years: 20.00    Pack years: 20.00    Types: Cigarettes    Start date: 09/20/1995    Quit date: 09/24/1995    Years since quitting: 24.6  .  Smokeless tobacco: Never Used  Substance and Sexual Activity  . Alcohol use: Yes    Alcohol/week: 6.0 standard drinks    Types: 6 Cans of beer per week  . Drug use: No  . Sexual activity: Yes  Other Topics Concern  . Not on file  Social History Narrative  . Not on file   Social Determinants of Health   Financial Resource Strain:   . Difficulty of Paying Living Expenses:   Food Insecurity:   . Worried About Charity fundraiser in the Last Year:   . Arboriculturist in the Last Year:   Transportation Needs:   . Film/video editor (Medical):   [name] Lack of Transportation (Non-Medical):   Physical Activity:   . Days of Exercise per Week:   . Minutes of Exercise per Session:   Stress:   . Feeling of Stress :   Social Connections:   . Frequency of Communication with Friends and Family:   . Frequency of Social Gatherings with Friends and Family:   . Attends Religious Services:   . Active Member of Clubs or Organizations:   . Attends Archivist Meetings:   [name] Marital Status:      Family History:  The patient's family history includes Colon cancer in his brother and mother; GI problems in his father; Heart attack in his sister; Heart failure in his mother.   ROS:   Please see the history of present illness.   All other systems are reviewed and otherwise negative.    PHYSICAL EXAM:   VS:  BP 130/80 (BP Location: Left Arm, Patient  Position: Sitting, Cuff Size: Normal)   Pulse 78   Temp (!) 95.2 F (35.1 C)   Ht 6\' 1"  (1.854 m)   Wt 215 lb (97.5 kg)   SpO2 96%   BMI 28.37 kg/m   BMI: Body mass index is 28.37 kg/m. GENERAL:  Well appearing WM in NAD HEENT:  PERRL, EOMI, sclera are clear. Oropharynx is clear. NECK:  No jugular venous distention, carotid upstroke brisk and symmetric, no bruits, no thyromegaly or adenopathy LUNGS:  Clear to auscultation bilaterally CHEST:  Unremarkable HEART:  RRR,  PMI not displaced or sustained,S1 and S2 within normal limits, no S3, no S4: no clicks, no rubs, no murmurs ABD:  Soft, nontender. BS +, no masses or bruits. No hepatomegaly, no splenomegaly EXT:  2 + pulses throughout, no edema, no cyanosis no clubbing SKIN:  Warm and dry.  No rashes NEURO:  Alert and oriented x 3. Cranial nerves II through XII intact. PSYCH:  Cognitively intact   Wt Readings from Last 3 Encounters:  05/06/20 215 lb (97.5 kg)  10/26/19 216 lb (98 kg)  04/21/19 218 lb (98.9 kg)      Studies/Labs Reviewed:   EKG:   EKG was  Not ordered today.   Recent Labs: No results found for requested labs within last 8760 hours.   Lipid Panel    Component Value Date/Time   CHOL 250 (H) 05/07/2016 0736   TRIG 218 (H) 05/07/2016 0736   HDL 50 05/07/2016 0736   CHOLHDL 5.0 05/07/2016 0736   VLDL 44 (H) 05/07/2016 0736   LDLCALC 156 (H) 05/07/2016 0736    Additional studies/ records that were reviewed today include:  Labs dated 02/16/17: cholesterol 266, triglycerides 253, HDL 46, LDL 169. CBC normal except platelets 129K.  Dated 02/24/17: A1c 7.3%. Iron studies and TFTs normal. AST 90, ALT 128. Other chemistries  are normal.  Dated 10/12/17: A1c 6%. Hgb normal. Potassium 5.2. AST 60. TSH normal. Triglycerides 98, HDL 58, LDL 161. Dated 11/29/18: potassium 5.1. creatinine 0.8. glucose 181. AST 97, ALT 108. Cholesterol 252, triglycerides 290, HDL 41, LDL 153. plts 143K. Otherwise CBC  normal.   ASSESSMENT & PLAN:   1. CAD s/p CABG 2015. S/p rotational atherectomy and stenting of the RCA in July 2017. He is asymptomatic.  Continue medical therapy. Focus on lifestyle modification. Not on BB due to h/o marked sinus bradycardia.  2. Mild AI - follow clinically. 3. HTN - well controlled today. He has component of white coat syndrome. 4. Hyperlipidemia with chronicaly elevated LFTs - US shows a fatty liver.  On Zetia. Hasn't wanted to pursue PCSK 9 inhibitor. States he just needs to eat better.  5. Thrombocytopenia - no bleeding  Disposition: F/u with me in 6 months.    Signed, Lear Carstens [place] MD, [facility]  05/06/2020 10:46 AM    [place]

## 2020-05-06 ENCOUNTER — Encounter: Payer: Self-pay | Admitting: Cardiology

## 2020-05-06 ENCOUNTER — Other Ambulatory Visit: Payer: Self-pay

## 2020-05-06 ENCOUNTER — Ambulatory Visit (INDEPENDENT_AMBULATORY_CARE_PROVIDER_SITE_OTHER): Payer: Medicare PPO | Admitting: Cardiology

## 2020-05-06 VITALS — BP 130/80 | HR 78 | Temp 95.2°F | Ht 73.0 in | Wt 215.0 lb

## 2020-05-06 DIAGNOSIS — E7800 Pure hypercholesterolemia, unspecified: Secondary | ICD-10-CM

## 2020-05-06 DIAGNOSIS — I2510 Atherosclerotic heart disease of native coronary artery without angina pectoris: Secondary | ICD-10-CM | POA: Diagnosis not present

## 2020-05-06 DIAGNOSIS — I10 Essential (primary) hypertension: Secondary | ICD-10-CM | POA: Diagnosis not present

## 2020-05-06 DIAGNOSIS — Z951 Presence of aortocoronary bypass graft: Secondary | ICD-10-CM | POA: Diagnosis not present

## 2020-11-12 NOTE — Progress Notes (Signed)
Cardiology Office Note    Date:  11/12/2020  ID:  Miguel Vasquez, DOB September 04, 1946, MRN [id number] PCP:  Nicolasa Ducking, MD  Cardiologist:  Joseph Bias [place] MD  Chief Complaint: f/u CAD  History of Present Illness:  Miguel Vasquez is a 75 y.o. male with history of CAD (stent to Cx 1996, stent to RCA 2002, s/p CABGx3 in 2015, rotational atherectomy/DESx2 to RCA 05/2017), HLD (intolerant of statins due to elevated liver/muscle enzymes), HTN, DM, hemochromatosis, cervical disc disease, panic disorder, hypothyroidism, h/o marked sinus bradycardia (BB stopped) who presents for f/u CAD.  Last 2D Echo 09/2014: mild-mod LVH, EF 55-60%, cannot exclude RWMA, mild AI. This past summer he was complaining of atypical chest pain but this was similar to what prompted CABG. Nuc was abnormal so he underwent diagnostic cath 05/11/16 showing severe, diffuse calcific disease and a very large dominant RCA - essentially, chronic occlusion at the ostium. There were brisk left to right collaterals. He had occluded SVG-OM. 50% mLAD, patent LIMA-LAD. LVEF and LVEDP were normal. He was brought back for staged PCI with successful rotational atherectomy and stenting of the ostial to mid RCA with DES x 2. DAPT was recommended indefinitely.  We had also checked an echo 05/07/16 for systolic murmur showing mod LVH, EF 60-65%, grade 1 DD, mild AI.  On follow up today he denies any chest pain or SOB. He is still  Having some issues with his low back tightening up.  he is still active doing yard work.    He has  Not been  a candidate for statins due to elevated LFTs. He has a fatty liver. He is on Zetia but has persistently high LDL.   Since his last visit he has made significant changes in his diet. He states he cut back on sweets, beer, and bread. Recent lab work show a significant improvement in his sugar, lipids and LFTs. Now on Crestor 10 mg once a week   Past Medical History:  Diagnosis Date  . Aortic insufficiency    a. Mild AI by  echo 04/2016.  [name] Basal cell carcinoma, face   . Coronary disease    a. stent to Cx 1996. b. stent to RCA 2002. c. s/p CABGx3 in 2015. d.  rotational atherectomy and DESx2 to ostial to mid RCA.  [name] Elevated liver enzymes   . GERD (gastroesophageal reflux disease)   . H. pylori infection tx'd in the 1970s  . Hemochromatosis   . History of gout 1990s X 1  . Hypercholesterolemia   . Hypertension    Controlled  . Hypothyroid   . Myocardial infarction [facility]) 2002   "mild"  . Obesity   . Panic disorder    Hx of panic disorder  . S/P primary angioplasty with coronary stent and rotational atherectomy of ostial mRCA with DES X 2-- 7/11/7 05/20/2016  . Sinus bradycardia    a. h/o marked sinus bradycardia, BB stopped.  . Tachycardia    a. Noted on stress test strips in 2017. ? Atrial flutter. Further w/u underway.  . Thrombocytopenia ([facility])    a. per review of labs.  . Type II diabetes mellitus ([facility])     Past Surgical History:  Procedure Laterality Date  . BASAL CELL CARCINOMA EXCISION     "face & lips"  . CARDIAC CATHETERIZATION  11//2015  . CARDIAC CATHETERIZATION N/A 05/11/2016   Procedure: Left Heart Cath and Cors/Grafts Angiography;  Surgeon: Corky Crafts, MD;  Location: [facility] INVASIVE CV LAB;  Service: Cardiovascular;  Laterality: N/A;  . CARDIAC CATHETERIZATION N/A 05/19/2016   Procedure: Coronary/Graft Atherectomy;  Surgeon: Romulus Hanrahan M Martinique, MD;  Location: [place] CV LAB;  Service: Cardiovascular;  Laterality: N/A;  . CARDIAC CATHETERIZATION N/A 05/19/2016   Procedure: Coronary Stent Intervention;  Surgeon: Jedrek Dinovo M Martinique, MD;  Location: [place] CV LAB;  Service: Cardiovascular;  Laterality: N/A;  . CATARACT EXTRACTION W/ INTRAOCULAR LENS  IMPLANT, BILATERAL Bilateral 2016  . CORONARY ANGIOPLASTY WITH STENT PLACEMENT  1996; 2002; 05/19/2016   "1 + 2 +2"  . CORONARY ARTERY BYPASS GRAFT N/A 10/03/2014   Procedure: CORONARY ARTERY BYPASS GRAFTING (CABG) x  three, using left  internal mammary artery and right leg greater saphenous vein harvested endoscopically;  Surgeon: Melrose Nakayama, MD;  Location: [place];  Service: Open Heart Surgery;  Laterality: N/A;  . LEFT HEART CATHETERIZATION WITH CORONARY ANGIOGRAM N/A 09/28/2014   Procedure: LEFT HEART CATHETERIZATION WITH CORONARY ANGIOGRAM;  Surgeon: Troy Sine, MD;  Location: [facility] CATH LAB;  Service: Cardiovascular;  Laterality: N/A;  . TEE WITHOUT CARDIOVERSION N/A 10/03/2014   Procedure: TRANSESOPHAGEAL ECHOCARDIOGRAM (TEE);  Surgeon: Melrose Nakayama, MD;  Location: [place];  Service: Open Heart Surgery;  Laterality: N/A;  . TONSILLECTOMY  1950s  . TRIGGER FINGER RELEASE Bilateral 1990s-2000s    Current Medications: Current Outpatient Medications  Medication Sig Dispense Refill  . allopurinol (ZYLOPRIM) 300 MG tablet Take 150 mg by mouth daily.     [name] aspirin 81 MG tablet Take 81 mg by mouth daily.    . clopidogrel (PLAVIX) 75 MG tablet Take 1 tablet by mouth once daily 90 tablet 0  . ezetimibe (ZETIA) 10 MG tablet Take 10 mg by mouth daily.      [name] FOLIC ACID PO Take [id number] mg by mouth daily.     [name] levothyroxine (SYNTHROID, LEVOTHROID) 100 MCG tablet Take 100 mcg by mouth daily.      . metFORMIN (GLUCOPHAGE) 500 MG tablet Take 1 tablet (500 mg total) by mouth 2 (two) times daily with a meal. (Patient taking differently: Take 500 mg by mouth 2 (two) times daily with a meal. Takes 2 tablet BID)    . nitroGLYCERIN (NITROSTAT) 0.4 MG SL tablet Place 0.4 mg under the tongue every 5 (five) minutes as needed for chest pain. DISSOLVE ONE TABLET UNDER THE TONGUE EVERY 5 MINUTES AS NEEDED FOR CHEST PAIN.  DO NOT EXCEED A TOTAL OF 3 DOSES IN 15 MINUTES    . omega-3 acid ethyl esters (LOVAZA) 1 g capsule Take 1 capsule (1 g total) by mouth 2 (two) times daily. (Patient taking differently: Take 1 g by mouth 2 (two) times daily. Takes 2 tablet 2 times a day) 360 capsule 0  . pantoprazole (PROTONIX) 40 MG tablet Take 1 tablet  (40 mg total) by mouth daily. 30 tablet 11  . PARoxetine (PAXIL) 20 MG tablet Take 20 mg by mouth daily.      . ramipril (ALTACE) 10 MG capsule Take 10 mg by mouth daily.     No current facility-administered medications for this visit.     Allergies:   Statins   Social History   Socioeconomic History  . Marital status: Married    Spouse name: Not on file  . Number of children: 1  . Years of education: Not on file  . Highest education level: Not on file  Occupational History  . Occupation: Agricultural consultant [employer]  Tobacco Use  . Smoking status: Former Smoker  Packs/day: 1.00    Years: 20.00    Pack years: 20.00    Types: Cigarettes    Start date: 09/20/1995    Quit date: 09/24/1995    Years since quitting: 25.1  . Smokeless tobacco: Never Used  Substance and Sexual Activity  . Alcohol use: Yes    Alcohol/week: 6.0 standard drinks    Types: 6 Cans of beer per week  . Drug use: No  . Sexual activity: Yes  Other Topics Concern  . Not on file  Social History Narrative  . Not on file   Social Determinants of Health   Financial Resource Strain: Not on file  Food Insecurity: Not on file  Transportation Needs: Not on file  Physical Activity: Not on file  Stress: Not on file  Social Connections: Not on file     Family History:  The patient's family history includes Colon cancer in his brother and mother; GI problems in his father; Heart attack in his sister; Heart failure in his mother.   ROS:   Please see the history of present illness.   All other systems are reviewed and otherwise negative.    PHYSICAL EXAM:   VS:  There were no vitals taken for this visit.  BMI: There is no height or weight on file to calculate BMI. GENERAL:  Well appearing WM in NAD HEENT:  PERRL, EOMI, sclera are clear. Oropharynx is clear. NECK:  No jugular venous distention, carotid upstroke brisk and symmetric, no bruits, no thyromegaly or adenopathy LUNGS:  Clear to auscultation  bilaterally CHEST:  Unremarkable HEART:  RRR,  PMI not displaced or sustained,S1 and S2 within normal limits, no S3, no S4: no clicks, no rubs, no murmurs ABD:  Soft, nontender. BS +, no masses or bruits. No hepatomegaly, no splenomegaly EXT:  2 + pulses throughout, no edema, no cyanosis no clubbing SKIN:  Warm and dry.  No rashes NEURO:  Alert and oriented x 3. Cranial nerves II through XII intact. PSYCH:  Cognitively intact   Wt Readings from Last 3 Encounters:  05/06/20 215 lb (97.5 kg)  10/26/19 216 lb (98 kg)  04/21/19 218 lb (98.9 kg)      Studies/Labs Reviewed:   EKG:   EKG was  Not ordered today.   Recent Labs: No results found for requested labs within last 8760 hours.   Lipid Panel    Component Value Date/Time   CHOL 250 (H) 05/07/2016 0736   TRIG 218 (H) 05/07/2016 0736   HDL 50 05/07/2016 0736   CHOLHDL 5.0 05/07/2016 0736   VLDL 44 (H) 05/07/2016 0736   LDLCALC 156 (H) 05/07/2016 0736    Additional studies/ records that were reviewed today include:  Labs dated 02/16/17: cholesterol 266, triglycerides 253, HDL 46, LDL 169. CBC normal except platelets 129K.  Dated 02/24/17: A1c 7.3%. Iron studies and TFTs normal. AST 90, ALT 128. Other chemistries are normal.  Dated 10/12/17: A1c 6%. Hgb normal. Potassium 5.2. AST 60. TSH normal. Triglycerides 98, HDL 58, LDL 161. Dated 11/29/18: potassium 5.1. creatinine 0.8. glucose 181. AST 97, ALT 108. Cholesterol 252, triglycerides 290, HDL 41, LDL 153. plts 143K. Otherwise CBC normal. Dated 08/14/20: cholesterol 188, triglycerides 163, HDL 46, LDL 113. AST 74, ALT 79. TSH normal. Platelets 138K. Glucose 143, A1c 7.2%. He has further lab work ? Date but recent. LFTs better with AST 50, ALT 55, A1c down to 6.5%. repeat lipids pending.    ASSESSMENT & PLAN:   1. CAD s/p  CABG 2015. S/p rotational atherectomy and stenting of the RCA in July 2017. He is asymptomatic.  Continue medical therapy. Focus on lifestyle modification.  Not on BB due to h/o marked sinus bradycardia.  2. Mild AI - follow clinically. 3. HTN - under fair control. today. He has component of white coat syndrome. 4. Hyperlipidemia with chronicaly elevated LFTs - US shows a fatty liver.  On Zetia. He has made significant dietary modification with marked improvement in sugars, LFTs, and lipids. Congratulated his efforts. Now on low dose statin  5. Thrombocytopenia - no bleeding  Disposition: F/u with me in 6 months.    Signed, Jeanie Mccard [place] MD, [facility]  11/12/2020 9:37 AM    [place]

## 2020-11-15 ENCOUNTER — Ambulatory Visit (INDEPENDENT_AMBULATORY_CARE_PROVIDER_SITE_OTHER): Payer: Medicare PPO | Admitting: Cardiology

## 2020-11-15 ENCOUNTER — Other Ambulatory Visit: Payer: Self-pay

## 2020-11-15 ENCOUNTER — Encounter: Payer: Self-pay | Admitting: Cardiology

## 2020-11-15 VITALS — BP 150/82 | HR 68 | Ht 74.0 in | Wt 215.0 lb

## 2020-11-15 DIAGNOSIS — Z951 Presence of aortocoronary bypass graft: Secondary | ICD-10-CM | POA: Diagnosis not present

## 2020-11-15 DIAGNOSIS — I10 Essential (primary) hypertension: Secondary | ICD-10-CM

## 2020-11-15 DIAGNOSIS — I2510 Atherosclerotic heart disease of native coronary artery without angina pectoris: Secondary | ICD-10-CM

## 2020-11-15 DIAGNOSIS — E7800 Pure hypercholesterolemia, unspecified: Secondary | ICD-10-CM

## 2021-06-02 NOTE — Progress Notes (Signed)
Cardiology Office Note    Date:  06/04/2021  ID:  Miguel Vasquez, DOB 1946/04/15, MRN [id number] PCP:  Chriss Czar, MD  Cardiologist:  Roanne Haye Martinique MD  Chief Complaint: f/u CAD  History of Present Illness:  Miguel Vasquez is a 75 y.o. male with history of CAD (stent to Cx 1996, stent to RCA 2002, s/p CABGx3 in 2015, rotational atherectomy/DESx2 to RCA 05/2017), HLD (intolerant of statins due to elevated liver/muscle enzymes), HTN, DM, hemochromatosis, cervical disc disease, panic disorder, hypothyroidism, h/o marked sinus bradycardia (BB stopped) who presents for f/u CAD.  Last 2D Echo 09/2014: mild-mod LVH, EF 55-60%, cannot exclude RWMA, mild AI. This past summer he was complaining of atypical chest pain but this was similar to what prompted CABG. Nuc was abnormal so he underwent diagnostic cath 05/11/16 showing severe, diffuse calcific disease and a very large dominant RCA - essentially, chronic occlusion at the ostium. There were brisk left to right collaterals. He had occluded SVG-OM. 50% mLAD, patent LIMA-LAD. LVEF and LVEDP were normal. He was brought back for staged PCI with successful rotational atherectomy and stenting of the ostial to mid RCA with DES x 2. DAPT was recommended indefinitely.  We had also checked an echo [id number] for systolic murmur showing mod LVH, EF 60-65%, grade 1 DD, mild AI.  On follow up today he denies any chest pain or SOB. He is still  Having some issues with his low back tightening up.  he is still active doing yard work.    He has  Not been  a candidate for statins due to elevated LFTs. He has a fatty liver. He is on Zetia but has persistently high LDL.   He is doing well today. Still struggles to eat healthy. He states he cut back on sweets, beer, and bread. His primary care has retired and he is trying to get established with a new doc.    Past Medical History:  Diagnosis Date   Aortic insufficiency    a. Mild AI by echo 04/2016.   Basal cell carcinoma,  face    Coronary disease    a. stent to Cx 1996. b. stent to RCA 2002. c. s/p CABGx3 in 2015. d.  rotational atherectomy and DESx2 to ostial to mid RCA.   Elevated liver enzymes    GERD (gastroesophageal reflux disease)    H. pylori infection tx'd in the 1970s   Hemochromatosis    History of gout 1990s X 1   Hypercholesterolemia    Hypertension    Controlled   Hypothyroid    Myocardial infarction [facility]) 2002   "mild"   Obesity    Panic disorder    Hx of panic disorder   S/P primary angioplasty with coronary stent and rotational atherectomy of ostial mRCA with DES X 2-- 7/11/7 05/20/2016   Sinus bradycardia    a. h/o marked sinus bradycardia, BB stopped.   Tachycardia    a. Noted on stress test strips in 2017. ? Atrial flutter. Further w/u underway.   Thrombocytopenia ([facility])    a. per review of labs.   Type II diabetes mellitus [facility])     Past Surgical History:  Procedure Laterality Date   BASAL CELL CARCINOMA EXCISION     "face & lips"   CARDIAC CATHETERIZATION  11//2015   CARDIAC CATHETERIZATION N/A 05/11/2016   Procedure: Left Heart Cath and Cors/Grafts Angiography;  Surgeon: Jettie Booze, MD;  Location: [place] CV LAB;  Service: Cardiovascular;  Laterality: N/A;  CARDIAC CATHETERIZATION N/A 05/19/2016   Procedure: Coronary/Graft Atherectomy;  Surgeon: Lance Galas M Martinique, MD;  Location: [place] CV LAB;  Service: Cardiovascular;  Laterality: N/A;   CARDIAC CATHETERIZATION N/A 05/19/2016   Procedure: Coronary Stent Intervention;  Surgeon: Hicks Feick M Martinique, MD;  Location: [place] CV LAB;  Service: Cardiovascular;  Laterality: N/A;   CATARACT EXTRACTION W/ INTRAOCULAR LENS  IMPLANT, BILATERAL Bilateral 2016   CORONARY ANGIOPLASTY WITH STENT PLACEMENT  1996; 2002; 05/19/2016   "1 + 2 +2"   CORONARY ARTERY BYPASS GRAFT N/A 10/03/2014   Procedure: CORONARY ARTERY BYPASS GRAFTING (CABG) x  three, using left internal mammary artery and right leg greater saphenous vein  harvested endoscopically;  Surgeon: Melrose Nakayama, MD;  Location: [place];  Service: Open Heart Surgery;  Laterality: N/A;   LEFT HEART CATHETERIZATION WITH CORONARY ANGIOGRAM N/A 09/28/2014   Procedure: LEFT HEART CATHETERIZATION WITH CORONARY ANGIOGRAM;  Surgeon: Troy Sine, MD;  Location: [facility] CATH LAB;  Service: Cardiovascular;  Laterality: N/A;   TEE WITHOUT CARDIOVERSION N/A 10/03/2014   Procedure: TRANSESOPHAGEAL ECHOCARDIOGRAM (TEE);  Surgeon: Melrose Nakayama, MD;  Location: [place];  Service: Open Heart Surgery;  Laterality: N/A;   TONSILLECTOMY  1950s   TRIGGER FINGER RELEASE Bilateral 1990s-2000s    Current Medications: Current Outpatient Medications  Medication Sig Dispense Refill   allopurinol (ZYLOPRIM) 300 MG tablet Take 150 mg by mouth daily.      aspirin 81 MG tablet Take 81 mg by mouth daily.     Cholecalciferol (VITAMIN D3) 25 MCG (1000 UT) CAPS Take by mouth.     clopidogrel (PLAVIX) 75 MG tablet Take 1 tablet by mouth once daily 90 tablet 0   cyanocobalamin 1000 MCG tablet Take 1,000 mcg by mouth daily.     ezetimibe (ZETIA) 10 MG tablet Take 10 mg by mouth daily.       FOLIC ACID PO Take [id number] mg by mouth daily.      glipiZIDE (GLUCOTROL XL) 5 MG 24 hr tablet Take 5 mg by mouth daily with breakfast.     levothyroxine (SYNTHROID, LEVOTHROID) 100 MCG tablet Take 100 mcg by mouth daily.       metFORMIN (GLUCOPHAGE) 500 MG tablet Take 1 tablet (500 mg total) by mouth 2 (two) times daily with a meal. (Patient taking differently: Take 500 mg by mouth 2 (two) times daily with a meal. Takes 2 tablet BID)     nitroGLYCERIN (NITROSTAT) 0.4 MG SL tablet Place 0.4 mg under the tongue every 5 (five) minutes as needed for chest pain. DISSOLVE ONE TABLET UNDER THE TONGUE EVERY 5 MINUTES AS NEEDED FOR CHEST PAIN.  DO NOT EXCEED A TOTAL OF 3 DOSES IN 15 MINUTES     omega-3 acid ethyl esters (LOVAZA) 1 g capsule Take 1 capsule (1 g total) by mouth 2 (two) times daily. (Patient  taking differently: Take 1 g by mouth 2 (two) times daily. Takes 2 tablet 2 times a day) 360 capsule 0   pantoprazole (PROTONIX) 40 MG tablet Take 1 tablet (40 mg total) by mouth daily. 30 tablet 11   PARoxetine (PAXIL) 20 MG tablet Take 20 mg by mouth daily.       ramipril (ALTACE) 10 MG capsule Take 10 mg by mouth daily.     rosuvastatin (CRESTOR) 10 MG tablet Take 10 mg by mouth once a week.     No current facility-administered medications for this visit.     Allergies:   Statins  Social History   Socioeconomic History   Marital status: Married    Spouse name: Not on file   Number of children: 1   Years of education: Not on file   Highest education level: Not on file  Occupational History   Occupation: Agricultural consultant [employer]  Tobacco Use   Smoking status: Former    Packs/day: 1.00    Years: 20.00    Pack years: 20.00    Types: Cigarettes    Start date: 09/20/1995    Quit date: 09/24/1995    Years since quitting: 25.7   Smokeless tobacco: Never  Substance and Sexual Activity   Alcohol use: Yes    Alcohol/week: 6.0 standard drinks    Types: 6 Cans of beer per week   Drug use: No   Sexual activity: Yes  Other Topics Concern   Not on file  Social History Narrative   Not on file   Social Determinants of Health   Financial Resource Strain: Not on file  Food Insecurity: Not on file  Transportation Needs: Not on file  Physical Activity: Not on file  Stress: Not on file  Social Connections: Not on file     Family History:  The patient's family history includes Colon cancer in his brother and mother; GI problems in his father; Heart attack in his sister; Heart failure in his mother.   ROS:   Please see the history of present illness.   All other systems are reviewed and otherwise negative.    PHYSICAL EXAM:   VS:  BP (!) 150/90 (BP Location: Right Arm)   Pulse 77   Ht '6\' 1"'$  (1.854 m)   Wt 216 lb (98 kg)   SpO2 95%   BMI 28.50 kg/m   BMI: Body mass index is 28.5  kg/m. GENERAL:  Well appearing WM in NAD HEENT:  PERRL, EOMI, sclera are clear. Oropharynx is clear. NECK:  No jugular venous distention, carotid upstroke brisk and symmetric, no bruits, no thyromegaly or adenopathy LUNGS:  Clear to auscultation bilaterally CHEST:  Unremarkable HEART:  RRR,  PMI not displaced or sustained,S1 and S2 within normal limits, no S3, no S4: no clicks, no rubs, no murmurs ABD:  Soft, nontender. BS +, no masses or bruits. No hepatomegaly, no splenomegaly EXT:  2 + pulses throughout, no edema, no cyanosis no clubbing SKIN:  Warm and dry.  No rashes NEURO:  Alert and oriented x 3. Cranial nerves II through XII intact. PSYCH:  Cognitively intact   Wt Readings from Last 3 Encounters:  06/04/21 216 lb (98 kg)  11/15/20 215 lb (97.5 kg)  05/06/20 215 lb (97.5 kg)      Studies/Labs Reviewed:   EKG:   EKG was  Not ordered today.   Recent Labs: No results found for requested labs within last 8760 hours.   Lipid Panel    Component Value Date/Time   CHOL 250 (H) 05/07/2016 0736   TRIG 218 (H) 05/07/2016 0736   HDL 50 05/07/2016 0736   CHOLHDL 5.0 05/07/2016 0736   VLDL 44 (H) 05/07/2016 0736   LDLCALC 156 (H) 05/07/2016 0736    Additional studies/ records that were reviewed today include:  Labs dated 02/16/17: cholesterol 266, triglycerides 253, HDL 46, LDL 169. CBC normal except platelets 129K.  Dated 02/24/17: A1c 7.3%. Iron studies and TFTs normal. AST 90, ALT 128. Other chemistries are normal.  Dated 10/12/17: A1c 6%. Hgb normal. Potassium 5.2. AST 60. TSH normal. Triglycerides 98, HDL 58, LDL 161.  Dated 11/29/18: potassium 5.1. creatinine 0.8. glucose 181. AST 97, ALT 108. Cholesterol 252, triglycerides 290, HDL 41, LDL 153. plts 143K. Otherwise CBC normal. Dated 08/14/20: cholesterol 188, triglycerides 163, HDL 46, LDL 113. AST 74, ALT 79. TSH normal. Platelets 138K. Glucose 143, A1c 7.2%.    ASSESSMENT & PLAN:   CAD s/p CABG 2015. S/p rotational  atherectomy and stenting of the RCA in July 2017. He is asymptomatic.  Continue medical therapy. Focus on lifestyle modification. Not on BB due to h/o marked sinus bradycardia.  Mild AI - follow clinically. HTN - under fair control. today. He has white coat syndrome. Hyperlipidemia with chronicaly elevated LFTs - US shows a fatty liver.  On Zetia. Continue dietary modification.Congratulated his efforts. Now on low dose statin  Thrombocytopenia - no bleeding  Disposition: F/u with me in 6 months.    Signed, Jaidah Lomax Martinique MD, [facility]  06/04/2021 11:25 AM    [place]

## 2021-06-04 ENCOUNTER — Other Ambulatory Visit: Payer: Self-pay

## 2021-06-04 ENCOUNTER — Encounter: Payer: Self-pay | Admitting: Cardiology

## 2021-06-04 ENCOUNTER — Ambulatory Visit: Payer: Medicare PPO | Admitting: Cardiology

## 2021-06-04 VITALS — BP 150/90 | HR 77 | Ht 73.0 in | Wt 216.0 lb

## 2021-06-04 DIAGNOSIS — E7800 Pure hypercholesterolemia, unspecified: Secondary | ICD-10-CM | POA: Diagnosis not present

## 2021-06-04 DIAGNOSIS — I2510 Atherosclerotic heart disease of native coronary artery without angina pectoris: Secondary | ICD-10-CM

## 2021-06-04 DIAGNOSIS — I10 Essential (primary) hypertension: Secondary | ICD-10-CM | POA: Diagnosis not present

## 2021-06-04 DIAGNOSIS — Z951 Presence of aortocoronary bypass graft: Secondary | ICD-10-CM

## 2021-12-29 NOTE — Progress Notes (Signed)
Cardiology Office Note    Date:  01/05/2022  ID:  Miguel Vasquez, DOB 12-14-1945, MRN [id number] PCP:  Audie Box, MD  Cardiologist:  Maciel Kegg Martinique MD  Chief Complaint: f/u CAD  History of Present Illness:  Miguel Vasquez is a 76 y.o. male with history of CAD (stent to Cx 1996, stent to RCA 2002, s/p CABGx3 in 2015, rotational atherectomy/DESx2 to RCA 05/2017), HLD (intolerant of statins due to elevated liver/muscle enzymes), HTN, DM, hemochromatosis, cervical disc disease, panic disorder, hypothyroidism, h/o marked sinus bradycardia (BB stopped) who presents for f/u CAD.  Last 2D Echo 09/2014: mild-mod LVH, EF 55-60%, cannot exclude RWMA, mild AI. This past summer he was complaining of atypical chest pain but this was similar to what prompted CABG. Nuc was abnormal so he underwent diagnostic cath 05/11/16 showing severe, diffuse calcific disease and a very large dominant RCA - essentially, chronic occlusion at the ostium. There were brisk left to right collaterals. He had occluded SVG-OM. 50% mLAD, patent LIMA-LAD. LVEF and LVEDP were normal. He was brought back for staged PCI with successful rotational atherectomy and stenting of the ostial to mid RCA with DES x 2. DAPT was recommended indefinitely.  We had also checked an echo 7/82/95 for systolic murmur showing mod LVH, EF 60-65%, grade 1 DD, mild AI.  On follow up today he denies any chest pain or SOB. He was less active this past year and notes his stamina is not as good.  he is still active doing yard work.  Planning to start playing golf again. He has cut back on bread and has lost weight. He is now taking Crestor daily for the past 3 weeks and is tolerating so far.     Past Medical History:  Diagnosis Date   Aortic insufficiency    a. Mild AI by echo 04/2016.   Basal cell carcinoma, face    Coronary disease    a. stent to Cx 1996. b. stent to RCA 2002. c. s/p CABGx3 in 2015. d.  rotational atherectomy and DESx2 to ostial to mid RCA.    Elevated liver enzymes    GERD (gastroesophageal reflux disease)    H. pylori infection tx'd in the 1970s   Hemochromatosis    History of gout 1990s X 1   Hypercholesterolemia    Hypertension    Controlled   Hypothyroid    Myocardial infarction [facility]) 2002   "mild"   Obesity    Panic disorder    Hx of panic disorder   S/P primary angioplasty with coronary stent and rotational atherectomy of ostial mRCA with DES X 2-- 7/11/7 05/20/2016   Sinus bradycardia    a. h/o marked sinus bradycardia, BB stopped.   Tachycardia    a. Noted on stress test strips in 2017. ? Atrial flutter. Further w/u underway.   Thrombocytopenia ([place])    a. per review of labs.   Type II diabetes mellitus [facility])     Past Surgical History:  Procedure Laterality Date   BASAL CELL CARCINOMA EXCISION     "face & lips"   CARDIAC CATHETERIZATION  11//2015   CARDIAC CATHETERIZATION N/A 05/11/2016   Procedure: Left Heart Cath and Cors/Grafts Angiography;  Surgeon: Jettie Booze, MD;  Location: [place] CV LAB;  Service: Cardiovascular;  Laterality: N/A;   CARDIAC CATHETERIZATION N/A 05/19/2016   Procedure: Coronary/Graft Atherectomy;  Surgeon: Clayton Jarmon M Martinique, MD;  Location: [place] CV LAB;  Service: Cardiovascular;  Laterality: N/A;   CARDIAC CATHETERIZATION  N/A 05/19/2016   Procedure: Coronary Stent Intervention;  Surgeon: Javarious Elsayed M Martinique, MD;  Location: [place] CV LAB;  Service: Cardiovascular;  Laterality: N/A;   CATARACT EXTRACTION W/ INTRAOCULAR LENS  IMPLANT, BILATERAL Bilateral 2016   CORONARY ANGIOPLASTY WITH STENT PLACEMENT  1996; 2002; 05/19/2016   "1 + 2 +2"   CORONARY ARTERY BYPASS GRAFT N/A 10/03/2014   Procedure: CORONARY ARTERY BYPASS GRAFTING (CABG) x  three, using left internal mammary artery and right leg greater saphenous vein harvested endoscopically;  Surgeon: Melrose Nakayama, MD;  Location: [place];  Service: Open Heart Surgery;  Laterality: N/A;   LEFT HEART CATHETERIZATION WITH  CORONARY ANGIOGRAM N/A 09/28/2014   Procedure: LEFT HEART CATHETERIZATION WITH CORONARY ANGIOGRAM;  Surgeon: Troy Sine, MD;  Location: [facility] CATH LAB;  Service: Cardiovascular;  Laterality: N/A;   TEE WITHOUT CARDIOVERSION N/A 10/03/2014   Procedure: TRANSESOPHAGEAL ECHOCARDIOGRAM (TEE);  Surgeon: Melrose Nakayama, MD;  Location: [place];  Service: Open Heart Surgery;  Laterality: N/A;   TONSILLECTOMY  1950s   TRIGGER FINGER RELEASE Bilateral 1990s-2000s    Current Medications: Current Outpatient Medications  Medication Sig Dispense Refill   allopurinol (ZYLOPRIM) 300 MG tablet Take 150 mg by mouth daily.      aspirin 81 MG tablet Take 81 mg by mouth daily.     Cholecalciferol (VITAMIN D3) 25 MCG (1000 UT) CAPS Take by mouth.     clopidogrel (PLAVIX) 75 MG tablet Take 1 tablet by mouth once daily 90 tablet 0   cyanocobalamin 1000 MCG tablet Take 1,000 mcg by mouth daily.     ezetimibe (ZETIA) 10 MG tablet Take 10 mg by mouth daily.       FOLIC ACID PO Take 245 mg by mouth daily.      glipiZIDE (GLUCOTROL XL) 5 MG 24 hr tablet Take 5 mg by mouth daily with breakfast.     levothyroxine (SYNTHROID, LEVOTHROID) 100 MCG tablet Take 100 mcg by mouth daily.       metFORMIN (GLUCOPHAGE) 500 MG tablet Take 1 tablet (500 mg total) by mouth 2 (two) times daily with a meal. (Patient taking differently: Take 1,000 mg by mouth 2 (two) times daily with a meal. Takes 2 tablet BID)     nitroGLYCERIN (NITROSTAT) 0.4 MG SL tablet Place 0.4 mg under the tongue every 5 (five) minutes as needed for chest pain. DISSOLVE ONE TABLET UNDER THE TONGUE EVERY 5 MINUTES AS NEEDED FOR CHEST PAIN.  DO NOT EXCEED A TOTAL OF 3 DOSES IN 15 MINUTES     omega-3 acid ethyl esters (LOVAZA) 1 g capsule Take 1 capsule (1 g total) by mouth 2 (two) times daily. (Patient taking differently: Take 1 g by mouth 2 (two) times daily. Takes 2 tablet 2 times a day) 360 capsule 0   pantoprazole (PROTONIX) 40 MG tablet Take 1 tablet (40 mg  total) by mouth daily. 30 tablet 11   PARoxetine (PAXIL) 20 MG tablet Take 20 mg by mouth daily.       ramipril (ALTACE) 10 MG capsule Take 20 mg by mouth daily.     rosuvastatin (CRESTOR) 10 MG tablet Take 10 mg by mouth daily.     No current facility-administered medications for this visit.     Allergies:   Statins   Social History   Socioeconomic History   Marital status: Married    Spouse name: Not on file   Number of children: 1   Years of education: Not on file  Highest education level: Not on file  Occupational History   Occupation: Agricultural consultant [employer]  Tobacco Use   Smoking status: Former    Packs/day: 1.00    Years: 20.00    Pack years: 20.00    Types: Cigarettes    Start date: 09/20/1995    Quit date: 09/24/1995    Years since quitting: 26.3   Smokeless tobacco: Never  Substance and Sexual Activity   Alcohol use: Yes    Alcohol/week: 6.0 standard drinks    Types: 6 Cans of beer per week   Drug use: No   Sexual activity: Yes  Other Topics Concern   Not on file  Social History Narrative   Not on file   Social Determinants of Health   Financial Resource Strain: Not on file  Food Insecurity: Not on file  Transportation Needs: Not on file  Physical Activity: Not on file  Stress: Not on file  Social Connections: Not on file     Family History:  The patient's family history includes Colon cancer in his brother and mother; GI problems in his father; Heart attack in his sister; Heart failure in his mother.   ROS:   Please see the history of present illness.   All other systems are reviewed and otherwise negative.    PHYSICAL EXAM:   VS:  BP 128/84 (BP Location: Left Arm, Patient Position: Sitting, Cuff Size: Normal)    Pulse 70    Ht 6\' 1"  (1.854 m)    Wt 209 lb (94.8 kg)    BMI 27.57 kg/m   BMI: Body mass index is 27.57 kg/m. GENERAL:  Well appearing WM in NAD HEENT:  PERRL, EOMI, sclera are clear. Oropharynx is clear. NECK:  No jugular venous distention,  carotid upstroke brisk and symmetric, no bruits, no thyromegaly or adenopathy LUNGS:  Clear to auscultation bilaterally CHEST:  Unremarkable HEART:  RRR,  PMI not displaced or sustained,S1 and S2 within normal limits, no S3, no S4: no clicks, no rubs, no murmurs ABD:  Soft, nontender. BS +, no masses or bruits. No hepatomegaly, no splenomegaly EXT:  2 + pulses throughout, no edema, no cyanosis no clubbing SKIN:  Warm and dry.  No rashes NEURO:  Alert and oriented x 3. Cranial nerves II through XII intact. PSYCH:  Cognitively intact   Wt Readings from Last 3 Encounters:  01/05/22 209 lb (94.8 kg)  06/04/21 216 lb (98 kg)  11/15/20 215 lb (97.5 kg)      Studies/Labs Reviewed:   EKG:   EKG is ordered today. NSR rate 70. Nonspecific ST-T changes. I have personally reviewed and interpreted this study.    Recent Labs: No results found for requested labs within last 8760 hours.   Lipid Panel    Component Value Date/Time   CHOL 250 (H) 05/07/2016 0736   TRIG 218 (H) 05/07/2016 0736   HDL 50 05/07/2016 0736   CHOLHDL 5.0 05/07/2016 0736   VLDL 44 (H) 05/07/2016 0736   LDLCALC 156 (H) 05/07/2016 0736    Additional studies/ records that were reviewed today include:  Labs dated 02/16/17: cholesterol 266, triglycerides 253, HDL 46, LDL 169. CBC normal except platelets 129K.  Dated 02/24/17: A1c 7.3%. Iron studies and TFTs normal. AST 90, ALT 128. Other chemistries are normal.  Dated 10/12/17: A1c 6%. Hgb normal. Potassium 5.2. AST 60. TSH normal. Triglycerides 98, HDL 58, LDL 161. Dated 11/29/18: potassium 5.1. creatinine 0.8. glucose 181. AST 97, ALT 108. Cholesterol 252, triglycerides 290, HDL  41, LDL 153. plts 143K. Otherwise CBC normal. Dated 08/14/20: cholesterol 188, triglycerides 163, HDL 46, LDL 113. AST 74, ALT 79. TSH normal. Platelets 138K. Glucose 143, A1c 7.2%. Dated 08/05/21: cholesterol 186, triglycerides 211, HDL 44, LDL 100. Plts 111K otherwise CBC normal. A1c 6.9%. sodium  135. AST and ALT 86. Glucose 170, TSH normal.    ASSESSMENT & PLAN:   CAD s/p CABG 2015. S/p rotational atherectomy and stenting of the RCA in July 2017 (failed SVG to RCA). He is asymptomatic.  Continue medical therapy. Focus on lifestyle modification and increased aerobic activity. Not on BB due to h/o marked sinus bradycardia.  Mild AI - follow clinically. HTN - well controlled.  Hyperlipidemia with chronicaly elevated LFTs - US shows a fatty liver.  On Zetia. Continue dietary modification.Congratulated his efforts at weight loss. Now on Crestor daily. Will have follow up labs with PCP. Thrombocytopenia - no bleeding  Disposition: F/u with me in 6 months.    Signed, Azyriah Nevins Martinique MD, [facility]  01/05/2022 10:09 AM    [place]

## 2022-01-05 ENCOUNTER — Encounter: Payer: Self-pay | Admitting: Cardiology

## 2022-01-05 ENCOUNTER — Other Ambulatory Visit: Payer: Self-pay

## 2022-01-05 ENCOUNTER — Ambulatory Visit: Payer: Medicare PPO | Admitting: Cardiology

## 2022-01-05 VITALS — BP 128/84 | HR 70 | Ht 73.0 in | Wt 209.0 lb

## 2022-01-05 DIAGNOSIS — Z951 Presence of aortocoronary bypass graft: Secondary | ICD-10-CM | POA: Diagnosis not present

## 2022-01-05 DIAGNOSIS — I2510 Atherosclerotic heart disease of native coronary artery without angina pectoris: Secondary | ICD-10-CM

## 2022-01-05 DIAGNOSIS — E7800 Pure hypercholesterolemia, unspecified: Secondary | ICD-10-CM

## 2022-01-05 DIAGNOSIS — I10 Essential (primary) hypertension: Secondary | ICD-10-CM

## 2022-01-20 ENCOUNTER — Encounter: Payer: Medicare PPO | Admitting: Thoracic Surgery (Cardiothoracic Vascular Surgery)

## 2022-01-28 ENCOUNTER — Telehealth: Payer: Self-pay | Admitting: Cardiology

## 2022-01-28 NOTE — Telephone Encounter (Signed)
Spoke to patient Dr.Jordan's advice given.Appointment scheduled with Dr.Jordan 3/29 at 10:30 am.Cardiac cath scheduled 3/31 at 10:30 am.Advised I will give you cath instructions and have pre cath labs at Dr.Jordan's 3/29 appointment. ?

## 2022-01-28 NOTE — Telephone Encounter (Signed)
Spoke to patient he stated he has noticed when he does any strenuous work he gets sob.Stated he can walk 1 to 1&1/2 miles with no sob.Stated he has noticed sob when he does any kind of yard work for the past 2 months..He also has noticed both shoulders are achy at times.No chest pain.He wanted to ask Dr.Jordan if he thinks he needs to have a stress test or a heart cath.Advised I will send message to [place] for advice. ?

## 2022-01-28 NOTE — Telephone Encounter (Signed)
Pt c/o Shortness Of Breath: STAT if SOB developed within the last 24 hours or pt is noticeably SOB on the phone ? ?1. Are you currently SOB (can you hear that pt is SOB on the phone)? No  ? ?2. How long have you been experiencing SOB? About a week  ? ?3. Are you SOB when sitting or when up moving around? Moving around  ? ?4. Are you currently experiencing any other symptoms? Aching shoulders  ? ? ?Patient is requesting a callback from [place]. States he believes he is having symptoms of a blockage again.  ?

## 2022-01-28 NOTE — Telephone Encounter (Signed)
I think he probably needs cardiac cath given his history. Prior CABG and PCI of the RCA in 2017. I don't know that stress testing is going to be very helpful. We saw him on 2/27 so he will probably need a OV and labs but can go ahead and schedule ? ?Aritzel Krusemark Martin[place] MD, [facility] ? ?

## 2022-01-30 NOTE — H&P (View-Only) (Signed)
? ?Cardiology Office Note   ? ?Date:  02/04/2022  ?ID:  Miguel Vasquez, DOB [birth year], MRN [id number] ?PCP:  Audie Box, MD  ?Cardiologist:  Berkeley Veldman Martinique MD ? ?Chief Complaint: f/u CAD ? ?History of Present Illness:  ?Miguel Vasquez is a 76 y.o. male seen for evaluation of DOE and some chest pain. He has a history of CAD (stent to Cx 1996, stent to RCA 2002, s/p CABGx3 in 2015, rotational atherectomy/DESx2 to RCA 05/2017), HLD (intolerant of statins due to elevated liver/muscle enzymes), HTN, DM, hemochromatosis, cervical disc disease, panic disorder, hypothyroidism, h/o marked sinus bradycardia (BB stopped)  ? ?Last 2D Echo 09/2014: mild-mod LVH, EF 55-60%, cannot exclude RWMA, mild AI. This past summer he was complaining of atypical chest pain but this was similar to what prompted CABG. Nuc was abnormal so he underwent diagnostic cath 05/11/16 showing severe, diffuse calcific disease and a very large dominant RCA - essentially, chronic occlusion at the ostium. There were brisk left to right collaterals. He had occluded SVG-OM. 50% mLAD, patent LIMA-LAD. LVEF and LVEDP were normal. He was brought back for staged PCI with successful rotational atherectomy and stenting of the ostial to mid RCA with DES x 2. DAPT was recommended indefinitely.  We had also checked an echo 2/42/35 for systolic murmur showing mod LVH, EF 60-65%, grade 1 DD, mild AI. ? ?He was seen in February and doing OK at that time but fairly inactive. 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 Some tightness. No pain. Did not have typical angina before. He was concerned. No pain or SOB now. He is generally pretty sedentary. He has been taking his Crestor daily for the last 1-2 months. ? ? ? ?Past Medical History:  ?Diagnosis Date  ? Aortic insufficiency   ? a. Mild AI by echo 04/2016.  ? Basal cell carcinoma, face   ? Coronary disease   ? a. stent to Cx 1996. b. stent to RCA 2002. c. s/p CABGx3  in 2015. d.  rotational atherectomy and DESx2 to ostial to mid RCA.  ? Elevated liver enzymes   ? GERD (gastroesophageal reflux disease)   ? H. pylori infection tx'd in the 1970s  ? Hemochromatosis   ? History of gout 1990s X 1  ? Hypercholesterolemia   ? Hypertension   ? Controlled  ? Hypothyroid   ? Myocardial infarction [facility]) 2002  ? "mild"  ? Obesity   ? Panic disorder   ? Hx of panic disorder  ? S/P primary angioplasty with coronary stent and rotational atherectomy of ostial mRCA with DES X 2-- 7/11/7 05/20/2016  ? Sinus bradycardia   ? a. h/o marked sinus bradycardia, BB stopped.  ? Tachycardia   ? a. Noted on stress test strips in 2017. ? Atrial flutter. Further w/u underway.  ? Thrombocytopenia ([place])   ? a. per review of labs.  ? Type II diabetes mellitus ([place])   ? ? ?Past Surgical History:  ?Procedure Laterality Date  ? BASAL CELL CARCINOMA EXCISION    ? "face & lips"  ? CARDIAC CATHETERIZATION  11//2015  ? CARDIAC CATHETERIZATION N/A 05/11/2016  ? Procedure: Left Heart Cath and Cors/Grafts Angiography;  Surgeon: Jettie Booze, MD;  Location: [place] [facility];  Service: Cardiovascular;  Laterality: N/A;  ? CARDIAC CATHETERIZATION N/A 05/19/2016  ? Procedure: Coronary/Graft Atherectomy;  Surgeon: Arda Daggs M Martinique, MD;  Location: [place] [facility];  Service: Cardiovascular;  Laterality: N/A;  ? CARDIAC CATHETERIZATION N/A 05/19/2016  ? Procedure: Coronary Stent Intervention;  Surgeon: Saaya Procell M Martinique, MD;  Location: [place] [facility];  Service: Cardiovascular;  Laterality: N/A;  ? CATARACT EXTRACTION W/ INTRAOCULAR LENS  IMPLANT, BILATERAL Bilateral 2016  ? CORONARY ANGIOPLASTY WITH STENT PLACEMENT  1996; 2002; 05/19/2016  ? "1 + 2 +2"  ? CORONARY ARTERY BYPASS GRAFT N/A 10/03/2014  ? Procedure: CORONARY ARTERY BYPASS GRAFTING (CABG) x  three, using left internal mammary artery and right leg greater saphenous vein harvested endoscopically;  Surgeon: Melrose Nakayama, MD;  Location: [place];  Service: Open  Heart Surgery;  Laterality: N/A;  ? LEFT HEART CATHETERIZATION WITH CORONARY ANGIOGRAM N/A 09/28/2014  ? Procedure: LEFT HEART CATHETERIZATION WITH CORONARY ANGIOGRAM;  Surgeon: [place] Sine, MD;  Location: [facility] C[facility] LAB;  Service: Cardiovascular;  Laterality: N/A;  ? TEE WITHOUT CARDIOVERSION N/A 10/03/2014  ? Procedure: TRANSESOPHAGEAL ECHOCARDIOGRAM (TEE);  Surgeon: Melrose Nakayama, MD;  Location: [place];  Service: Open Heart Surgery;  Laterality: N/A;  ? TONSILLECTOMY  1950s  ? TRIGGER FINGER RELEASE Bilateral 1990s-2000s  ? ? ?Current Medications: ?Current Outpatient Medications  ?Medication Sig Dispense Refill  ? allopurinol (ZYLOPRIM) 300 MG tablet Take 150 mg by mouth in the morning.    ? aspirin 81 MG EC tablet Take 81 mg by mouth every evening.    ? Cholecalciferol (VITAMIN D3) 25 MCG (1000 UT) CAPS Take 1,000 Units by mouth every evening.    ? clopidogrel (PLAVIX) 75 MG tablet Take 1 tablet by mouth once daily 90 tablet 0  ? ezetimibe (ZETIA) 10 MG tablet Take 10 mg by mouth daily.      ? folic acid (FOLVITE) 357 MCG tablet Take 400 mcg by mouth in the morning.    ? glipiZIDE (GLUCOTROL XL) 5 MG 24 hr tablet Take 5 mg by mouth daily with breakfast.    ? levothyroxine (SYNTHROID, LEVOTHROID) 100 MCG tablet Take 100 mcg by mouth daily.      ? metFORMIN (GLUCOPHAGE) 500 MG tablet Take 1,000 mg by mouth in the morning and at bedtime. Take 2 tablets by mouth 2 times daily with meal.    ? nitroGLYCERIN (NITROSTAT) 0.4 MG SL tablet Place 0.4 mg under the tongue every 5 (five) minutes as needed for chest pain. DISSOLVE ONE TABLET UNDER THE TONGUE EVERY 5 MINUTES AS NEEDED FOR CHEST PAIN.  DO NOT EXCEED A TOTAL OF 3 DOSES IN 15 MINUTES    ? omega-3 acid ethyl esters (LOVAZA) 1 g capsule Take 2 g by mouth 2 (two) times daily.    ? pantoprazole (PROTONIX) 40 MG tablet Take 1 tablet (40 mg total) by mouth daily. (Patient taking differently: Take 40 mg by mouth every evening.) 30 tablet 11  ? ramipril (ALTACE) 10  MG capsule Take 20 mg by mouth in the morning.    ? rosuvastatin (CRESTOR) 10 MG tablet Take 10 mg by mouth in the morning.    ? ?No current facility-administered medications for this visit.  ?  ? ?Allergies:   Statins  ? ?Social History  ? ?Socioeconomic History  ? Marital status: Married  ?  Spouse name: Not on file  ? Number of children: 1  ? Years of education: Not on file  ? Highest education level: Not on file  ?Occupational History  ? Occupation: [employer] consultant [employer]  ?Tobacco Use  ? Smoking status: Former  ?  Packs/day: 1.00  ?  Years: 20.00  ?  Pack years: 20.00  ?  Types: Cigarettes  ?  Start date: 09/20/1995  ?  Quit date: 09/24/1995  ?  Years since quitting: 26.3  ? Smokeless tobacco: Never  ?Substance and Sexual Activity  ? Alcohol use: Yes  ?  Alcohol/week: 6.0 standard drinks  ?  Types: 6 Cans of beer per week  ? Drug use: No  ? Sexual activity: Yes  ?Other Topics Concern  ? Not on file  ?Social History Narrative  ? Not on file  ? ?Social Determinants of Health  ? ?Financial Resource Strain: Not on file  ?Food Insecurity: Not on file  ?Transportation Needs: Not on file  ?Physical Activity: Not on file  ?Stress: Not on file  ?Social Connections: Not on file  ?  ? ?Family History:  The patient's family history includes Colon cancer in his brother and mother; GI problems in his father; Heart attack in his sister; Heart failure in his mother.  ? ?ROS:   ?Please see the history of present illness.   ?All other systems are reviewed and otherwise negative.  ? ? ?PHYSICAL EXAM:   ?VS:  BP (!) 146/82   Pulse 66   Ht '6\' 1"'$  (1.854 m)   Wt 212 lb (96.2 kg)   SpO2 98%   BMI 27.97 kg/m?   BMI: Body mass index is 27.97 kg/m?. ?GENERAL:  Well appearing WM in NAD ?HEENT:  PERRL, EOMI, sclera are clear. Oropharynx is clear. ?NECK:  No jugular venous distention, carotid upstroke brisk and symmetric, no bruits, no thyromegaly or adenopathy ?LUNGS:  Clear to auscultation bilaterally ?CHEST:  Unremarkable ?HEART:  RRR,  PMI  not displaced or sustained,S1 and S2 within normal limits, no S3, no S4: no clicks, no rubs, no murmurs ?ABD:  Soft, nontender. BS +, no masses or bruits. No hepatomegaly, no splenomegaly ?EXT:  2 + pulse

## 2022-01-30 NOTE — Progress Notes (Signed)
? ?Cardiology Office Note   ? ?Date:  02/04/2022  ?ID:  Miguel Vasquez, DOB 17-Feb-1946, MRN [id number] ?PCP:  Audie Box, MD  ?Cardiologist:  Edwing Figley Martinique MD ? ?Chief Complaint: f/u CAD ? ?History of Present Illness:  ?Miguel Vasquez is a 76 y.o. male seen for evaluation of DOE and some chest pain. He has a history of CAD (stent to Cx 1996, stent to RCA 2002, s/p CABGx3 in 2015, rotational atherectomy/DESx2 to RCA 05/2017), HLD (intolerant of statins due to elevated liver/muscle enzymes), HTN, DM, hemochromatosis, cervical disc disease, panic disorder, hypothyroidism, h/o marked sinus bradycardia (BB stopped)  ? ?Last 2D Echo 09/2014: mild-mod LVH, EF 55-60%, cannot exclude RWMA, mild AI. This past summer he was complaining of atypical chest pain but this was similar to what prompted CABG. Nuc was abnormal so he underwent diagnostic cath 05/11/16 showing severe, diffuse calcific disease and a very large dominant RCA - essentially, chronic occlusion at the ostium. There were brisk left to right collaterals. He had occluded SVG-OM. 50% mLAD, patent LIMA-LAD. LVEF and LVEDP were normal. He was brought back for staged PCI with successful rotational atherectomy and stenting of the ostial to mid RCA with DES x 2. DAPT was recommended indefinitely.  We had also checked an echo 2/59/56 for systolic murmur showing mod LVH, EF 60-65%, grade 1 DD, mild AI. ? ?He was seen in February and doing OK at that time but fairly inactive. 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 Some tightness. No pain. Did not have typical angina before. He was concerned. No pain or SOB now. He is generally pretty sedentary. He has been taking his Crestor daily for the last 1-2 months. ? ? ? ?Past Medical History:  ?Diagnosis Date  ? Aortic insufficiency   ? a. Mild AI by echo 04/2016.  ? Basal cell carcinoma, face   ? Coronary disease   ? a. stent to Cx 1996. b. stent to RCA 2002. c. s/p CABGx3  in 2015. d.  rotational atherectomy and DESx2 to ostial to mid RCA.  ? Elevated liver enzymes   ? GERD (gastroesophageal reflux disease)   ? H. pylori infection tx'd in the 1970s  ? Hemochromatosis   ? History of gout 1990s X 1  ? Hypercholesterolemia   ? Hypertension   ? Controlled  ? Hypothyroid   ? Myocardial infarction [facility]) 2002  ? "mild"  ? Obesity   ? Panic disorder   ? Hx of panic disorder  ? S/P primary angioplasty with coronary stent and rotational atherectomy of ostial mRCA with DES X 2-- 7/11/7 05/20/2016  ? Sinus bradycardia   ? a. h/o marked sinus bradycardia, BB stopped.  ? Tachycardia   ? a. Noted on stress test strips in 2017. ? Atrial flutter. Further w/u underway.  ? Thrombocytopenia ([place])   ? a. per review of labs.  ? Type II diabetes mellitus ([place])   ? ? ?Past Surgical History:  ?Procedure Laterality Date  ? BASAL CELL CARCINOMA EXCISION    ? "face & lips"  ? CARDIAC CATHETERIZATION  11//2015  ? CARDIAC CATHETERIZATION N/A 05/11/2016  ? Procedure: Left Heart Cath and Cors/Grafts Angiography;  Surgeon: Jettie Booze, MD;  Location: [place] [facility];  Service: Cardiovascular;  Laterality: N/A;  ? CARDIAC CATHETERIZATION N/A 05/19/2016  ? Procedure: Coronary/Graft Atherectomy;  Surgeon: Dandrea Medders M Martinique, MD;  Location: [place] [facility];  Service: Cardiovascular;  Laterality: N/A;  ? CARDIAC CATHETERIZATION N/A 05/19/2016  ? Procedure: Coronary Stent Intervention;  Surgeon: Tunisha Ruland M Martinique, MD;  Location: [place] [facility];  Service: Cardiovascular;  Laterality: N/A;  ? CATARACT EXTRACTION W/ INTRAOCULAR LENS  IMPLANT, BILATERAL Bilateral 2016  ? CORONARY ANGIOPLASTY WITH STENT PLACEMENT  1996; 2002; 05/19/2016  ? "1 + 2 +2"  ? CORONARY ARTERY BYPASS GRAFT N/A 10/03/2014  ? Procedure: CORONARY ARTERY BYPASS GRAFTING (CABG) x  three, using left internal mammary artery and right leg greater saphenous vein harvested endoscopically;  Surgeon: Melrose Nakayama, MD;  Location: [place];  Service: Open  Heart Surgery;  Laterality: N/A;  ? LEFT HEART CATHETERIZATION WITH CORONARY ANGIOGRAM N/A 09/28/2014  ? Procedure: LEFT HEART CATHETERIZATION WITH CORONARY ANGIOGRAM;  Surgeon: [place] Sine, MD;  Location[facility] C[facility] LAB;  Service: Cardiovascular;  Laterality: N/A;  ? TEE WITHOUT CARDIOVERSION N/A 10/03/2014  ? Procedure: TRANSESOPHAGEAL ECHOCARDIOGRAM (TEE);  Surgeon: Melrose Nakayama, MD;  Location: [place];  Service: Open Heart Surgery;  Laterality: N/A;  ? TONSILLECTOMY  1950s  ? TRIGGER FINGER RELEASE Bilateral 1990s-2000s  ? ? ?Current Medications: ?Current Outpatient Medications  ?Medication Sig Dispense Refill  ? allopurinol (ZYLOPRIM) 300 MG tablet Take 150 mg by mouth in the morning.    ? aspirin 81 MG EC tablet Take 81 mg by mouth every evening.    ? Cholecalciferol (VITAMIN D3) 25 MCG (1000 UT) CAPS Take 1,000 Units by mouth every evening.    ? clopidogrel (PLAVIX) 75 MG tablet Take 1 tablet by mouth once daily 90 tablet 0  ? ezetimibe (ZETIA) 10 MG tablet Take 10 mg by mouth daily.      ? folic acid (FOLVITE) 440 MCG tablet Take 400 mcg by mouth in the morning.    ? glipiZIDE (GLUCOTROL XL) 5 MG 24 hr tablet Take 5 mg by mouth daily with breakfast.    ? levothyroxine (SYNTHROID, LEVOTHROID) 100 MCG tablet Take 100 mcg by mouth daily.      ? metFORMIN (GLUCOPHAGE) 500 MG tablet Take 1,000 mg by mouth in the morning and at bedtime. Take 2 tablets by mouth 2 times daily with meal.    ? nitroGLYCERIN (NITROSTAT) 0.4 MG SL tablet Place 0.4 mg under the tongue every 5 (five) minutes as needed for chest pain. DISSOLVE ONE TABLET UNDER THE TONGUE EVERY 5 MINUTES AS NEEDED FOR CHEST PAIN.  DO NOT EXCEED A TOTAL OF 3 DOSES IN 15 MINUTES    ? omega-3 acid ethyl esters (LOVAZA) 1 g capsule Take 2 g by mouth 2 (two) times daily.    ? pantoprazole (PROTONIX) 40 MG tablet Take 1 tablet (40 mg total) by mouth daily. (Patient taking differently: Take 40 mg by mouth every evening.) 30 tablet 11  ? ramipril (ALTACE) 10  MG capsule Take 20 mg by mouth in the morning.    ? rosuvastatin (CRESTOR) 10 MG tablet Take 10 mg by mouth in the morning.    ? ?No current facility-administered medications for this visit.  ?  ? ?Allergies:   Statins  ? ?Social History  ? ?Socioeconomic History  ? Marital status: Married  ?  Spouse name: Not on file  ? Number of children: 1  ? Years of education: Not on file  ? Highest education level: Not on file  ?Occupational History  ? Occupation: Agricultural consultant [employer]  ?Tobacco Use  ? Smoking status: Former  ?  Packs/day: 1.00  ?  Years: 20.00  ?  Pack years: 20.00  ?  Types: Cigarettes  ?  Start date: 09/20/1995  ?  Quit date: 09/24/1995  ?  Years since quitting: 26.3  ? Smokeless tobacco: Never  ?Substance and Sexual Activity  ? Alcohol use: Yes  ?  Alcohol/week: 6.0 standard drinks  ?  Types: 6 Cans of beer per week  ? Drug use: No  ? Sexual activity: Yes  ?Other Topics Concern  ? Not on file  ?Social History Narrative  ? Not on file  ? ?Social Determinants of Health  ? ?Financial Resource Strain: Not on file  ?Food Insecurity: Not on file  ?Transportation Needs: Not on file  ?Physical Activity: Not on file  ?Stress: Not on file  ?Social Connections: Not on file  ?  ? ?Family History:  The patient's family history includes Colon cancer in his brother and mother; GI problems in his father; Heart attack in his sister; Heart failure in his mother.  ? ?ROS:   ?Please see the history of present illness.   ?All other systems are reviewed and otherwise negative.  ? ? ?PHYSICAL EXAM:   ?VS:  BP (!) 146/82   Pulse 66   Ht '6\' 1"'$  (1.854 m)   Wt 212 lb (96.2 kg)   SpO2 98%   BMI 27.97 kg/m?   BMI: Body mass index is 27.97 kg/m?. ?GENERAL:  Well appearing WM in NAD ?HEENT:  PERRL, EOMI, sclera are clear. Oropharynx is clear. ?NECK:  No jugular venous distention, carotid upstroke brisk and symmetric, no bruits, no thyromegaly or adenopathy ?LUNGS:  Clear to auscultation bilaterally ?CHEST:  Unremarkable ?HEART:  RRR,  PMI  not displaced or sustained,S1 and S2 within normal limits, no S3, no S4: no clicks, no rubs, no murmurs ?ABD:  Soft, nontender. BS +, no masses or bruits. No hepatomegaly, no splenomegaly ?EXT:  2 + pulse

## 2022-02-04 ENCOUNTER — Ambulatory Visit: Payer: Medicare PPO | Admitting: Cardiology

## 2022-02-04 ENCOUNTER — Other Ambulatory Visit: Payer: Self-pay | Admitting: Cardiology

## 2022-02-04 ENCOUNTER — Encounter: Payer: Self-pay | Admitting: Cardiology

## 2022-02-04 ENCOUNTER — Other Ambulatory Visit: Payer: Self-pay

## 2022-02-04 VITALS — BP 146/82 | HR 66 | Ht 73.0 in | Wt 212.0 lb

## 2022-02-04 DIAGNOSIS — E7800 Pure hypercholesterolemia, unspecified: Secondary | ICD-10-CM

## 2022-02-04 DIAGNOSIS — E119 Type 2 diabetes mellitus without complications: Secondary | ICD-10-CM

## 2022-02-04 DIAGNOSIS — I10 Essential (primary) hypertension: Secondary | ICD-10-CM | POA: Diagnosis not present

## 2022-02-04 DIAGNOSIS — I25708 Atherosclerosis of coronary artery bypass graft(s), unspecified, with other forms of angina pectoris: Secondary | ICD-10-CM

## 2022-02-04 MED ORDER — SODIUM CHLORIDE 0.9% FLUSH: 3.0000 mL | Freq: Two times a day (BID) | INTRAVENOUS | Status: DC

## 2022-02-04 NOTE — Patient Instructions (Signed)
Medication Instructions:  ?Continue same medications ?*If you need a refill on your cardiac medications before your next appointment, please call your pharmacy* ? ? ?Lab Work: bmet,cbc,pt,lipid and hepatic panels a1c  today ? ? ? ?Testing/Procedures: ?Cardiac Cath scheduled Friday 02/06/22 at [facility] hospital  Follow instructions below ? ? ?Follow-Up: ?At [facility], you and your health needs are our priority.  As part of our continuing mission to provide you with exceptional heart care, we have created designated Provider Care Teams.  These Care Teams include your primary Cardiologist (physician) and Advanced Practice Providers (APPs -  Physician Assistants and Nurse Practitioners) who all work together to provide you with the care you need, when you need it. ? ?We recommend signing up for the patient portal called "MyChart".  Sign up information is provided on this After Visit Summary.  MyChart is used to connect with patients for Virtual Visits (Telemedicine).  Patients are able to view lab/test results, encounter notes, upcoming appointments, etc.  Non-urgent messages can be sent to your provider as well.   ?To learn more about what you can do with MyChart, go to [email or web address].   ? ?Your next appointment:  Wed 02/18/22 at 9:30 am ?  ? ?The format for your next appointment:  Office  ? ? ?Provider:  Dr.Jordan ? ? ? ?[place] [address] ?[place] [address] ?Dept: [phone] ?Loc: [phone] ? ?Miguel Vasquez  02/04/2022 ? ?You are scheduled for a Cardiac Catheterization on Friday, March 31 with Dr. Peter Martinique. ? ?1. Please arrive at the Main Entrance A at [facility]: [place], [place] [address] at 8:30 AM (This time is two hours before your procedure to ensure your preparation). Free valet parking service is available.  ? ?Special note: Every effort is made to have your procedure done  on time. Please understand that emergencies sometimes delay scheduled procedures. ? ?2. Diet: Do not eat solid foods after midnight.  You may have clear liquids until 5 AM upon the day of the procedure. ? ?3. Labs: You will need to have blood drawn today 3/29 , at  [place] office.  ? ?4. Medication instructions in preparation for your procedure: ? ? ?Hold Glipizide morning of cath ?Hold Metformin morning of cath and Hold 2 days after cath ? ? ? ? ? ? ? ? ? ? ? ?On the morning of your procedure, take Aspirin and Plavix/Clopidogrel and any morning medicines NOT listed above.  You may use sips of water. ? ?5. Plan to go home the same day, you will only stay overnight if medically necessary. ?6. You MUST have a responsible adult to drive you home. ?7. An adult MUST be with you the first 24 hours after you arrive home. ?8. Bring a current list of your medications, and the last time and date medication taken. ?9. Bring ID and current insurance cards. ?10.Please wear clothes that are easy to get on and off and wear slip-on shoes. ? ?Thank you for allowing [place] to care for you! ?  -- [facility] ? ? ?

## 2022-02-05 ENCOUNTER — Telehealth: Payer: Self-pay | Admitting: *Deleted

## 2022-02-05 LAB — CBC WITH DIFFERENTIAL/PLATELET
Basophils Absolute: 0 10*3/uL (ref 0.0–0.2)
Basos: 1 %
EOS (ABSOLUTE): 0.1 10*3/uL (ref 0.0–0.4)
Eos: 1 %
Hematocrit: 41.3 % (ref 37.5–51.0)
Hemoglobin: 14 g/dL (ref 13.0–17.7)
Immature Grans (Abs): 0 10*3/uL (ref 0.0–0.1)
Immature Granulocytes: 0 %
Lymphocytes Absolute: 1.4 10*3/uL (ref 0.7–3.1)
Lymphs: 26 %
MCH: 30.3 pg (ref 26.6–33.0)
MCHC: 33.9 g/dL (ref 31.5–35.7)
MCV: 89 fL (ref 79–97)
Monocytes Absolute: 0.3 10*3/uL (ref 0.1–0.9)
Monocytes: 7 %
Neutrophils Absolute: 3.4 10*3/uL (ref 1.4–7.0)
Neutrophils: 65 %
Platelets: 132 10*3/uL — ABNORMAL LOW (ref 150–450)
RBC: 4.62 x10E6/uL (ref 4.14–5.80)
RDW: 12.2 % (ref 11.6–15.4)
WBC: 5.2 10*3/uL (ref 3.4–10.8)

## 2022-02-05 LAB — BASIC METABOLIC PANEL
BUN/Creatinine Ratio: 19 (ref 10–24)
BUN: 14 mg/dL (ref 8–27)
CO2: 20 mmol/L (ref 20–29)
Calcium: 9.8 mg/dL (ref 8.6–10.2)
Chloride: 100 mmol/L (ref 96–106)
Creatinine, Ser: 0.75 mg/dL — ABNORMAL LOW (ref 0.76–1.27)
Glucose: 178 mg/dL — ABNORMAL HIGH (ref 70–99)
Potassium: 4.1 mmol/L (ref 3.5–5.2)
Sodium: 138 mmol/L (ref 134–144)
eGFR: 95 mL/min/{1.73_m2} (ref >59)

## 2022-02-05 LAB — HEPATIC FUNCTION PANEL
ALT: 41 IU/L (ref 0–44)
AST: 43 IU/L — ABNORMAL HIGH (ref 0–40)
Albumin: 4.6 g/dL (ref 3.7–4.7)
Alkaline Phosphatase: 79 IU/L (ref 44–121)
Bilirubin Total: 0.9 mg/dL (ref 0.0–1.2)
Bilirubin, Direct: 0.24 mg/dL (ref 0.00–0.40)
Total Protein: 6.6 g/dL (ref 6.0–8.5)

## 2022-02-05 LAB — LIPID PANEL
Chol/HDL Ratio: 2.5 ratio (ref 0.0–5.0)
Cholesterol, Total: 130 mg/dL (ref 100–199)
HDL: 53 mg/dL (ref >39)
LDL Chol Calc (NIH): 52 mg/dL (ref 0–99)
Triglycerides: 146 mg/dL (ref 0–149)
VLDL Cholesterol Cal: 25 mg/dL (ref 5–40)

## 2022-02-05 LAB — PT AND PTT
INR: 1.2 (ref 0.9–1.2)
Prothrombin Time: 12.4 s — ABNORMAL HIGH (ref 9.1–12.0)
aPTT: 29 s (ref 24–33)

## 2022-02-05 LAB — HEMOGLOBIN A1C
Est. average glucose Bld gHb Est-mCnc: 171 mg/dL
Hgb A1c MFr Bld: 7.6 % — ABNORMAL HIGH (ref 4.8–5.6)

## 2022-02-05 NOTE — Telephone Encounter (Signed)
Cardiac Catheterization scheduled at [facility] for: Friday February 06, 2022 10:30 AM ?Arrival time and place: [place] Entrance A at: 8:30 AM ? ? ?No solid food after midnight prior to cath, clear liquids until 5 AM day of procedure. ? ?Medication instructions: ?-Hold:  ? Metformin-day of procedure and 48 post procedure ? Glipizide-AM of procedure ?-Except hold medications usual morning medications can be taken with sips of water including aspirin 81 mg and Plavix 75 mg. ? ?Confirmed patient has responsible adult to drive home post procedure and be with patient first 24 hours after arriving home. ? ?Patient reports no new symptoms concerning for COVID-19/no exposure to COVID-19 in the past 10 days. ? ?Reviewed procedure instructions with patient.  ? ?

## 2022-02-06 ENCOUNTER — Encounter (HOSPITAL_COMMUNITY)
Admission: RE | Disposition: A | Discharge status: Home / Self Care | Payer: Medicare PPO | Source: Home / Self Care | Attending: Cardiology

## 2022-02-06 ENCOUNTER — Other Ambulatory Visit: Payer: Self-pay

## 2022-02-06 ENCOUNTER — Ambulatory Visit (HOSPITAL_BASED_OUTPATIENT_CLINIC_OR_DEPARTMENT_OTHER): Payer: Medicare PPO

## 2022-02-06 ENCOUNTER — Ambulatory Visit (HOSPITAL_COMMUNITY)
Admission: RE | Admit: 2022-02-06 | Discharge: 2022-02-06 | Disposition: A | Discharge status: Home / Self Care | Payer: Medicare PPO | Attending: Cardiology | Admitting: Cardiology

## 2022-02-06 DIAGNOSIS — Z7984 Long term (current) use of oral hypoglycemic drugs: Secondary | ICD-10-CM | POA: Insufficient documentation

## 2022-02-06 DIAGNOSIS — Z955 Presence of coronary angioplasty implant and graft: Secondary | ICD-10-CM | POA: Insufficient documentation

## 2022-02-06 DIAGNOSIS — Z79899 Other long term (current) drug therapy: Secondary | ICD-10-CM | POA: Insufficient documentation

## 2022-02-06 DIAGNOSIS — Z7902 Long term (current) use of antithrombotics/antiplatelets: Secondary | ICD-10-CM | POA: Diagnosis not present

## 2022-02-06 DIAGNOSIS — I252 Old myocardial infarction: Secondary | ICD-10-CM | POA: Insufficient documentation

## 2022-02-06 DIAGNOSIS — I2582 Chronic total occlusion of coronary artery: Secondary | ICD-10-CM | POA: Diagnosis not present

## 2022-02-06 DIAGNOSIS — E119 Type 2 diabetes mellitus without complications: Secondary | ICD-10-CM

## 2022-02-06 DIAGNOSIS — I25119 Atherosclerotic heart disease of native coronary artery with unspecified angina pectoris: Secondary | ICD-10-CM

## 2022-02-06 DIAGNOSIS — I119 Hypertensive heart disease without heart failure: Secondary | ICD-10-CM | POA: Diagnosis not present

## 2022-02-06 DIAGNOSIS — I2510 Atherosclerotic heart disease of native coronary artery without angina pectoris: Secondary | ICD-10-CM | POA: Diagnosis not present

## 2022-02-06 DIAGNOSIS — D696 Thrombocytopenia, unspecified: Secondary | ICD-10-CM | POA: Diagnosis not present

## 2022-02-06 DIAGNOSIS — Z87891 Personal history of nicotine dependence: Secondary | ICD-10-CM | POA: Diagnosis not present

## 2022-02-06 DIAGNOSIS — Z7982 Long term (current) use of aspirin: Secondary | ICD-10-CM | POA: Diagnosis not present

## 2022-02-06 DIAGNOSIS — Z79890 Hormone replacement therapy (postmenopausal): Secondary | ICD-10-CM | POA: Insufficient documentation

## 2022-02-06 DIAGNOSIS — I25719 Atherosclerosis of autologous vein coronary artery bypass graft(s) with unspecified angina pectoris: Secondary | ICD-10-CM | POA: Diagnosis not present

## 2022-02-06 DIAGNOSIS — Z951 Presence of aortocoronary bypass graft: Secondary | ICD-10-CM

## 2022-02-06 DIAGNOSIS — I25709 Atherosclerosis of coronary artery bypass graft(s), unspecified, with unspecified angina pectoris: Secondary | ICD-10-CM | POA: Diagnosis not present

## 2022-02-06 DIAGNOSIS — E039 Hypothyroidism, unspecified: Secondary | ICD-10-CM | POA: Diagnosis not present

## 2022-02-06 DIAGNOSIS — I209 Angina pectoris, unspecified: Secondary | ICD-10-CM | POA: Diagnosis present

## 2022-02-06 DIAGNOSIS — I351 Nonrheumatic aortic (valve) insufficiency: Secondary | ICD-10-CM | POA: Insufficient documentation

## 2022-02-06 DIAGNOSIS — I25708 Atherosclerosis of coronary artery bypass graft(s), unspecified, with other forms of angina pectoris: Secondary | ICD-10-CM

## 2022-02-06 DIAGNOSIS — E7800 Pure hypercholesterolemia, unspecified: Secondary | ICD-10-CM | POA: Diagnosis present

## 2022-02-06 DIAGNOSIS — R0602 Shortness of breath: Secondary | ICD-10-CM | POA: Insufficient documentation

## 2022-02-06 DIAGNOSIS — I10 Essential (primary) hypertension: Secondary | ICD-10-CM | POA: Diagnosis present

## 2022-02-06 HISTORY — PX: LEFT HEART CATH AND CORS/GRAFTS ANGIOGRAPHY: CATH118250

## 2022-02-06 LAB — ECHOCARDIOGRAM COMPLETE
AR max vel: 2.13 cm2
AV Peak grad: 13.2 mmHg
Ao pk vel: 1.82 m/s
Area-P 1/2: 2.62 cm2
Calc EF: 56.8 %
Height: 73 in
P 1/2 time: 541 msec
S' Lateral: 4 cm
Single Plane A2C EF: 59.8 %
Single Plane A4C EF: 56.7 %
Weight: 3392 oz

## 2022-02-06 LAB — GLUCOSE, CAPILLARY
Glucose-Capillary: 182 mg/dL — ABNORMAL HIGH (ref 70–99)
Glucose-Capillary: 149 mg/dL — ABNORMAL HIGH (ref 70–99)

## 2022-02-06 SURGERY — LEFT HEART CATH AND CORS/GRAFTS ANGIOGRAPHY
Anesthesia: LOCAL

## 2022-02-06 MED ORDER — HYDRALAZINE HCL 20 MG/ML IJ SOLN
10.0000 mg | Freq: Four times a day (QID) | INTRAMUSCULAR | Status: DC | PRN
  Filled 2022-02-06: qty 1

## 2022-02-06 MED ORDER — MIDAZOLAM HCL 2 MG/2ML IJ SOLN
INTRAMUSCULAR | Status: AC
  Filled 2022-02-06: qty 2

## 2022-02-06 MED ORDER — SODIUM CHLORIDE 0.9% FLUSH: 3.0000 mL | INTRAVENOUS | Status: DC | PRN

## 2022-02-06 MED ORDER — HEPARIN SODIUM (PORCINE) 1000 UNIT/ML IJ SOLN
INTRAMUSCULAR | Status: DC | PRN
  Administered 2022-02-06: 5000 [IU] via INTRAVENOUS

## 2022-02-06 MED ORDER — IOHEXOL 350 MG/ML SOLN
INTRAVENOUS | Status: DC | PRN
  Administered 2022-02-06: 150 mL

## 2022-02-06 MED ORDER — FENTANYL CITRATE (PF) 100 MCG/2ML IJ SOLN
INTRAMUSCULAR | Status: AC
Start: 2022-02-06 — End: ?
  Filled 2022-02-06: qty 2

## 2022-02-06 MED ORDER — FENTANYL CITRATE (PF) 100 MCG/2ML IJ SOLN
INTRAMUSCULAR | Status: DC | PRN
  Administered 2022-02-06: 25 ug via INTRAVENOUS

## 2022-02-06 MED ORDER — METFORMIN HCL 500 MG PO TABS: 1000.0000 mg | ORAL_TABLET | Freq: Two times a day (BID) | ORAL | Status: AC

## 2022-02-06 MED ORDER — SODIUM CHLORIDE 0.9% FLUSH: 3.0000 mL | Freq: Two times a day (BID) | INTRAVENOUS | Status: DC

## 2022-02-06 MED ORDER — PERFLUTREN LIPID MICROSPHERE
1.0000 mL | INTRAVENOUS | Status: DC | PRN
  Administered 2022-02-06: 2 mL via INTRAVENOUS
  Filled 2022-02-06: qty 10

## 2022-02-06 MED ORDER — VERAPAMIL HCL 2.5 MG/ML IV SOLN
INTRAVENOUS | Status: DC | PRN
  Administered 2022-02-06: 10 mL via INTRA_ARTERIAL

## 2022-02-06 MED ORDER — MIDAZOLAM HCL 2 MG/2ML IJ SOLN
INTRAMUSCULAR | Status: DC | PRN
  Administered 2022-02-06: 2 mg via INTRAVENOUS

## 2022-02-06 MED ORDER — LIDOCAINE HCL (PF) 1 % IJ SOLN
INTRAMUSCULAR | Status: DC | PRN
  Administered 2022-02-06: 2 mL

## 2022-02-06 MED ORDER — SODIUM CHLORIDE 0.9 % IV SOLN: 250.0000 mL | INTRAVENOUS | Status: DC | PRN

## 2022-02-06 MED ORDER — SODIUM CHLORIDE 0.9 % WEIGHT BASED INFUSION: 1.0000 mL/kg/h | INTRAVENOUS | Status: DC

## 2022-02-06 MED ORDER — HEPARIN (PORCINE) IN NACL 1000-0.9 UT/500ML-% IV SOLN
INTRAVENOUS | Status: DC | PRN
  Administered 2022-02-06 (×2): 500 mL

## 2022-02-06 MED ORDER — ASPIRIN 81 MG PO CHEW: 81.0000 mg | CHEWABLE_TABLET | ORAL | Status: DC

## 2022-02-06 MED ORDER — ACETAMINOPHEN 325 MG PO TABS: 650.0000 mg | ORAL_TABLET | ORAL | Status: DC | PRN

## 2022-02-06 MED ORDER — LIDOCAINE HCL (PF) 1 % IJ SOLN
INTRAMUSCULAR | Status: AC
  Filled 2022-02-06: qty 30

## 2022-02-06 MED ORDER — VERAPAMIL HCL 2.5 MG/ML IV SOLN
INTRAVENOUS | Status: AC
  Filled 2022-02-06: qty 2

## 2022-02-06 MED ORDER — SODIUM CHLORIDE 0.9 % WEIGHT BASED INFUSION: 1.0000 mL/kg/h | INTRAVENOUS | Status: AC

## 2022-02-06 MED ORDER — HEPARIN (PORCINE) IN NACL 1000-0.9 UT/500ML-% IV SOLN
INTRAVENOUS | Status: AC
  Filled 2022-02-06: qty 1000

## 2022-02-06 MED ORDER — HEPARIN SODIUM (PORCINE) 1000 UNIT/ML IJ SOLN
INTRAMUSCULAR | Status: AC
  Filled 2022-02-06: qty 10

## 2022-02-06 MED ORDER — ONDANSETRON HCL 4 MG/2ML IJ SOLN: 4.0000 mg | Freq: Four times a day (QID) | INTRAMUSCULAR | Status: DC | PRN

## 2022-02-06 MED ORDER — SODIUM CHLORIDE 0.9 % WEIGHT BASED INFUSION
3.0000 mL/kg/h | INTRAVENOUS | Status: AC
  Administered 2022-02-06: 3 mL/kg/h via INTRAVENOUS

## 2022-02-06 SURGICAL SUPPLY — 13 items
BAND ZEPHYR COMPRESS 30 LONG (HEMOSTASIS) ×1 IMPLANT
CATH INFINITI 5 FR IM (CATHETERS) ×1 IMPLANT
CATH INFINITI 5FR AL1 (CATHETERS) ×1 IMPLANT
CATH INFINITI 5FR MULTPACK ANG (CATHETERS) ×1 IMPLANT
GLIDESHEATH SLEND SS 6F .021 (SHEATH) ×1 IMPLANT
GUIDEWIRE INQWIRE 1.5J.035X260 (WIRE) IMPLANT
INQWIRE 1.5J .035X260CM (WIRE) ×2
KIT HEART LEFT (KITS) ×2 IMPLANT
PACK CARDIAC CATHETERIZATION (CUSTOM PROCEDURE TRAY) ×2 IMPLANT
SHEATH PROBE COVER 6X72 (BAG) ×1 IMPLANT
SYR MEDRAD MARK 7 150ML (SYRINGE) ×2 IMPLANT
TRANSDUCER W/STOPCOCK (MISCELLANEOUS) ×2 IMPLANT
TUBING CIL FLEX 10 FLL-RA (TUBING) ×2 IMPLANT

## 2022-02-06 NOTE — Discharge Instructions (Addendum)
Stop taking Plavix.  ? ?Resume metformin on Monday ? ?Drink plenty of fluids for 48 hours and keep wrist elevated at heart level for 24 hours ? ?Radial Site Care ? ? ?This sheet gives you information about how to care for yourself after your procedure. Your health care provider may also give you more specific instructions. If you have problems or questions, contact your health care provider. ?What can I expect after the procedure? ?After the procedure, it is common to have: ?Bruising and tenderness at the catheter insertion area. ?Follow these instructions at home: ?Medicines ?Take over-the-counter and prescription medicines only as told by your health care provider. ?Insertion site care ?Follow instructions from your health care provider about how to take care of your insertion site. Make sure you: ?Wash your hands with soap and water before you change your bandage (dressing). If soap and water are not available, use hand sanitizer. ?Remove your dressing as told by your health care provider. In 24 hours ?Check your insertion site every day for signs of infection. Check for: ?Redness, swelling, or pain. ?Fluid or blood. ?Pus or a bad smell. ?Warmth. ?Do not take baths, swim, or use a hot tub until your health care provider approves. ?You may shower 24-48 hours after the procedure, or as directed by your health care provider. ?Remove the dressing and gently wash the site with plain soap and water. ?Pat the area dry with a clean towel. ?Do not rub the site. That could cause bleeding. ?Do not apply powder or lotion to the site. ?Activity ? ? ?For 24 hours after the procedure, or as directed by your health care provider: ?Do not flex or bend the affected arm. ?Do not push or pull heavy objects with the affected arm. ?Do not drive yourself home from the hospital or clinic. You may drive 24 hours after the procedure unless your health care provider tells you not to. ?Do not operate machinery or power tools. ?Do not lift  anything that is heavier than 10 lb (4.5 kg), or the limit that you are told, until your health care provider says that it is safe.  For 4 days ?Ask your health care provider when it is okay to: ?Return to work or school. ?Resume usual physical activities or sports. ?Resume sexual activity. ?General instructions ?If the catheter site starts to bleed, raise your arm and put firm pressure on the site. If the bleeding does not stop, get help right away. This is a medical emergency. ?If you went home on the same day as your procedure, a responsible adult should be with you for the first 24 hours after you arrive home. ?Keep all follow-up visits as told by your health care provider. This is important. ?Contact a health care provider if: ?You have a fever. ?You have redness, swelling, or yellow drainage around your insertion site. ?Get help right away if: ?You have unusual pain at the radial site. ?The catheter insertion area swells very fast. ?The insertion area is bleeding, and the bleeding does not stop when you hold steady pressure on the area. ?Your arm or hand becomes pale, cool, tingly, or numb. ?These symptoms may represent a serious problem that is an emergency. Do not wait to see if the symptoms will go away. Get medical help right away. Call your local emergency services (911 in the [place] Do not drive yourself to the hospital. ?Summary ?After the procedure, it is common to have bruising and tenderness at the site. ?Follow instructions from your  health care provider about how to take care of your radial site wound. Check the wound every day for signs of infection. ?Do not lift anything that is heavier than 10 lb (4.5 kg), or the limit that you are told, until your health care provider says that it is safe. ?This information is not intended to replace advice given to you by your health care provider. Make sure you discuss any questions you have with your health care provider. ?Document Revised: 12/01/2017  Document Reviewed: 12/01/2017 ?Elsevier Patient Education ? [place].  ? ?

## 2022-02-06 NOTE — Interval H&P Note (Signed)
History and Physical Interval Note: ? ?02/06/2022 ?10:24 AM ? ?Miguel Vasquez  has presented today for surgery, with the diagnosis of shortness of breath.  The various methods of treatment have been discussed with the patient and family. After consideration of risks, benefits and other options for treatment, the patient has consented to  Procedure(s): ?LEFT HEART CATH AND CORS/GRAFTS ANGIOGRAPHY (N/A) as a surgical intervention.  The patient's history has been reviewed, patient examined, no change in status, stable for surgery.  I have reviewed the patient's chart and labs.  Questions were answered to the patient's satisfaction.   ?Cath Lab Visit (complete for each Cath Lab visit) ? ?Clinical Evaluation Leading to the Procedure:  ? ?ACS: No. ? ?Non-ACS:   ? ?Anginal Classification: CCS II ? ?Anti-ischemic medical therapy: Maximal Therapy (2 or more classes of medications) ? ?Non-Invasive Test Results: No non-invasive testing performed ? ?Prior CABG: Previous CABG ? ? ? ? ? ? ? ?[facility] ?02/06/2022' ?10:25 AM ? ? ? ?

## 2022-02-09 ENCOUNTER — Encounter (HOSPITAL_COMMUNITY): Payer: Self-pay | Admitting: Cardiology

## 2022-02-18 ENCOUNTER — Ambulatory Visit: Payer: Medicare PPO | Admitting: Cardiology

## 2022-02-19 ENCOUNTER — Institutional Professional Consult (permissible substitution): Payer: Medicare PPO | Admitting: Thoracic Surgery (Cardiothoracic Vascular Surgery)

## 2022-02-19 ENCOUNTER — Encounter: Payer: Self-pay | Admitting: Thoracic Surgery (Cardiothoracic Vascular Surgery)

## 2022-02-19 VITALS — BP 170/74 | HR 79 | Resp 20 | Ht 73.0 in | Wt 212.0 lb

## 2022-02-19 DIAGNOSIS — I2510 Atherosclerotic heart disease of native coronary artery without angina pectoris: Secondary | ICD-10-CM | POA: Diagnosis not present

## 2022-02-19 DIAGNOSIS — Z951 Presence of aortocoronary bypass graft: Secondary | ICD-10-CM | POA: Diagnosis not present

## 2022-02-19 NOTE — Progress Notes (Signed)
PCP is Liffrig, [name], MD ?Referring Provider is Martinique, Peter M, MD ? ?Chief Complaint  ?Patient presents with  ? Coronary Artery Disease  ?  Surgical consult, Cardiac cath and ECHO 02/06/22/ HX of CABG 09/2014  ? ? ?HPI: Mr. Radi is sent for consideration for redo bypass grafting for three-vessel coronary disease. ? ?Miguel Vasquez is a 76 year old man with a past medical history significant for coronary artery disease, previous stents and rotational atherectomy, coronary bypass grafting, hypertension, hyperlipidemia, type 2 diabetes, mild aortic insufficiency, panic disorder, hypothyroidism, gout, and thrombocytopenia.  He had coronary bypass grafting x3 in 2015.  He was noted to have good targets and good conduits.  His vein graft to the OM failed relatively early likely due to competitive flow.  Later had the vein graft to the posterior lateral became occluded. ? ?Recently, he noted some shortness of breath and vague tightness in his chest with lifting some heavy bags in his yard.  He saw Dr. Martinique.  He underwent cardiac catheterization on 02/06/2022.  It revealed all 3 grafts were occluded.  He had severe native three-vessel coronary disease.  Not favorable for percutaneous intervention.  Preserved left ventricular function. ? ? ?Past Medical History:  ?Diagnosis Date  ? Aortic insufficiency   ? a. Mild AI by echo 04/2016.  ? Basal cell carcinoma, face   ? Coronary disease   ? a. stent to Cx 1996. b. stent to RCA 2002. c. s/p CABGx3 in 2015. d.  rotational atherectomy and DESx2 to ostial to mid RCA.  ? Elevated liver enzymes   ? GERD (gastroesophageal reflux disease)   ? H. pylori infection tx'd in the 1970s  ? Hemochromatosis   ? History of gout 1990s X 1  ? Hypercholesterolemia   ? Hypertension   ? Controlled  ? Hypothyroid   ? Myocardial infarction [facility]) 2002  ? "mild"  ? Obesity   ? Panic disorder   ? Hx of panic disorder  ? S/P primary angioplasty with coronary stent and rotational atherectomy of ostial  mRCA with DES X 2-- 7/11/7 05/20/2016  ? Sinus bradycardia   ? a. h/o marked sinus bradycardia, BB stopped.  ? Tachycardia   ? a. Noted on stress test strips in 2017. ? Atrial flutter. Further w/u underway.  ? Thrombocytopenia ([place])   ? a. per review of labs.  ? Type II diabetes mellitus ([place])   ? ? ?Past Surgical History:  ?Procedure Laterality Date  ? BASAL CELL CARCINOMA EXCISION    ? "face & lips"  ? CARDIAC CATHETERIZATION  11//2015  ? CARDIAC CATHETERIZATION N/A 05/11/2016  ? Procedure: Left Heart Cath and Cors/Grafts Angiography;  Surgeon: Jettie Booze, MD;  Location: [place] CV [facility];  Service: Cardiovascular;  Laterality: N/A;  ? CARDIAC CATHETERIZATION N/A 05/19/2016  ? Procedure: Coronary/Graft Atherectomy;  Surgeon: Peter M Martinique, MD;  Location: [place] [facility];  Service: Cardiovascular;  Laterality: N/A;  ? CARDIAC CATHETERIZATION N/A 05/19/2016  ? Procedure: Coronary Stent Intervention;  Surgeon: Peter M Martinique, MD;  Location: [place] [facility];  Service: Cardiovascular;  Laterality: N/A;  ? CATARACT EXTRACTION W/ INTRAOCULAR LENS  IMPLANT, BILATERAL Bilateral 2016  ? CORONARY ANGIOPLASTY WITH STENT PLACEMENT  1996; 2002; 05/19/2016  ? "1 + 2 +2"  ? CORONARY ARTERY BYPASS GRAFT N/A 10/03/2014  ? Procedure: CORONARY ARTERY BYPASS GRAFTING (CABG) x  three, using left internal mammary artery and right leg greater saphenous vein harvested endoscopically;  Surgeon: Melrose Nakayama,  MD;  Location: [facility];  Service: Open Heart Surgery;  Laterality: N/A;  ? LEFT HEART CATH AND CORS/GRAFTS ANGIOGRAPHY N/A 02/06/2022  ? Procedure: LEFT HEART CATH AND CORS/GRAFTS ANGIOGRAPHY;  Surgeon: Martinique, Peter M, MD;  Location: [place] [facility];  Service: Cardiovascular;  Laterality: N/A;  ? LEFT HEART CATHETERIZATION WITH CORONARY ANGIOGRAM N/A 09/28/2014  ? Procedure: LEFT HEART CATHETERIZATION WITH CORONARY ANGIOGRAM;  Surgeon: Troy Sine, MD;  Location: [facility] CATH LAB;  Service: Cardiovascular;   Laterality: N/A;  ? TEE WITHOUT CARDIOVERSION N/A 10/03/2014  ? Procedure: TRANSESOPHAGEAL ECHOCARDIOGRAM (TEE);  Surgeon: Melrose Nakayama, MD;  Location: [place];  Service: Open Heart Surgery;  Laterality: N/A;  ? TONSILLECTOMY  1950s  ? TRIGGER FINGER RELEASE Bilateral 1990s-2000s  ? ? ?Family History  ?Problem Relation Age of Onset  ? Heart attack Sister   ? Heart failure Mother   ? Colon cancer Mother   ? Colon cancer Brother   ? GI problems Father   ? ? ?Social History ?Social History  ? ?Tobacco Use  ? Smoking status: Former  ?  Packs/day: 1.00  ?  Years: 20.00  ?  Pack years: 20.00  ?  Types: Cigarettes  ?  Start date: 09/20/1995  ?  Quit date: 09/24/1995  ?  Years since quitting: 26.4  ? Smokeless tobacco: Never  ?Substance Use Topics  ? Alcohol use: Yes  ?  Alcohol/week: 6.0 standard drinks  ?  Types: 6 Cans of beer per week  ? Drug use: No  ? ? ?Current Outpatient Medications  ?Medication Sig Dispense Refill  ? allopurinol (ZYLOPRIM) 300 MG tablet Take 150 mg by mouth in the morning.    ? aspirin 81 MG EC tablet Take 81 mg by mouth every evening.    ? Cholecalciferol (VITAMIN D3) 25 MCG (1000 UT) CAPS Take 1,000 Units by mouth every evening.    ? ezetimibe (ZETIA) 10 MG tablet Take 10 mg by mouth daily.      ? folic acid (FOLVITE) 419 MCG tablet Take 400 mcg by mouth in the morning.    ? glipiZIDE (GLUCOTROL XL) 5 MG 24 hr tablet Take 5 mg by mouth daily with breakfast.    ? levothyroxine (SYNTHROID, LEVOTHROID) 100 MCG tablet Take 100 mcg by mouth daily.      ? metFORMIN (GLUCOPHAGE) 500 MG tablet Take 2 tablets (1,000 mg total) by mouth in the morning and at bedtime. Take 2 tablets by mouth 2 times daily with meal.    ? nitroGLYCERIN (NITROSTAT) 0.4 MG SL tablet Place 0.4 mg under the tongue every 5 (five) minutes as needed for chest pain. DISSOLVE ONE TABLET UNDER THE TONGUE EVERY 5 MINUTES AS NEEDED FOR CHEST PAIN.  DO NOT EXCEED A TOTAL OF 3 DOSES IN 15 MINUTES    ? omega-3 acid ethyl esters  (LOVAZA) 1 g capsule Take 2 g by mouth 2 (two) times daily.    ? pantoprazole (PROTONIX) 40 MG tablet Take 1 tablet (40 mg total) by mouth daily. (Patient taking differently: Take 40 mg by mouth every evening.) 30 tablet 11  ? ramipril (ALTACE) 10 MG capsule Take 20 mg by mouth in the morning.    ? rosuvastatin (CRESTOR) 10 MG tablet Take 10 mg by mouth in the morning.    ? ?No current facility-administered medications for this visit.  ? ? ?Allergies  ?Allergen Reactions  ? Statins Other (See Comments)  ?  "Affects liver enzymes"  ? ? ?Review  of Systems  ?Constitutional:  Negative for activity change and fatigue.  ?HENT:  Negative for trouble swallowing and voice change.   ?Respiratory:  Positive for shortness of breath.   ?Cardiovascular:  Negative for chest pain and leg swelling.  ?Genitourinary:  Positive for frequency.  ?Musculoskeletal:  Positive for gait problem.  ?Neurological:   ?     Memory problems  ? ?BP (!) 170/74   Pulse 79   Resp 20   Ht '6\' 1"'$  (1.854 m)   Wt 212 lb (96.2 kg)   SpO2 96% Comment: RA  BMI 27.97 kg/m?  ?Physical Exam ?Vitals reviewed.  ?Constitutional:   ?   Appearance: Normal appearance.  ?Eyes:  ?   General: No scleral icterus. ?   Extraocular Movements: Extraocular movements intact.  ?Neck:  ?   Vascular: No carotid bruit.  ?Cardiovascular:  ?   Rate and Rhythm: Normal rate.  ?   Pulses: Normal pulses.  ?   Heart sounds: Murmur (2/6 systolic) heard.  ?   Comments: Normal Allen's test on right ?Pulmonary:  ?   Effort: Pulmonary effort is normal. No respiratory distress.  ?   Breath sounds: Normal breath sounds. No wheezing or rales.  ?Abdominal:  ?   General: There is no distension.  ?   Palpations: Abdomen is soft.  ?Musculoskeletal:     ?   General: Deformity (Dupuytren's contracture both hands) present.  ?Skin: ?   General: Skin is warm and dry.  ?   Comments: Sternal incision well-healed ?Well-healed right leg incision  ?Neurological:  ?   General: No focal deficit present.  ?    Mental Status: He is alert and oriented to person, place, and time.  ? ? ?Diagnostic Tests: ?Echocardiogram 02/06/2022 ?IMPRESSIONS  ? ? ? 1. Left ventricular ejection fraction, by estimation, is 55 to 60%. The  ?left ventri

## 2022-02-19 NOTE — H&P (View-Only) (Signed)
PCP is Liffrig, [name], MD ?Referring Provider is Martinique, Peter M, MD ? ?Chief Complaint  ?Patient presents with  ? Coronary Artery Disease  ?  Surgical consult, Cardiac cath and ECHO 02/06/22/ HX of CABG 09/2014  ? ? ?HPI: [name] is sent for consideration for redo bypass grafting for three-vessel coronary disease. ? ?Miguel Vasquez is a 76 year old man with a past medical history significant for coronary artery disease, previous stents and rotational atherectomy, coronary bypass grafting, hypertension, hyperlipidemia, type 2 diabetes, mild aortic insufficiency, panic disorder, hypothyroidism, gout, and thrombocytopenia.  He had coronary bypass grafting x3 in 2015.  He was noted to have good targets and good conduits.  His vein graft to the OM failed relatively early likely due to competitive flow.  Later had the vein graft to the posterior lateral became occluded. ? ?Recently, he noted some shortness of breath and vague tightness in his chest with lifting some heavy bags in his yard.  He saw Dr. Martinique.  He underwent cardiac catheterization on 02/06/2022.  It revealed all 3 grafts were occluded.  He had severe native three-vessel coronary disease.  Not favorable for percutaneous intervention.  Preserved left ventricular function. ? ? ?Past Medical History:  ?Diagnosis Date  ? Aortic insufficiency   ? a. Mild AI by echo 04/2016.  ? Basal cell carcinoma, face   ? Coronary disease   ? a. stent to Cx 1996. b. stent to RCA 2002. c. s/p CABGx3 in 2015. d.  rotational atherectomy and DESx2 to ostial to mid RCA.  ? Elevated liver enzymes   ? GERD (gastroesophageal reflux disease)   ? H. pylori infection tx'd in the 1970s  ? Hemochromatosis   ? History of gout 1990s X 1  ? Hypercholesterolemia   ? Hypertension   ? Controlled  ? Hypothyroid   ? Myocardial infarction [facility]) 2002  ? "mild"  ? Obesity   ? Panic disorder   ? Hx of panic disorder  ? S/P primary angioplasty with coronary stent and rotational atherectomy of ostial  mRCA with DES X 2-- 7/11/7 05/20/2016  ? Sinus bradycardia   ? a. h/o marked sinus bradycardia, BB stopped.  ? Tachycardia   ? a. Noted on stress test strips in 2017. ? Atrial flutter. Further w/u underway.  ? Thrombocytopenia ([place])   ? a. per review of labs.  ? Type II diabetes mellitus ([place])   ? ? ?Past Surgical History:  ?Procedure Laterality Date  ? BASAL CELL CARCINOMA EXCISION    ? "face & lips"  ? CARDIAC CATHETERIZATION  11//2015  ? CARDIAC CATHETERIZATION N/A 05/11/2016  ? Procedure: Left Heart Cath and Cors/Grafts Angiography;  Surgeon: Jettie Booze, MD;  Location: [place] CV [facility];  Service: Cardiovascular;  Laterality: N/A;  ? CARDIAC CATHETERIZATION N/A 05/19/2016  ? Procedure: Coronary/Graft Atherectomy;  Surgeon: Peter M Martinique, MD;  Location: [place] CV [facility];  Service: Cardiovascular;  Laterality: N/A;  ? CARDIAC CATHETERIZATION N/A 05/19/2016  ? Procedure: Coronary Stent Intervention;  Surgeon: Peter M Martinique, MD;  Location: [place] CV LAB;  Service: Cardiovascular;  Laterality: N/A;  ? CATARACT EXTRACTION W/ INTRAOCULAR LENS  IMPLANT, BILATERAL Bilateral 2016  ? CORONARY ANGIOPLASTY WITH STENT PLACEMENT  1996; 2002; 05/19/2016  ? "1 + 2 +2"  ? CORONARY ARTERY BYPASS GRAFT N/A 10/03/2014  ? Procedure: CORONARY ARTERY BYPASS GRAFTING (CABG) x  three, using left internal mammary artery and right leg greater saphenous vein harvested endoscopically;  Surgeon: Melrose Nakayama,  MD;  Location: [facility] OR;  Service: Open Heart Surgery;  Laterality: N/A;  ? LEFT HEART CATH AND CORS/GRAFTS ANGIOGRAPHY N/A 02/06/2022  ? Procedure: LEFT HEART CATH AND CORS/GRAFTS ANGIOGRAPHY;  Surgeon: Martinique, Peter M, MD;  Location: [place] CV LAB;  Service: Cardiovascular;  Laterality: N/A;  ? LEFT HEART CATHETERIZATION WITH CORONARY ANGIOGRAM N/A 09/28/2014  ? Procedure: LEFT HEART CATHETERIZATION WITH CORONARY ANGIOGRAM;  Surgeon: Troy Sine, MD;  Location: [facility] CATH LAB;  Service: Cardiovascular;   Laterality: N/A;  ? TEE WITHOUT CARDIOVERSION N/A 10/03/2014  ? Procedure: TRANSESOPHAGEAL ECHOCARDIOGRAM (TEE);  Surgeon: Melrose Nakayama, MD;  Location: [place];  Service: Open Heart Surgery;  Laterality: N/A;  ? TONSILLECTOMY  1950s  ? TRIGGER FINGER RELEASE Bilateral 1990s-2000s  ? ? ?Family History  ?Problem Relation Age of Onset  ? Heart attack Sister   ? Heart failure Mother   ? Colon cancer Mother   ? Colon cancer Brother   ? GI problems Father   ? ? ?Social History ?Social History  ? ?Tobacco Use  ? Smoking status: Former  ?  Packs/day: 1.00  ?  Years: 20.00  ?  Pack years: 20.00  ?  Types: Cigarettes  ?  Start date: 09/20/1995  ?  Quit date: 09/24/1995  ?  Years since quitting: 26.4  ? Smokeless tobacco: Never  ?Substance Use Topics  ? Alcohol use: Yes  ?  Alcohol/week: 6.0 standard drinks  ?  Types: 6 Cans of beer per week  ? Drug use: No  ? ? ?Current Outpatient Medications  ?Medication Sig Dispense Refill  ? allopurinol (ZYLOPRIM) 300 MG tablet Take 150 mg by mouth in the morning.    ? aspirin 81 MG EC tablet Take 81 mg by mouth every evening.    ? Cholecalciferol (VITAMIN D3) 25 MCG (1000 UT) CAPS Take 1,000 Units by mouth every evening.    ? ezetimibe (ZETIA) 10 MG tablet Take 10 mg by mouth daily.      ? folic acid (FOLVITE) 248 MCG tablet Take 400 mcg by mouth in the morning.    ? glipiZIDE (GLUCOTROL XL) 5 MG 24 hr tablet Take 5 mg by mouth daily with breakfast.    ? levothyroxine (SYNTHROID, LEVOTHROID) 100 MCG tablet Take 100 mcg by mouth daily.      ? metFORMIN (GLUCOPHAGE) 500 MG tablet Take 2 tablets (1,000 mg total) by mouth in the morning and at bedtime. Take 2 tablets by mouth 2 times daily with meal.    ? nitroGLYCERIN (NITROSTAT) 0.4 MG SL tablet Place 0.4 mg under the tongue every 5 (five) minutes as needed for chest pain. DISSOLVE ONE TABLET UNDER THE TONGUE EVERY 5 MINUTES AS NEEDED FOR CHEST PAIN.  DO NOT EXCEED A TOTAL OF 3 DOSES IN 15 MINUTES    ? omega-3 acid ethyl esters  (LOVAZA) 1 g capsule Take 2 g by mouth 2 (two) times daily.    ? pantoprazole (PROTONIX) 40 MG tablet Take 1 tablet (40 mg total) by mouth daily. (Patient taking differently: Take 40 mg by mouth every evening.) 30 tablet 11  ? ramipril (ALTACE) 10 MG capsule Take 20 mg by mouth in the morning.    ? rosuvastatin (CRESTOR) 10 MG tablet Take 10 mg by mouth in the morning.    ? ?No current facility-administered medications for this visit.  ? ? ?Allergies  ?Allergen Reactions  ? Statins Other (See Comments)  ?  "Affects liver enzymes"  ? ? ?Review  of Systems  ?Constitutional:  Negative for activity change and fatigue.  ?HENT:  Negative for trouble swallowing and voice change.   ?Respiratory:  Positive for shortness of breath.   ?Cardiovascular:  Negative for chest pain and leg swelling.  ?Genitourinary:  Positive for frequency.  ?Musculoskeletal:  Positive for gait problem.  ?Neurological:   ?     Memory problems  ? ?BP (!) 170/74   Pulse 79   Resp 20   Ht '6\' 1"'$  (1.854 m)   Wt 212 lb (96.2 kg)   SpO2 96% Comment: RA  BMI 27.97 kg/m?  ?Physical Exam ?Vitals reviewed.  ?Constitutional:   ?   Appearance: Normal appearance.  ?Eyes:  ?   General: No scleral icterus. ?   Extraocular Movements: Extraocular movements intact.  ?Neck:  ?   Vascular: No carotid bruit.  ?Cardiovascular:  ?   Rate and Rhythm: Normal rate.  ?   Pulses: Normal pulses.  ?   Heart sounds: Murmur (2/6 systolic) heard.  ?   Comments: Normal Allen's test on right ?Pulmonary:  ?   Effort: Pulmonary effort is normal. No respiratory distress.  ?   Breath sounds: Normal breath sounds. No wheezing or rales.  ?Abdominal:  ?   General: There is no distension.  ?   Palpations: Abdomen is soft.  ?Musculoskeletal:     ?   General: Deformity (Dupuytren's contracture both hands) present.  ?Skin: ?   General: Skin is warm and dry.  ?   Comments: Sternal incision well-healed ?Well-healed right leg incision  ?Neurological:  ?   General: No focal deficit present.  ?    Mental Status: He is alert and oriented to person, place, and time.  ? ? ?Diagnostic Tests: ?Echocardiogram 02/06/2022 ?IMPRESSIONS  ? ? ? 1. Left ventricular ejection fraction, by estimation, is 55 to 60%. The  ?left ventri

## 2022-02-20 ENCOUNTER — Encounter: Payer: Self-pay | Admitting: *Deleted

## 2022-02-20 ENCOUNTER — Other Ambulatory Visit: Payer: Self-pay | Admitting: *Deleted

## 2022-02-20 DIAGNOSIS — I2510 Atherosclerotic heart disease of native coronary artery without angina pectoris: Secondary | ICD-10-CM

## 2022-02-20 NOTE — Progress Notes (Signed)
? ?Cardiology Office Note   ? ?Date:  02/24/2022  ?ID:  Miguel Vasquez, DOB 28-Jul-1946, MRN [id number] ?PCP:  Audie Box, MD  ?Cardiologist:  Zaylyn Bergdoll Martinique MD ? ?Chief Complaint: f/u CAD ? ?History of Present Illness:  ?Miguel Vasquez is a 76 y.o. male seen for evaluation of DOE and some chest pain. He has a history of CAD (stent to Cx 1996, stent to RCA 2002, s/p CABGx3 in 2015, rotational atherectomy/DESx2 to RCA 05/2017), HLD (intolerant of statins due to elevated liver/muscle enzymes), HTN, DM, hemochromatosis, cervical disc disease, panic disorder, hypothyroidism, h/o marked sinus bradycardia (BB stopped)  ? ?Last 2D Echo 09/2014: mild-mod LVH, EF 55-60%, cannot exclude RWMA, mild AI. This past summer he was complaining of atypical chest pain but this was similar to what prompted CABG. Nuc was abnormal so he underwent diagnostic cath 05/11/16 showing severe, diffuse calcific disease and a very large dominant RCA - essentially, chronic occlusion at the ostium. There were brisk left to right collaterals. He had occluded SVG-OM. 50% mLAD, patent LIMA-LAD. LVEF and LVEDP were normal. He was brought back for staged PCI with successful rotational atherectomy and stenting of the ostial to mid RCA with DES x 2. DAPT was recommended indefinitely.  We had also checked an echo 02/04/50 for systolic murmur showing mod LVH, EF 60-65%, grade 1 DD, mild AI. ? ?He was seen in February and doing OK at that time but fairly inactive. 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 Some tightness. No pain. Did not have typical angina before. He was concerned. No pain or SOB now. He is generally pretty sedentary. He has been taking his Crestor daily for the last 1-2 months. ? ?He did undergo cardiac cath  on 02/06/22. This demonstrated occlusion of prior bypass grafts including LIMA. Severe 3 vessel disease. EF is preserved. He was seen by Dr Roxan Hockey on 02/19/22 for consideration  of redo CABG which is tentatively planned on 03/05/22. We discussed his upcoming surgery extensively and answered all his questions. He has not had significant angina in the past week. ? ? ?Past Medical History:  ?Diagnosis Date  ? Aortic insufficiency   ? a. Mild AI by echo 04/2016.  ? Basal cell carcinoma, face   ? Coronary disease   ? a. stent to Cx 1996. b. stent to RCA 2002. c. s/p CABGx3 in 2015. d.  rotational atherectomy and DESx2 to ostial to mid RCA.  ? Elevated liver enzymes   ? GERD (gastroesophageal reflux disease)   ? H. pylori infection tx'd in the 1970s  ? Hemochromatosis   ? History of gout 1990s X 1  ? Hypercholesterolemia   ? Hypertension   ? Controlled  ? Hypothyroid   ? Myocardial infarction [facility]) 2002  ? "mild"  ? Obesity   ? Panic disorder   ? Hx of panic disorder  ? S/P primary angioplasty with coronary stent and rotational atherectomy of ostial mRCA with DES X 2-- 7/11/7 05/20/2016  ? Sinus bradycardia   ? a. h/o marked sinus bradycardia, BB stopped.  ? Tachycardia   ? a. Noted on stress test strips in 2017. ? Atrial flutter. Further w/u underway.  ? Thrombocytopenia ([place])   ? a. per review of labs.  ? Type II diabetes mellitus ([place])   ? ? ?Past Surgical History:  ?Procedure Laterality Date  ? BASAL CELL CARCINOMA EXCISION    ? "face & lips"  ?  CARDIAC CATHETERIZATION  11//2015  ? CARDIAC CATHETERIZATION N/A 05/11/2016  ? Procedure: Left Heart Cath and Cors/Grafts Angiography;  Surgeon: Jettie Booze, MD;  Location: [facility];  Service: Cardiovascular;  Laterality: N/A;  ? CARDIAC CATHETERIZATION N/A 05/19/2016  ? Procedure: Coronary/Graft Atherectomy;  Surgeon: Izaan Kingbird M Martinique, MD;  Location: [place] [facility];  Service: Cardiovascular;  Laterality: N/A;  ? CARDIAC CATHETERIZATION N/A 05/19/2016  ? Procedure: Coronary Stent Intervention;  Surgeon: Mckaela Howley M Martinique, MD;  Location: [facility];  Service: Cardiovascular;  Laterality: N/A;  ? CATARACT EXTRACTION W/ INTRAOCULAR LENS   IMPLANT, BILATERAL Bilateral 2016  ? CORONARY ANGIOPLASTY WITH STENT PLACEMENT  1996; 2002; 05/19/2016  ? "1 + 2 +2"  ? CORONARY ARTERY BYPASS GRAFT N/A 10/03/2014  ? Procedure: CORONARY ARTERY BYPASS GRAFTING (CABG) x  three, using left internal mammary artery and right leg greater saphenous vein harvested endoscopically;  Surgeon: Melrose Nakayama, MD;  Location: [facility];  Service: Open Heart Surgery;  Laterality: N/A;  ? LEFT HEART CATH AND CORS/GRAFTS ANGIOGRAPHY N/A 02/06/2022  ? Procedure: LEFT HEART CATH AND CORS/GRAFTS ANGIOGRAPHY;  Surgeon: Martinique, Jasiah Buntin M, MD;  Location: [facility];  Service: Cardiovascular;  Laterality: N/A;  ? LEFT HEART CATHETERIZATION WITH CORONARY ANGIOGRAM N/A 09/28/2014  ? Procedure: LEFT HEART CATHETERIZATION WITH CORONARY ANGIOGRAM;  Surgeon: Troy Sine, MD;  Location: [facility];  Service: Cardiovascular;  Laterality: N/A;  ? TEE WITHOUT CARDIOVERSION N/A 10/03/2014  ? Procedure: TRANSESOPHAGEAL ECHOCARDIOGRAM (TEE);  Surgeon: Melrose Nakayama, MD;  Location: [facility];  Service: Open Heart Surgery;  Laterality: N/A;  ? TONSILLECTOMY  1950s  ? TRIGGER FINGER RELEASE Bilateral 1990s-2000s  ? ? ?Current Medications: ?Current Outpatient Medications  ?Medication Sig Dispense Refill  ? allopurinol (ZYLOPRIM) 300 MG tablet Take 150 mg by mouth in the morning.    ? aspirin 81 MG EC tablet Take 81 mg by mouth every evening.    ? Cholecalciferol (VITAMIN D3) 25 MCG (1000 UT) CAPS Take 1,000 Units by mouth every evening.    ? ezetimibe (ZETIA) 10 MG tablet Take 10 mg by mouth daily.      ? folic acid (FOLVITE) 510 MCG tablet Take 400 mcg by mouth in the morning.    ? glipiZIDE (GLUCOTROL XL) 5 MG 24 hr tablet Take 5 mg by mouth daily with breakfast.    ? levothyroxine (SYNTHROID, LEVOTHROID) 100 MCG tablet Take 100 mcg by mouth daily.      ? metFORMIN (GLUCOPHAGE) 500 MG tablet Take 2 tablets (1,000 mg total) by mouth in the morning and at bedtime. Take 2 tablets by mouth 2  times daily with meal.    ? nitroGLYCERIN (NITROSTAT) 0.4 MG SL tablet Place 0.4 mg under the tongue every 5 (five) minutes as needed for chest pain. DISSOLVE ONE TABLET UNDER THE TONGUE EVERY 5 MINUTES AS NEEDED FOR CHEST PAIN.  DO NOT EXCEED A TOTAL OF 3 DOSES IN 15 MINUTES    ? omega-3 acid ethyl esters (LOVAZA) 1 g capsule Take 2 g by mouth 2 (two) times daily.    ? pantoprazole (PROTONIX) 40 MG tablet Take 1 tablet (40 mg total) by mouth daily. (Patient taking differently: Take 40 mg by mouth every evening.) 30 tablet 11  ? ramipril (ALTACE) 10 MG capsule Take 20 mg by mouth in the morning.    ? rosuvastatin (CRESTOR) 10 MG tablet Take 10 mg by mouth in the morning.    ? ?  No current facility-administered medications for this visit.  ?  ? ?Allergies:   Statins  ? ?Social History  ? ?Socioeconomic History  ? Marital status: Married  ?  Spouse name: Not on file  ? Number of children: 1  ? Years of education: Not on file  ? Highest education level: Not on file  ?Occupational History  ? Occupation: Agricultural consultant [employer]  ?Tobacco Use  ? Smoking status: Former  ?  Packs/day: 1.00  ?  Years: 20.00  ?  Pack years: 20.00  ?  Types: Cigarettes  ?  Start date: 09/20/1995  ?  Quit date: 09/24/1995  ?  Years since quitting: 26.4  ? Smokeless tobacco: Never  ?Substance and Sexual Activity  ? Alcohol use: Yes  ?  Alcohol/week: 6.0 standard drinks  ?  Types: 6 Cans of beer per week  ? Drug use: No  ? Sexual activity: Yes  ?Other Topics Concern  ? Not on file  ?Social History Narrative  ? Not on file  ? ?Social Determinants of Health  ? ?Financial Resource Strain: Not on file  ?Food Insecurity: Not on file  ?Transportation Needs: Not on file  ?Physical Activity: Not on file  ?Stress: Not on file  ?Social Connections: Not on file  ?  ? ?Family History:  The patient's family history includes Colon cancer in his brother and mother; GI problems in his father; Heart attack in his sister; Heart failure in his mother.  ? ?ROS:   ?Please see  the history of present illness.   ?All other systems are reviewed and otherwise negative.  ? ? ?PHYSICAL EXAM:   ?VS:  BP (!) 146/80   Pulse 72   Ht '6\' 1"'$  (1.854 m)   Wt 212 lb (96.2 kg)   SpO2 96%   B

## 2022-02-24 ENCOUNTER — Ambulatory Visit: Payer: Medicare PPO | Admitting: Cardiology

## 2022-02-24 ENCOUNTER — Encounter: Payer: Self-pay | Admitting: Cardiology

## 2022-02-24 VITALS — BP 146/80 | HR 72 | Ht 73.0 in | Wt 212.0 lb

## 2022-02-24 DIAGNOSIS — I10 Essential (primary) hypertension: Secondary | ICD-10-CM | POA: Diagnosis not present

## 2022-02-24 DIAGNOSIS — E7800 Pure hypercholesterolemia, unspecified: Secondary | ICD-10-CM | POA: Diagnosis not present

## 2022-02-24 DIAGNOSIS — Z951 Presence of aortocoronary bypass graft: Secondary | ICD-10-CM

## 2022-02-24 DIAGNOSIS — I25708 Atherosclerosis of coronary artery bypass graft(s), unspecified, with other forms of angina pectoris: Secondary | ICD-10-CM | POA: Diagnosis not present

## 2022-03-02 NOTE — Progress Notes (Signed)
Surgical Instructions ? ? ? Your procedure is scheduled on Thursday 03/05/22. ? ? Report to [facility] Main Entrance "A" at 06:00 A.M., then check in with the Admitting office. ? Call this number if you have problems the morning of surgery: ? [phone] ? ? If you have any questions prior to your surgery date call [phone]: Open Monday-Friday 8am-4pm ? ? ? Remember: ? Do not eat or drink after midnight the night before your surgery ?  ? Take these medicines the morning of surgery with A SIP OF WATER:  ? allopurinol (ZYLOPRIM) ? ezetimibe (ZETIA) ? levothyroxine (SYNTHROID, LEVOTHROID) ? rosuvastatin (CRESTOR) ? ? Take these medicines if needed: ? nitroGLYCERIN (NITROSTAT) ? ? ?As of today, STOP taking any Aspirin (unless otherwise instructed by your surgeon) Aleve, Naproxen, Ibuprofen, Motrin, Advil, Goody's, BC's, all herbal medications, fish oil, and all vitamins. ? ?WHAT DO I DO ABOUT MY DIABETES MEDICATION? ? ? ?Do not take oral diabetes medicines (pills) the morning of surgery. ? ?DO NOT TAKE glipiZIDE (GLUCOTROL XL) the day before surgery or the morning of surgery.  ? ?DO NOT TAKE metFORMIN (GLUCOPHAGE) the morning of surgery.  ? ?The day of surgery, do not take other diabetes injectables, including Byetta (exenatide), Bydureon (exenatide ER), Victoza (liraglutide), or Trulicity (dulaglutide). ? ? ?HOW TO MANAGE YOUR DIABETES ?BEFORE AND AFTER SURGERY ? ?Why is it important to control my blood sugar before and after surgery? ?Improving blood sugar levels before and after surgery helps healing and can limit problems. ?A way of improving blood sugar control is eating a healthy diet by: ? Eating less sugar and carbohydrates ? Increasing activity/exercise ? Talking with your doctor about reaching your blood sugar goals ?High blood sugars (greater than 180 mg/dL) can raise your risk of infections and slow your recovery, so you will need to focus on controlling your diabetes during the weeks before  surgery. ?Make sure that the doctor who takes care of your diabetes knows about your planned surgery including the date and location. ? ?How do I manage my blood sugar before surgery? ?Check your blood sugar at least 4 times a day, starting 2 days before surgery, to make sure that the level is not too high or low. ? ?Check your blood sugar the morning of your surgery when you wake up and every 2 hours until you get to the Short Stay unit. ? ?If your blood sugar is less than 70 mg/dL, you will need to treat for low blood sugar: ?Do not take insulin. ?Treat a low blood sugar (less than 70 mg/dL) with ? cup of clear juice (cranberry or apple), 4 glucose tablets, OR glucose gel. ?Recheck blood sugar in 15 minutes after treatment (to make sure it is greater than 70 mg/dL). If your blood sugar is not greater than 70 mg/dL on recheck, call [phone] for further instructions. ?Report your blood sugar to the short stay nurse when you get to Short Stay. ? ?If you are admitted to the hospital after surgery: ?Your blood sugar will be checked by the staff and you will probably be given insulin after surgery (instead of oral diabetes medicines) to make sure you have good blood sugar levels. ?The goal for blood sugar control after surgery is 80-180 mg/dL. ? ?         ?Do not wear jewelry or makeup ?Do not wear lotions, powders, perfumes/colognes, or deodorant. ?Do not shave 48 hours prior to surgery.  Men may shave face and neck. ?Do not bring  valuables to the hospital. ?Do not wear nail polish, gel polish, artificial nails, or any other type of covering on natural nails (fingers and toes) ?If you have artificial nails or gel coating that need to be removed by a nail salon, please have this removed prior to surgery. Artificial nails or gel coating may interfere with anesthesia's ability to adequately monitor your vital signs. ? ?[facility] is not responsible for any belongings or valuables. .  ? ?Do NOT Smoke (Tobacco/Vaping)   24 hours prior to your procedure ? ?If you use a CPAP at night, you may bring your mask for your overnight stay. ?  ?Contacts, glasses, hearing aids, dentures or partials may not be worn into surgery, please bring cases for these belongings ?  ?For patients admitted to the hospital, discharge time will be determined by your treatment team. ?  ?Patients discharged the day of surgery will not be allowed to drive home, and someone needs to stay with them for 24 hours. ? ? ?SURGICAL WAITING ROOM VISITATION ?Patients having surgery or a procedure in a hospital may have two support people. ?Children under the age of 24 must have an adult with them who is not the patient. ?They may stay in the waiting area during the procedure and may switch out with other visitors. If the patient needs to stay at the hospital during part of their recovery, the visitor guidelines for inpatient rooms apply. ? ?Please refer to the [facility] website for the visitor guidelines for Inpatients (after your surgery is over and you are in a regular room).  ? ? ? ? ? ?Special instructions:   ? ?Oral Hygiene is also important to reduce your risk of infection.  Remember - BRUSH YOUR TEETH THE MORNING OF SURGERY WITH YOUR REGULAR TOOTHPASTE ? ? ?[facility]- Preparing For Surgery ? ?Before surgery, you can play an important role. Because skin is not sterile, your skin needs to be as free of germs as possible. You can reduce the number of germs on your skin by washing with CHG (chlorahexidine gluconate) Soap before surgery.  CHG is an antiseptic cleaner which kills germs and bonds with the skin to continue killing germs even after washing.   ? ? ?Please do not use if you have an allergy to CHG or antibacterial soaps. If your skin becomes reddened/irritated stop using the CHG.  ?Do not shave (including legs and underarms) for at least 48 hours prior to first CHG shower. It is OK to shave your face. ? ?Please follow these instructions carefully. ?  ? ?  Shower the NIGHT BEFORE SURGERY and the MORNING OF SURGERY with CHG Soap.  ? If you chose to wash your hair, wash your hair first as usual with your normal shampoo. After you shampoo, rinse your hair and body thoroughly to remove the shampoo.  Then [employer] and genitals (private parts) with your normal soap and rinse thoroughly to remove soap. ? ?After that Use CHG Soap as you would any other liquid soap. You can apply CHG directly to the skin and wash gently with a scrungie or a clean washcloth.  ? ?Apply the CHG Soap to your body ONLY FROM THE NECK DOWN.  Do not use on open wounds or open sores. Avoid contact with your eyes, ears, mouth and genitals (private parts). Wash Face and genitals (private parts)  with your normal soap.  ? ?Wash thoroughly, paying special attention to the area where your surgery will be performed. ? ?Thoroughly  rinse your body with warm water from the neck down. ? ?DO NOT shower/wash with your normal soap after using and rinsing off the CHG Soap. ? ?Pat yourself dry with a CLEAN TOWEL. ? ?Wear CLEAN PAJAMAS to bed the night before surgery ? ?Place CLEAN SHEETS on your bed the night before your surgery ? ?DO NOT SLEEP WITH PETS. ? ? ?Day of Surgery: ? ?Take a shower with CHG soap. ?Wear Clean/Comfortable clothing the morning of surgery ?Do not apply any deodorants/lotions.   ?Remember to brush your teeth WITH YOUR REGULAR TOOTHPASTE. ? ? ? ?If you received a COVID test during your pre-op visit, it is requested that you wear a mask when out in public, stay away from anyone that may not be feeling well, and notify your surgeon if you develop symptoms. If you have been in contact with anyone that has tested positive in the last 10 days, please notify your surgeon. ? ?  ?Please read over the following fact sheets that you were given.  ? ?

## 2022-03-03 ENCOUNTER — Encounter (HOSPITAL_COMMUNITY): Payer: Self-pay

## 2022-03-03 ENCOUNTER — Other Ambulatory Visit: Payer: Self-pay

## 2022-03-03 ENCOUNTER — Ambulatory Visit (HOSPITAL_COMMUNITY)
Admission: RE | Admit: 2022-03-03 | Discharge: 2022-03-03 | Disposition: A | Discharge status: Home / Self Care | Payer: Medicare PPO | Source: Ambulatory Visit | Attending: Thoracic Surgery (Cardiothoracic Vascular Surgery) | Admitting: Thoracic Surgery (Cardiothoracic Vascular Surgery)

## 2022-03-03 ENCOUNTER — Ambulatory Visit (HOSPITAL_BASED_OUTPATIENT_CLINIC_OR_DEPARTMENT_OTHER)
Admission: RE | Admit: 2022-03-03 | Discharge: 2022-03-03 | Disposition: A | Discharge status: Home / Self Care | Payer: Medicare PPO | Source: Ambulatory Visit | Attending: Thoracic Surgery (Cardiothoracic Vascular Surgery) | Admitting: Thoracic Surgery (Cardiothoracic Vascular Surgery)

## 2022-03-03 VITALS — BP 140/89 | HR 71 | Temp 98.0°F | Resp 18 | Ht 73.0 in | Wt 211.2 lb

## 2022-03-03 DIAGNOSIS — I252 Old myocardial infarction: Secondary | ICD-10-CM | POA: Insufficient documentation

## 2022-03-03 DIAGNOSIS — E785 Hyperlipidemia, unspecified: Secondary | ICD-10-CM | POA: Insufficient documentation

## 2022-03-03 DIAGNOSIS — I10 Essential (primary) hypertension: Secondary | ICD-10-CM | POA: Insufficient documentation

## 2022-03-03 DIAGNOSIS — I2510 Atherosclerotic heart disease of native coronary artery without angina pectoris: Secondary | ICD-10-CM | POA: Insufficient documentation

## 2022-03-03 DIAGNOSIS — Z20822 Contact with and (suspected) exposure to covid-19: Secondary | ICD-10-CM | POA: Insufficient documentation

## 2022-03-03 DIAGNOSIS — Z87891 Personal history of nicotine dependence: Secondary | ICD-10-CM | POA: Insufficient documentation

## 2022-03-03 DIAGNOSIS — Z01818 Encounter for other preprocedural examination: Secondary | ICD-10-CM | POA: Insufficient documentation

## 2022-03-03 DIAGNOSIS — Z955 Presence of coronary angioplasty implant and graft: Secondary | ICD-10-CM | POA: Insufficient documentation

## 2022-03-03 DIAGNOSIS — E119 Type 2 diabetes mellitus without complications: Secondary | ICD-10-CM | POA: Insufficient documentation

## 2022-03-03 LAB — CBC
HCT: 43.7 % (ref 39.0–52.0)
Hemoglobin: 14.4 g/dL (ref 13.0–17.0)
MCH: 30.6 pg (ref 26.0–34.0)
MCHC: 33 g/dL (ref 30.0–36.0)
MCV: 93 fL (ref 80.0–100.0)
Platelets: 122 10*3/uL — ABNORMAL LOW (ref 150–400)
RBC: 4.7 MIL/uL (ref 4.22–5.81)
RDW: 12.2 % (ref 11.5–15.5)
WBC: 6.6 10*3/uL (ref 4.0–10.5)
nRBC: 0 % (ref 0.0–0.2)

## 2022-03-03 LAB — URINALYSIS, ROUTINE W REFLEX MICROSCOPIC
Bilirubin Urine: NEGATIVE
Glucose, UA: NEGATIVE mg/dL
Hgb urine dipstick: NEGATIVE
Ketones, ur: NEGATIVE mg/dL
Leukocytes,Ua: NEGATIVE
Nitrite: NEGATIVE
Protein, ur: NEGATIVE mg/dL
Specific Gravity, Urine: 1.009 (ref 1.005–1.030)
pH: 5 (ref 5.0–8.0)

## 2022-03-03 LAB — COMPREHENSIVE METABOLIC PANEL
ALT: 48 U/L — ABNORMAL HIGH (ref 0–44)
AST: 57 U/L — ABNORMAL HIGH (ref 15–41)
Albumin: 4.2 g/dL (ref 3.5–5.0)
Alkaline Phosphatase: 61 U/L (ref 38–126)
Anion gap: 10 (ref 5–15)
BUN: 15 mg/dL (ref 8–23)
CO2: 20 mmol/L — ABNORMAL LOW (ref 22–32)
Calcium: 9.5 mg/dL (ref 8.9–10.3)
Chloride: 105 mmol/L (ref 98–111)
Creatinine, Ser: 0.81 mg/dL (ref 0.61–1.24)
GFR, Estimated: >60 mL/min (ref >60)
Glucose, Bld: 158 mg/dL — ABNORMAL HIGH (ref 70–99)
Potassium: 4.2 mmol/L (ref 3.5–5.1)
Sodium: 135 mmol/L (ref 135–145)
Total Bilirubin: 1.3 mg/dL — ABNORMAL HIGH (ref 0.3–1.2)
Total Protein: 6.8 g/dL (ref 6.5–8.1)

## 2022-03-03 LAB — SURGICAL PCR SCREEN
MRSA, PCR: NEGATIVE
Staphylococcus aureus: NEGATIVE

## 2022-03-03 LAB — BLOOD GAS, ARTERIAL
Acid-base deficit: 1.1 mmol/L (ref 0.0–2.0)
Bicarbonate: 22.6 mmol/L (ref 20.0–28.0)
Drawn by: 60286
O2 Saturation: 100 %
Patient temperature: 37
pCO2 arterial: 34 mmHg (ref 32–48)
pH, Arterial: 7.43 (ref 7.35–7.45)
pO2, Arterial: 115 mmHg — ABNORMAL HIGH (ref 83–108)

## 2022-03-03 LAB — APTT: aPTT: 28 seconds (ref 24–36)

## 2022-03-03 LAB — PROTIME-INR
INR: 1.1 (ref 0.8–1.2)
Prothrombin Time: 13.7 seconds (ref 11.4–15.2)

## 2022-03-03 LAB — GLUCOSE, CAPILLARY: Glucose-Capillary: 190 mg/dL — ABNORMAL HIGH (ref 70–99)

## 2022-03-03 LAB — HEMOGLOBIN A1C
Hgb A1c MFr Bld: 6.9 % — ABNORMAL HIGH (ref 4.8–5.6)
Mean Plasma Glucose: 151.33 mg/dL

## 2022-03-03 LAB — SARS CORONAVIRUS 2 (TAT 6-24 HRS): SARS Coronavirus 2: NEGATIVE

## 2022-03-03 NOTE — Progress Notes (Signed)
PCP - Wilhemena Durie MD ?Cardiologist - Peter Martinique MD ? ?PPM/ICD - denies ?Device Orders -  ?Rep Notified -  ? ?Chest x-ray - 03/03/22 ?EKG - 03/03/22 ?Stress Test - 2017 ?ECHO - 02/06/22 ?Cardiac Cath - 02/06/22 ? ?Sleep Study - none ?CPAP -  ? ?Fasting Blood Sugar - 130-150 ?Checks Blood Sugar twice a month ? ?Blood Thinner Instructions:n/a  ?Aspirin Instructions:Pt states he will call Dr. Leonarda Salon office for instructions ? ?ERAS Protcol -no ?PRE-SURGERY Ensure or G2-  ? ?COVID TEST- 03/03/22 ? ? ?Anesthesia review: yes - cardiac history ? ?Patient denies shortness of breath, fever, cough and chest pain at PAT appointment ? ? ?All instructions explained to the patient, with a verbal understanding of the material. Patient agrees to go over the instructions while at home for a better understanding. Patient also instructed to wear a mask when out in public after being tested for COVID-19. The opportunity to ask questions was provided. ?  ?

## 2022-03-04 MED ORDER — VANCOMYCIN HCL 1500 MG/300ML IV SOLN
1500.0000 mg | INTRAVENOUS | Status: AC
  Administered 2022-03-05: 1500 mg via INTRAVENOUS
  Filled 2022-03-04: qty 300

## 2022-03-04 MED ORDER — PAPAVERINE HCL 30 MG/ML IJ SOLN
INTRAMUSCULAR | Status: DC
  Filled 2022-03-04: qty 2.5

## 2022-03-04 MED ORDER — CEFAZOLIN SODIUM-DEXTROSE 2-4 GM/100ML-% IV SOLN
2.0000 g | INTRAVENOUS | Status: AC
Start: 2022-03-05 — End: 2022-03-05
  Administered 2022-03-05 (×2): 2 g via INTRAVENOUS
  Filled 2022-03-04: qty 100

## 2022-03-04 MED ORDER — DEXMEDETOMIDINE HCL IN NACL 400 MCG/100ML IV SOLN
0.1000 ug/kg/h | INTRAVENOUS | Status: AC
  Administered 2022-03-05: .5 ug/kg/h via INTRAVENOUS
  Filled 2022-03-04: qty 100

## 2022-03-04 MED ORDER — NOREPINEPHRINE 4 MG/250ML-% IV SOLN
0.0000 ug/min | INTRAVENOUS | Status: DC
  Filled 2022-03-04: qty 250

## 2022-03-04 MED ORDER — INSULIN REGULAR(HUMAN) IN NACL 100-0.9 UT/100ML-% IV SOLN
INTRAVENOUS | Status: AC
  Administered 2022-03-05: 6.5 [IU]/h via INTRAVENOUS
  Filled 2022-03-04: qty 100

## 2022-03-04 MED ORDER — NITROGLYCERIN IN D5W 200-5 MCG/ML-% IV SOLN
2.0000 ug/min | INTRAVENOUS | Status: AC
  Administered 2022-03-05: 5 ug/min via INTRAVENOUS
  Filled 2022-03-04: qty 250

## 2022-03-04 MED ORDER — SODIUM CHLORIDE 0.9 % IV SOLN
1.5000 mg/kg/h | INTRAVENOUS | Status: AC
  Administered 2022-03-05: 1.5 mg/kg/h via INTRAVENOUS
  Filled 2022-03-04: qty 25

## 2022-03-04 MED ORDER — PHENYLEPHRINE HCL-NACL 20-0.9 MG/250ML-% IV SOLN
30.0000 ug/min | INTRAVENOUS | Status: AC
  Administered 2022-03-05: 25 ug/min via INTRAVENOUS
  Filled 2022-03-04: qty 250

## 2022-03-04 MED ORDER — CEFAZOLIN SODIUM-DEXTROSE 2-4 GM/100ML-% IV SOLN
2.0000 g | INTRAVENOUS | Status: DC
  Filled 2022-03-04: qty 100

## 2022-03-04 MED ORDER — MAGNESIUM SULFATE 50 % IJ SOLN
40.0000 meq | INTRAMUSCULAR | Status: DC
  Filled 2022-03-04 (×2): qty 9.85

## 2022-03-04 MED ORDER — HEPARIN 30,000 UNITS/1000 ML (OHS) CELLSAVER SOLUTION
Status: DC
  Filled 2022-03-04: qty 1000

## 2022-03-04 MED ORDER — MANNITOL 20 % IV SOLN
INTRAVENOUS | Status: DC
  Filled 2022-03-04: qty 13

## 2022-03-04 MED ORDER — TRANEXAMIC ACID (OHS) PUMP PRIME SOLUTION
2.0000 mg/kg | INTRAVENOUS | Status: DC
  Filled 2022-03-04: qty 1.92

## 2022-03-04 MED ORDER — MILRINONE LACTATE IN DEXTROSE 20-5 MG/100ML-% IV SOLN
0.3000 ug/kg/min | INTRAVENOUS | Status: DC
  Filled 2022-03-04: qty 100

## 2022-03-04 MED ORDER — EPINEPHRINE HCL 5 MG/250ML IV SOLN IN NS
0.0000 ug/min | INTRAVENOUS | Status: DC
  Filled 2022-03-04: qty 250

## 2022-03-04 MED ORDER — POTASSIUM CHLORIDE 2 MEQ/ML IV SOLN
80.0000 meq | INTRAVENOUS | Status: DC
  Filled 2022-03-04: qty 40

## 2022-03-04 MED ORDER — TRANEXAMIC ACID (OHS) BOLUS VIA INFUSION
15.0000 mg/kg | INTRAVENOUS | Status: AC
  Administered 2022-03-05: 1437 mg via INTRAVENOUS
  Filled 2022-03-04: qty 1437

## 2022-03-05 ENCOUNTER — Encounter (HOSPITAL_COMMUNITY): Payer: Self-pay | Admitting: Thoracic Surgery (Cardiothoracic Vascular Surgery)

## 2022-03-05 ENCOUNTER — Inpatient Hospital Stay (HOSPITAL_COMMUNITY): Payer: Medicare PPO | Admitting: Anesthesiology

## 2022-03-05 ENCOUNTER — Other Ambulatory Visit: Payer: Self-pay

## 2022-03-05 ENCOUNTER — Inpatient Hospital Stay (HOSPITAL_COMMUNITY): Payer: Medicare PPO | Admitting: Physician Assistant

## 2022-03-05 ENCOUNTER — Inpatient Hospital Stay (HOSPITAL_COMMUNITY): Payer: Medicare PPO

## 2022-03-05 ENCOUNTER — Inpatient Hospital Stay (HOSPITAL_COMMUNITY)
Admission: RE | Admit: 2022-03-05 | Discharge: 2022-03-09 | DRG: 236 | Disposition: A | Discharge status: Home / Self Care | Payer: Medicare PPO | Attending: Thoracic Surgery (Cardiothoracic Vascular Surgery) | Admitting: Thoracic Surgery (Cardiothoracic Vascular Surgery)

## 2022-03-05 ENCOUNTER — Inpatient Hospital Stay (HOSPITAL_COMMUNITY)
Admission: RE | Disposition: A | Discharge status: Home / Self Care | Payer: Self-pay | Source: Ambulatory Visit | Attending: Thoracic Surgery (Cardiothoracic Vascular Surgery)

## 2022-03-05 DIAGNOSIS — E119 Type 2 diabetes mellitus without complications: Secondary | ICD-10-CM | POA: Diagnosis present

## 2022-03-05 DIAGNOSIS — E039 Hypothyroidism, unspecified: Secondary | ICD-10-CM | POA: Diagnosis present

## 2022-03-05 DIAGNOSIS — F410 Panic disorder [episodic paroxysmal anxiety] without agoraphobia: Secondary | ICD-10-CM | POA: Diagnosis present

## 2022-03-05 DIAGNOSIS — K219 Gastro-esophageal reflux disease without esophagitis: Secondary | ICD-10-CM | POA: Diagnosis present

## 2022-03-05 DIAGNOSIS — D696 Thrombocytopenia, unspecified: Secondary | ICD-10-CM | POA: Diagnosis present

## 2022-03-05 DIAGNOSIS — I10 Essential (primary) hypertension: Secondary | ICD-10-CM | POA: Diagnosis present

## 2022-03-05 DIAGNOSIS — I2510 Atherosclerotic heart disease of native coronary artery without angina pectoris: Secondary | ICD-10-CM | POA: Diagnosis present

## 2022-03-05 DIAGNOSIS — Z20822 Contact with and (suspected) exposure to covid-19: Secondary | ICD-10-CM | POA: Diagnosis present

## 2022-03-05 DIAGNOSIS — Z951 Presence of aortocoronary bypass graft: Principal | ICD-10-CM

## 2022-03-05 DIAGNOSIS — E7800 Pure hypercholesterolemia, unspecified: Secondary | ICD-10-CM | POA: Diagnosis present

## 2022-03-05 DIAGNOSIS — Z85828 Personal history of other malignant neoplasm of skin: Secondary | ICD-10-CM | POA: Diagnosis not present

## 2022-03-05 DIAGNOSIS — Z888 Allergy status to other drugs, medicaments and biological substances status: Secondary | ICD-10-CM

## 2022-03-05 DIAGNOSIS — I25810 Atherosclerosis of coronary artery bypass graft(s) without angina pectoris: Secondary | ICD-10-CM | POA: Diagnosis present

## 2022-03-05 DIAGNOSIS — M109 Gout, unspecified: Secondary | ICD-10-CM | POA: Diagnosis present

## 2022-03-05 DIAGNOSIS — Z7982 Long term (current) use of aspirin: Secondary | ICD-10-CM

## 2022-03-05 DIAGNOSIS — Z7984 Long term (current) use of oral hypoglycemic drugs: Secondary | ICD-10-CM | POA: Diagnosis not present

## 2022-03-05 DIAGNOSIS — Z87891 Personal history of nicotine dependence: Secondary | ICD-10-CM | POA: Diagnosis not present

## 2022-03-05 DIAGNOSIS — Z800 Family history of malignant neoplasm of digestive organs: Secondary | ICD-10-CM | POA: Diagnosis not present

## 2022-03-05 DIAGNOSIS — D689 Coagulation defect, unspecified: Secondary | ICD-10-CM | POA: Diagnosis present

## 2022-03-05 DIAGNOSIS — D62 Acute posthemorrhagic anemia: Secondary | ICD-10-CM | POA: Diagnosis not present

## 2022-03-05 DIAGNOSIS — Z79899 Other long term (current) drug therapy: Secondary | ICD-10-CM

## 2022-03-05 DIAGNOSIS — I252 Old myocardial infarction: Secondary | ICD-10-CM | POA: Diagnosis not present

## 2022-03-05 DIAGNOSIS — I310 Chronic adhesive pericarditis: Secondary | ICD-10-CM | POA: Diagnosis present

## 2022-03-05 DIAGNOSIS — I878 Other specified disorders of veins: Secondary | ICD-10-CM | POA: Diagnosis present

## 2022-03-05 DIAGNOSIS — Z955 Presence of coronary angioplasty implant and graft: Secondary | ICD-10-CM | POA: Diagnosis not present

## 2022-03-05 DIAGNOSIS — I700 Atherosclerosis of aorta: Secondary | ICD-10-CM | POA: Diagnosis present

## 2022-03-05 DIAGNOSIS — Z8249 Family history of ischemic heart disease and other diseases of the circulatory system: Secondary | ICD-10-CM | POA: Diagnosis not present

## 2022-03-05 DIAGNOSIS — Z79890 Hormone replacement therapy (postmenopausal): Secondary | ICD-10-CM

## 2022-03-05 HISTORY — PX: ENDOVEIN HARVEST OF GREATER SAPHENOUS VEIN: SHX5059

## 2022-03-05 HISTORY — PX: TEE WITHOUT CARDIOVERSION: SHX5443

## 2022-03-05 HISTORY — PX: RADIAL ARTERY HARVEST: SHX5067

## 2022-03-05 HISTORY — PX: CORONARY ARTERY BYPASS GRAFT: SHX141

## 2022-03-05 LAB — POCT I-STAT 7, (LYTES, BLD GAS, ICA,H+H)
Acid-Base Excess: 1 mmol/L (ref 0.0–2.0)
Acid-Base Excess: 2 mmol/L (ref 0.0–2.0)
Acid-Base Excess: 3 mmol/L — ABNORMAL HIGH (ref 0.0–2.0)
Acid-Base Excess: 1 mmol/L (ref 0.0–2.0)
Acid-Base Excess: 3 mmol/L — ABNORMAL HIGH (ref 0.0–2.0)
Acid-Base Excess: 0 mmol/L (ref 0.0–2.0)
Acid-base deficit: 1 mmol/L (ref 0.0–2.0)
Bicarbonate: 26 mmol/L (ref 20.0–28.0)
Bicarbonate: 26.7 mmol/L (ref 20.0–28.0)
Bicarbonate: 27.2 mmol/L (ref 20.0–28.0)
Bicarbonate: 26.5 mmol/L (ref 20.0–28.0)
Bicarbonate: 26.6 mmol/L (ref 20.0–28.0)
Bicarbonate: 25.3 mmol/L (ref 20.0–28.0)
Bicarbonate: 23.9 mmol/L (ref 20.0–28.0)
Calcium, Ion: 1.03 mmol/L — ABNORMAL LOW (ref 1.15–1.40)
Calcium, Ion: 1.02 mmol/L — ABNORMAL LOW (ref 1.15–1.40)
Calcium, Ion: 1.04 mmol/L — ABNORMAL LOW (ref 1.15–1.40)
Calcium, Ion: 1.01 mmol/L — ABNORMAL LOW (ref 1.15–1.40)
Calcium, Ion: 1.02 mmol/L — ABNORMAL LOW (ref 1.15–1.40)
Calcium, Ion: 1 mmol/L — ABNORMAL LOW (ref 1.15–1.40)
Calcium, Ion: 1.01 mmol/L — ABNORMAL LOW (ref 1.15–1.40)
HCT: 26 % — ABNORMAL LOW (ref 39.0–52.0)
HCT: 26 % — ABNORMAL LOW (ref 39.0–52.0)
HCT: 26 % — ABNORMAL LOW (ref 39.0–52.0)
HCT: 25 % — ABNORMAL LOW (ref 39.0–52.0)
HCT: 26 % — ABNORMAL LOW (ref 39.0–52.0)
HCT: 21 % — ABNORMAL LOW (ref 39.0–52.0)
HCT: 28 % — ABNORMAL LOW (ref 39.0–52.0)
Hemoglobin: 8.8 g/dL — ABNORMAL LOW (ref 13.0–17.0)
Hemoglobin: 8.8 g/dL — ABNORMAL LOW (ref 13.0–17.0)
Hemoglobin: 8.8 g/dL — ABNORMAL LOW (ref 13.0–17.0)
Hemoglobin: 8.5 g/dL — ABNORMAL LOW (ref 13.0–17.0)
Hemoglobin: 8.8 g/dL — ABNORMAL LOW (ref 13.0–17.0)
Hemoglobin: 7.1 g/dL — ABNORMAL LOW (ref 13.0–17.0)
Hemoglobin: 9.5 g/dL — ABNORMAL LOW (ref 13.0–17.0)
O2 Saturation: 100 %
O2 Saturation: 100 %
O2 Saturation: 100 %
O2 Saturation: 100 %
O2 Saturation: 100 %
O2 Saturation: 100 %
O2 Saturation: 100 %
Patient temperature: 36.2
Potassium: 3.8 mmol/L (ref 3.5–5.1)
Potassium: 4.5 mmol/L (ref 3.5–5.1)
Potassium: 5.1 mmol/L (ref 3.5–5.1)
Potassium: 4.6 mmol/L (ref 3.5–5.1)
Potassium: 4.6 mmol/L (ref 3.5–5.1)
Potassium: 4.3 mmol/L (ref 3.5–5.1)
Potassium: 4.4 mmol/L (ref 3.5–5.1)
Sodium: 138 mmol/L (ref 135–145)
Sodium: 137 mmol/L (ref 135–145)
Sodium: 138 mmol/L (ref 135–145)
Sodium: 137 mmol/L (ref 135–145)
Sodium: 138 mmol/L (ref 135–145)
Sodium: 140 mmol/L (ref 135–145)
Sodium: 139 mmol/L (ref 135–145)
TCO2: 27 mmol/L (ref 22–32)
TCO2: 28 mmol/L (ref 22–32)
TCO2: 28 mmol/L (ref 22–32)
TCO2: 28 mmol/L (ref 22–32)
TCO2: 28 mmol/L (ref 22–32)
TCO2: 27 mmol/L (ref 22–32)
TCO2: 25 mmol/L (ref 22–32)
pCO2 arterial: 40.5 mmHg (ref 32–48)
pCO2 arterial: 39 mmHg (ref 32–48)
pCO2 arterial: 39.7 mmHg (ref 32–48)
pCO2 arterial: 36.6 mmHg (ref 32–48)
pCO2 arterial: 44.7 mmHg (ref 32–48)
pCO2 arterial: 43.2 mmHg (ref 32–48)
pCO2 arterial: 39.9 mmHg (ref 32–48)
pH, Arterial: 7.416 (ref 7.35–7.45)
pH, Arterial: 7.436 (ref 7.35–7.45)
pH, Arterial: 7.452 — ABNORMAL HIGH (ref 7.35–7.45)
pH, Arterial: 7.383 (ref 7.35–7.45)
pH, Arterial: 7.468 — ABNORMAL HIGH (ref 7.35–7.45)
pH, Arterial: 7.375 (ref 7.35–7.45)
pH, Arterial: 7.383 (ref 7.35–7.45)
pO2, Arterial: 446 mmHg — ABNORMAL HIGH (ref 83–108)
pO2, Arterial: 328 mmHg — ABNORMAL HIGH (ref 83–108)
pO2, Arterial: 343 mmHg — ABNORMAL HIGH (ref 83–108)
pO2, Arterial: 257 mmHg — ABNORMAL HIGH (ref 83–108)
pO2, Arterial: 311 mmHg — ABNORMAL HIGH (ref 83–108)
pO2, Arterial: 320 mmHg — ABNORMAL HIGH (ref 83–108)
pO2, Arterial: 187 mmHg — ABNORMAL HIGH (ref 83–108)

## 2022-03-05 LAB — POCT I-STAT, CHEM 8
BUN: 13 mg/dL (ref 8–23)
BUN: 12 mg/dL (ref 8–23)
BUN: 12 mg/dL (ref 8–23)
BUN: 11 mg/dL (ref 8–23)
BUN: 10 mg/dL (ref 8–23)
BUN: 11 mg/dL (ref 8–23)
Calcium, Ion: 1.21 mmol/L (ref 1.15–1.40)
Calcium, Ion: 1.25 mmol/L (ref 1.15–1.40)
Calcium, Ion: 1.23 mmol/L (ref 1.15–1.40)
Calcium, Ion: 1.04 mmol/L — ABNORMAL LOW (ref 1.15–1.40)
Calcium, Ion: 0.94 mmol/L — ABNORMAL LOW (ref 1.15–1.40)
Calcium, Ion: 0.99 mmol/L — ABNORMAL LOW (ref 1.15–1.40)
Chloride: 102 mmol/L (ref 98–111)
Chloride: 104 mmol/L (ref 98–111)
Chloride: 103 mmol/L (ref 98–111)
Chloride: 103 mmol/L (ref 98–111)
Chloride: 103 mmol/L (ref 98–111)
Chloride: 102 mmol/L (ref 98–111)
Creatinine, Ser: 0.5 mg/dL — ABNORMAL LOW (ref 0.61–1.24)
Creatinine, Ser: 0.5 mg/dL — ABNORMAL LOW (ref 0.61–1.24)
Creatinine, Ser: 0.5 mg/dL — ABNORMAL LOW (ref 0.61–1.24)
Creatinine, Ser: 0.4 mg/dL — ABNORMAL LOW (ref 0.61–1.24)
Creatinine, Ser: 0.4 mg/dL — ABNORMAL LOW (ref 0.61–1.24)
Creatinine, Ser: 0.5 mg/dL — ABNORMAL LOW (ref 0.61–1.24)
Glucose, Bld: 176 mg/dL — ABNORMAL HIGH (ref 70–99)
Glucose, Bld: 152 mg/dL — ABNORMAL HIGH (ref 70–99)
Glucose, Bld: 105 mg/dL — ABNORMAL HIGH (ref 70–99)
Glucose, Bld: 98 mg/dL (ref 70–99)
Glucose, Bld: 121 mg/dL — ABNORMAL HIGH (ref 70–99)
Glucose, Bld: 113 mg/dL — ABNORMAL HIGH (ref 70–99)
HCT: 36 % — ABNORMAL LOW (ref 39.0–52.0)
HCT: 34 % — ABNORMAL LOW (ref 39.0–52.0)
HCT: 32 % — ABNORMAL LOW (ref 39.0–52.0)
HCT: 26 % — ABNORMAL LOW (ref 39.0–52.0)
HCT: 30 % — ABNORMAL LOW (ref 39.0–52.0)
HCT: 24 % — ABNORMAL LOW (ref 39.0–52.0)
Hemoglobin: 12.2 g/dL — ABNORMAL LOW (ref 13.0–17.0)
Hemoglobin: 11.6 g/dL — ABNORMAL LOW (ref 13.0–17.0)
Hemoglobin: 10.9 g/dL — ABNORMAL LOW (ref 13.0–17.0)
Hemoglobin: 8.8 g/dL — ABNORMAL LOW (ref 13.0–17.0)
Hemoglobin: 10.2 g/dL — ABNORMAL LOW (ref 13.0–17.0)
Hemoglobin: 8.2 g/dL — ABNORMAL LOW (ref 13.0–17.0)
Potassium: 4 mmol/L (ref 3.5–5.1)
Potassium: 3.7 mmol/L (ref 3.5–5.1)
Potassium: 3.6 mmol/L (ref 3.5–5.1)
Potassium: 5.1 mmol/L (ref 3.5–5.1)
Potassium: 4.6 mmol/L (ref 3.5–5.1)
Potassium: 4.3 mmol/L (ref 3.5–5.1)
Sodium: 138 mmol/L (ref 135–145)
Sodium: 139 mmol/L (ref 135–145)
Sodium: 140 mmol/L (ref 135–145)
Sodium: 137 mmol/L (ref 135–145)
Sodium: 137 mmol/L (ref 135–145)
Sodium: 139 mmol/L (ref 135–145)
TCO2: 25 mmol/L (ref 22–32)
TCO2: 26 mmol/L (ref 22–32)
TCO2: 26 mmol/L (ref 22–32)
TCO2: 26 mmol/L (ref 22–32)
TCO2: 26 mmol/L (ref 22–32)
TCO2: 25 mmol/L (ref 22–32)

## 2022-03-05 LAB — HEMOGLOBIN AND HEMATOCRIT, BLOOD
HCT: 26.9 % — ABNORMAL LOW (ref 39.0–52.0)
Hemoglobin: 9.4 g/dL — ABNORMAL LOW (ref 13.0–17.0)

## 2022-03-05 LAB — PREPARE RBC (CROSSMATCH)

## 2022-03-05 LAB — POCT I-STAT EG7
Acid-base deficit: 1 mmol/L (ref 0.0–2.0)
Bicarbonate: 24.6 mmol/L (ref 20.0–28.0)
Calcium, Ion: 1.02 mmol/L — ABNORMAL LOW (ref 1.15–1.40)
HCT: 26 % — ABNORMAL LOW (ref 39.0–52.0)
Hemoglobin: 8.8 g/dL — ABNORMAL LOW (ref 13.0–17.0)
O2 Saturation: 87 %
Potassium: 3.5 mmol/L (ref 3.5–5.1)
Sodium: 140 mmol/L (ref 135–145)
TCO2: 26 mmol/L (ref 22–32)
pCO2, Ven: 44.4 mmHg (ref 44–60)
pH, Ven: 7.351 (ref 7.25–7.43)
pO2, Ven: 56 mmHg — ABNORMAL HIGH (ref 32–45)

## 2022-03-05 LAB — CBC
HCT: 30.1 % — ABNORMAL LOW (ref 39.0–52.0)
Hemoglobin: 9.9 g/dL — ABNORMAL LOW (ref 13.0–17.0)
MCH: 30.6 pg (ref 26.0–34.0)
MCHC: 32.9 g/dL (ref 30.0–36.0)
MCV: 92.9 fL (ref 80.0–100.0)
Platelets: 84 10*3/uL — ABNORMAL LOW (ref 150–400)
RBC: 3.24 MIL/uL — ABNORMAL LOW (ref 4.22–5.81)
RDW: 12.4 % (ref 11.5–15.5)
WBC: 6.2 10*3/uL (ref 4.0–10.5)
nRBC: 0 % (ref 0.0–0.2)

## 2022-03-05 LAB — APTT: aPTT: 32 seconds (ref 24–36)

## 2022-03-05 LAB — GLUCOSE, CAPILLARY
Glucose-Capillary: 200 mg/dL — ABNORMAL HIGH (ref 70–99)
Glucose-Capillary: 125 mg/dL — ABNORMAL HIGH (ref 70–99)
Glucose-Capillary: 128 mg/dL — ABNORMAL HIGH (ref 70–99)
Glucose-Capillary: 139 mg/dL — ABNORMAL HIGH (ref 70–99)
Glucose-Capillary: 112 mg/dL — ABNORMAL HIGH (ref 70–99)
Glucose-Capillary: 131 mg/dL — ABNORMAL HIGH (ref 70–99)
Glucose-Capillary: 126 mg/dL — ABNORMAL HIGH (ref 70–99)

## 2022-03-05 LAB — PLATELET COUNT: Platelets: 101 10*3/uL — ABNORMAL LOW (ref 150–400)

## 2022-03-05 LAB — PROTIME-INR
INR: 1.5 — ABNORMAL HIGH (ref 0.8–1.2)
Prothrombin Time: 17.6 seconds — ABNORMAL HIGH (ref 11.4–15.2)

## 2022-03-05 SURGERY — CORONARY ARTERY BYPASS GRAFTING (CABG)
Anesthesia: General | Site: Chest | Laterality: Right

## 2022-03-05 MED ORDER — LIDOCAINE 2% (20 MG/ML) 5 ML SYRINGE
INTRAMUSCULAR | Status: DC | PRN
  Administered 2022-03-05: 60 mg via INTRAVENOUS

## 2022-03-05 MED ORDER — OXYCODONE HCL 5 MG PO TABS
5.0000 mg | ORAL_TABLET | ORAL | Status: DC | PRN
  Administered 2022-03-06 (×2): 10 mg via ORAL
  Filled 2022-03-05 (×2): qty 2

## 2022-03-05 MED ORDER — LACTATED RINGERS IV SOLN: INTRAVENOUS | Status: DC

## 2022-03-05 MED ORDER — GLYCOPYRROLATE 0.2 MG/ML IJ SOLN
INTRAMUSCULAR | Status: DC | PRN
  Administered 2022-03-05: .2 mg via INTRAVENOUS

## 2022-03-05 MED ORDER — SODIUM CHLORIDE 0.9% IV SOLUTION: Freq: Once | INTRAVENOUS | Status: AC

## 2022-03-05 MED ORDER — FENTANYL CITRATE (PF) 250 MCG/5ML IJ SOLN
INTRAMUSCULAR | Status: AC
  Filled 2022-03-05: qty 5

## 2022-03-05 MED ORDER — ONDANSETRON HCL 4 MG/2ML IJ SOLN
4.0000 mg | Freq: Four times a day (QID) | INTRAMUSCULAR | Status: DC | PRN
  Administered 2022-03-06: 4 mg via INTRAVENOUS
  Filled 2022-03-05: qty 2

## 2022-03-05 MED ORDER — SODIUM CHLORIDE 0.9 % IV SOLN: 10.0000 mL/h | Freq: Once | INTRAVENOUS | Status: DC

## 2022-03-05 MED ORDER — CEFAZOLIN SODIUM-DEXTROSE 2-4 GM/100ML-% IV SOLN
2.0000 g | Freq: Three times a day (TID) | INTRAVENOUS | Status: AC
  Administered 2022-03-05 – 2022-03-07 (×6): 2 g via INTRAVENOUS
  Filled 2022-03-05 (×6): qty 100

## 2022-03-05 MED ORDER — HEMOSTATIC AGENTS (NO CHARGE) OPTIME
TOPICAL | Status: DC | PRN
  Administered 2022-03-05: 1 via TOPICAL

## 2022-03-05 MED ORDER — MAGNESIUM SULFATE 4 GM/100ML IV SOLN
4.0000 g | Freq: Once | INTRAVENOUS | Status: AC
  Administered 2022-03-05: 4 g via INTRAVENOUS
  Filled 2022-03-05: qty 100

## 2022-03-05 MED ORDER — ALBUMIN HUMAN 5 % IV SOLN
INTRAVENOUS | Status: AC
  Administered 2022-03-05: 12.5 g
  Filled 2022-03-05: qty 500

## 2022-03-05 MED ORDER — HEPARIN SODIUM (PORCINE) 1000 UNIT/ML IJ SOLN
INTRAMUSCULAR | Status: AC
  Filled 2022-03-05: qty 10

## 2022-03-05 MED ORDER — PHENYLEPHRINE HCL-NACL 20-0.9 MG/250ML-% IV SOLN: 0.0000 ug/min | INTRAVENOUS | Status: DC

## 2022-03-05 MED ORDER — CHLORHEXIDINE GLUCONATE 0.12 % MT SOLN
15.0000 mL | OROMUCOSAL | Status: AC
  Administered 2022-03-05: 15 mL via OROMUCOSAL

## 2022-03-05 MED ORDER — PROTAMINE SULFATE 10 MG/ML IV SOLN
INTRAVENOUS | Status: AC
  Filled 2022-03-05: qty 5

## 2022-03-05 MED ORDER — MIDAZOLAM HCL (PF) 10 MG/2ML IJ SOLN
INTRAMUSCULAR | Status: AC
  Filled 2022-03-05: qty 2

## 2022-03-05 MED ORDER — LACTATED RINGERS IV SOLN: 500.0000 mL | Freq: Once | INTRAVENOUS | Status: DC | PRN

## 2022-03-05 MED ORDER — PROPOFOL 10 MG/ML IV BOLUS
INTRAVENOUS | Status: DC | PRN
  Administered 2022-03-05: 30 mg via INTRAVENOUS
  Administered 2022-03-05: 130 mg via INTRAVENOUS

## 2022-03-05 MED ORDER — METOPROLOL TARTRATE 12.5 MG HALF TABLET
12.5000 mg | ORAL_TABLET | Freq: Two times a day (BID) | ORAL | Status: DC
  Administered 2022-03-06 (×2): 12.5 mg via ORAL
  Filled 2022-03-05 (×3): qty 1

## 2022-03-05 MED ORDER — BISACODYL 5 MG PO TBEC
10.0000 mg | DELAYED_RELEASE_TABLET | Freq: Every day | ORAL | Status: DC
  Administered 2022-03-06 – 2022-03-08 (×3): 10 mg via ORAL
  Filled 2022-03-05 (×3): qty 2

## 2022-03-05 MED ORDER — SODIUM CHLORIDE 0.9 % IV SOLN: 1.0000 g | Freq: Once | INTRAVENOUS | Status: DC

## 2022-03-05 MED ORDER — CHLORHEXIDINE GLUCONATE 0.12% ORAL RINSE (MEDLINE KIT)
15.0000 mL | Freq: Two times a day (BID) | OROMUCOSAL | Status: DC
  Administered 2022-03-05 – 2022-03-08 (×4): 15 mL via OROMUCOSAL

## 2022-03-05 MED ORDER — DEXMEDETOMIDINE HCL IN NACL 400 MCG/100ML IV SOLN
0.0000 ug/kg/h | INTRAVENOUS | Status: DC
  Administered 2022-03-05: 0.9 ug/kg/h via INTRAVENOUS
  Filled 2022-03-05: qty 100

## 2022-03-05 MED ORDER — HEPARIN SODIUM (PORCINE) 1000 UNIT/ML IJ SOLN
INTRAMUSCULAR | Status: DC | PRN
  Administered 2022-03-05: 29000 [IU] via INTRAVENOUS

## 2022-03-05 MED ORDER — 0.9 % SODIUM CHLORIDE (POUR BTL) OPTIME
TOPICAL | Status: DC | PRN
  Administered 2022-03-05: 5000 mL

## 2022-03-05 MED ORDER — MORPHINE SULFATE (PF) 2 MG/ML IV SOLN
1.0000 mg | INTRAVENOUS | Status: DC | PRN
  Administered 2022-03-05 – 2022-03-06 (×4): 2 mg via INTRAVENOUS
  Filled 2022-03-05 (×4): qty 1

## 2022-03-05 MED ORDER — NITROGLYCERIN IN D5W 200-5 MCG/ML-% IV SOLN: 0.0000 ug/min | INTRAVENOUS | Status: AC

## 2022-03-05 MED ORDER — PROPOFOL 10 MG/ML IV BOLUS
INTRAVENOUS | Status: AC
  Filled 2022-03-05: qty 20

## 2022-03-05 MED ORDER — ~~LOC~~ CARDIAC SURGERY, PATIENT & FAMILY EDUCATION
Freq: Once | Status: DC
  Filled 2022-03-05: qty 1

## 2022-03-05 MED ORDER — ALBUMIN HUMAN 5 % IV SOLN: INTRAVENOUS | Status: DC | PRN

## 2022-03-05 MED ORDER — MIDAZOLAM HCL 2 MG/2ML IJ SOLN
2.0000 mg | INTRAMUSCULAR | Status: DC | PRN
  Administered 2022-03-06: 2 mg via INTRAVENOUS
  Filled 2022-03-05: qty 2

## 2022-03-05 MED ORDER — LACTATED RINGERS IV SOLN
INTRAVENOUS | Status: DC | PRN
Start: 2022-03-05 — End: 2022-03-05

## 2022-03-05 MED ORDER — SODIUM CHLORIDE 0.45 % IV SOLN: INTRAVENOUS | Status: DC | PRN

## 2022-03-05 MED ORDER — METOPROLOL TARTRATE 12.5 MG HALF TABLET
12.5000 mg | ORAL_TABLET | Freq: Once | ORAL | Status: AC
  Administered 2022-03-05: 12.5 mg via ORAL
  Filled 2022-03-05: qty 1

## 2022-03-05 MED ORDER — POTASSIUM CHLORIDE 10 MEQ/50ML IV SOLN
10.0000 meq | INTRAVENOUS | Status: AC
  Filled 2022-03-05: qty 50

## 2022-03-05 MED ORDER — ROCURONIUM BROMIDE 10 MG/ML (PF) SYRINGE
PREFILLED_SYRINGE | INTRAVENOUS | Status: DC | PRN
  Administered 2022-03-05 (×2): 30 mg via INTRAVENOUS
  Administered 2022-03-05: 70 mg via INTRAVENOUS
  Administered 2022-03-05: 30 mg via INTRAVENOUS
  Administered 2022-03-05: 70 mg via INTRAVENOUS

## 2022-03-05 MED ORDER — CHLORHEXIDINE GLUCONATE 0.12 % MT SOLN
15.0000 mL | Freq: Once | OROMUCOSAL | Status: AC
  Administered 2022-03-05 – 2022-03-06 (×2): 15 mL via OROMUCOSAL
  Filled 2022-03-05: qty 15

## 2022-03-05 MED ORDER — SODIUM CHLORIDE (PF) 0.9 % IJ SOLN
INTRAMUSCULAR | Status: DC | PRN
  Administered 2022-03-05 (×4): 4 mL via TOPICAL

## 2022-03-05 MED ORDER — DEXTROSE 50 % IV SOLN: 0.0000 mL | INTRAVENOUS | Status: DC | PRN

## 2022-03-05 MED ORDER — PANTOPRAZOLE SODIUM 40 MG PO TBEC
40.0000 mg | DELAYED_RELEASE_TABLET | Freq: Every day | ORAL | Status: DC
  Administered 2022-03-07 – 2022-03-09 (×3): 40 mg via ORAL
  Filled 2022-03-05 (×3): qty 1

## 2022-03-05 MED ORDER — VANCOMYCIN HCL IN DEXTROSE 1-5 GM/200ML-% IV SOLN
1000.0000 mg | Freq: Once | INTRAVENOUS | Status: AC
  Administered 2022-03-05: 1000 mg via INTRAVENOUS
  Filled 2022-03-05: qty 200

## 2022-03-05 MED ORDER — EZETIMIBE 10 MG PO TABS
10.0000 mg | ORAL_TABLET | Freq: Every day | ORAL | Status: DC
  Administered 2022-03-06 – 2022-03-09 (×4): 10 mg via ORAL
  Filled 2022-03-05 (×4): qty 1

## 2022-03-05 MED ORDER — ACETAMINOPHEN 500 MG PO TABS
1000.0000 mg | ORAL_TABLET | Freq: Four times a day (QID) | ORAL | Status: DC
  Administered 2022-03-06 – 2022-03-09 (×10): 1000 mg via ORAL
  Filled 2022-03-05 (×11): qty 2

## 2022-03-05 MED ORDER — ORAL CARE MOUTH RINSE
15.0000 mL | OROMUCOSAL | Status: DC
  Administered 2022-03-05 – 2022-03-07 (×20): 15 mL via OROMUCOSAL

## 2022-03-05 MED ORDER — ALBUMIN HUMAN 5 % IV SOLN
250.0000 mL | INTRAVENOUS | Status: AC | PRN
  Administered 2022-03-05 (×4): 12.5 g via INTRAVENOUS
  Filled 2022-03-05: qty 250

## 2022-03-05 MED ORDER — SODIUM CHLORIDE 0.9 % IV SOLN
250.0000 mL | INTRAVENOUS | Status: DC
  Administered 2022-03-06: 250 mL via INTRAVENOUS

## 2022-03-05 MED ORDER — SODIUM CHLORIDE 0.9% FLUSH: 3.0000 mL | INTRAVENOUS | Status: DC | PRN

## 2022-03-05 MED ORDER — METOPROLOL TARTRATE 25 MG/10 ML ORAL SUSPENSION
12.5000 mg | Freq: Two times a day (BID) | ORAL | Status: DC
  Administered 2022-03-05: 12.5 mg
  Filled 2022-03-05: qty 10

## 2022-03-05 MED ORDER — FAMOTIDINE IN NACL 20-0.9 MG/50ML-% IV SOLN
20.0000 mg | Freq: Two times a day (BID) | INTRAVENOUS | Status: AC
  Administered 2022-03-05 (×2): 20 mg via INTRAVENOUS
  Filled 2022-03-05 (×2): qty 50

## 2022-03-05 MED ORDER — DOCUSATE SODIUM 100 MG PO CAPS
200.0000 mg | ORAL_CAPSULE | Freq: Every day | ORAL | Status: DC
  Administered 2022-03-06 – 2022-03-08 (×3): 200 mg via ORAL
  Filled 2022-03-05 (×3): qty 2

## 2022-03-05 MED ORDER — INSULIN REGULAR(HUMAN) IN NACL 100-0.9 UT/100ML-% IV SOLN: INTRAVENOUS | Status: DC

## 2022-03-05 MED ORDER — PLASMA-LYTE A IV SOLN: INTRAVENOUS | Status: DC | PRN

## 2022-03-05 MED ORDER — PROTAMINE SULFATE 10 MG/ML IV SOLN
INTRAVENOUS | Status: AC
  Filled 2022-03-05: qty 25

## 2022-03-05 MED ORDER — CALCIUM CHLORIDE 10 % IV SOLN
1.0000 g | Freq: Once | INTRAVENOUS | Status: AC
  Administered 2022-03-05: 1 g via INTRAVENOUS

## 2022-03-05 MED ORDER — ASPIRIN EC 325 MG PO TBEC
325.0000 mg | DELAYED_RELEASE_TABLET | Freq: Every day | ORAL | Status: DC
  Administered 2022-03-06 – 2022-03-09 (×4): 325 mg via ORAL
  Filled 2022-03-05 (×4): qty 1

## 2022-03-05 MED ORDER — PROTAMINE SULFATE 10 MG/ML IV SOLN
INTRAVENOUS | Status: DC | PRN
  Administered 2022-03-05: 130 mg via INTRAVENOUS
  Administered 2022-03-05: 120 mg via INTRAVENOUS
  Administered 2022-03-05: 20 mg via INTRAVENOUS

## 2022-03-05 MED ORDER — SODIUM CHLORIDE 0.9% IV SOLUTION
Freq: Once | INTRAVENOUS | Status: AC
  Administered 2022-03-05: 50 mL via INTRAVENOUS

## 2022-03-05 MED ORDER — ASPIRIN 81 MG PO CHEW: 324.0000 mg | CHEWABLE_TABLET | Freq: Every day | ORAL | Status: DC

## 2022-03-05 MED ORDER — ACETAMINOPHEN 160 MG/5ML PO SOLN: 1000.0000 mg | Freq: Four times a day (QID) | ORAL | Status: DC

## 2022-03-05 MED ORDER — CHLORHEXIDINE GLUCONATE 4 % EX LIQD: 30.0000 mL | CUTANEOUS | Status: DC

## 2022-03-05 MED ORDER — HEPARIN SODIUM (PORCINE) 1000 UNIT/ML IJ SOLN
INTRAMUSCULAR | Status: AC
  Filled 2022-03-05: qty 1

## 2022-03-05 MED ORDER — TRAMADOL HCL 50 MG PO TABS: 50.0000 mg | ORAL_TABLET | ORAL | Status: DC | PRN

## 2022-03-05 MED ORDER — BISACODYL 10 MG RE SUPP: 10.0000 mg | Freq: Every day | RECTAL | Status: DC

## 2022-03-05 MED ORDER — SODIUM CHLORIDE 0.9 % IV SOLN: INTRAVENOUS | Status: DC | PRN

## 2022-03-05 MED ORDER — FENTANYL CITRATE (PF) 250 MCG/5ML IJ SOLN
INTRAMUSCULAR | Status: DC | PRN
  Administered 2022-03-05: 250 ug via INTRAVENOUS
  Administered 2022-03-05: 25 ug via INTRAVENOUS
  Administered 2022-03-05: 150 ug via INTRAVENOUS
  Administered 2022-03-05 (×2): 250 ug via INTRAVENOUS
  Administered 2022-03-05 (×2): 50 ug via INTRAVENOUS
  Administered 2022-03-05: 100 ug via INTRAVENOUS
  Administered 2022-03-05: 225 ug via INTRAVENOUS

## 2022-03-05 MED ORDER — LEVOTHYROXINE SODIUM 100 MCG PO TABS
100.0000 ug | ORAL_TABLET | Freq: Every day | ORAL | Status: DC
  Administered 2022-03-06 – 2022-03-09 (×4): 100 ug via ORAL
  Filled 2022-03-05 (×4): qty 1

## 2022-03-05 MED ORDER — ACETAMINOPHEN 650 MG RE SUPP
650.0000 mg | Freq: Once | RECTAL | Status: AC
  Administered 2022-03-05: 650 mg via RECTAL

## 2022-03-05 MED ORDER — SODIUM CHLORIDE 0.9% FLUSH
3.0000 mL | Freq: Two times a day (BID) | INTRAVENOUS | Status: DC
  Administered 2022-03-06 – 2022-03-07 (×4): 3 mL via INTRAVENOUS

## 2022-03-05 MED ORDER — MIDAZOLAM HCL (PF) 5 MG/ML IJ SOLN
INTRAMUSCULAR | Status: DC | PRN
  Administered 2022-03-05 (×2): 2 mg via INTRAVENOUS
  Administered 2022-03-05: 1 mg via INTRAVENOUS

## 2022-03-05 MED ORDER — METOPROLOL TARTRATE 5 MG/5ML IV SOLN: 2.5000 mg | INTRAVENOUS | Status: DC | PRN

## 2022-03-05 MED ORDER — ACETAMINOPHEN 160 MG/5ML PO SOLN: 650.0000 mg | Freq: Once | ORAL | Status: AC

## 2022-03-05 MED ORDER — SODIUM CHLORIDE 0.9 % IV SOLN: INTRAVENOUS | Status: DC

## 2022-03-05 MED ORDER — ROSUVASTATIN CALCIUM 5 MG PO TABS
10.0000 mg | ORAL_TABLET | Freq: Every morning | ORAL | Status: DC
  Administered 2022-03-07 – 2022-03-09 (×3): 10 mg via ORAL
  Filled 2022-03-05 (×4): qty 2

## 2022-03-05 MED FILL — Magnesium Sulfate Inj 50%: INTRAMUSCULAR | Qty: 10 | Status: AC

## 2022-03-05 MED FILL — Heparin Sodium (Porcine) Inj 1000 Unit/ML: Qty: 1000 | Status: AC

## 2022-03-05 MED FILL — Potassium Chloride Inj 2 mEq/ML: INTRAVENOUS | Qty: 40 | Status: AC

## 2022-03-05 SURGICAL SUPPLY — 95 items
BAG DECANTER FOR FLEXI CONT (MISCELLANEOUS) ×5 IMPLANT
BLADE CLIPPER SURG (BLADE) ×5 IMPLANT
BLADE CORE FAN STRYKER (BLADE) ×1 IMPLANT
BLADE SURG 10 STRL SS (BLADE) ×1 IMPLANT
BLADE SURG 15 STRL LF DISP TIS (BLADE) IMPLANT
BLADE SURG 15 STRL SS (BLADE) ×1
BNDG ELASTIC 4X5.8 VLCR STR LF (GAUZE/BANDAGES/DRESSINGS) ×1 IMPLANT
BNDG ELASTIC 6X5.8 VLCR STR LF (GAUZE/BANDAGES/DRESSINGS) ×1 IMPLANT
BNDG GAUZE ELAST 4 BULKY (GAUZE/BANDAGES/DRESSINGS) ×1 IMPLANT
CANISTER SUCT 3000ML PPV (MISCELLANEOUS) ×5 IMPLANT
CANNULA EZ GLIDE AORTIC 21FR (CANNULA) ×5 IMPLANT
CATH CPB KIT HENDRICKSON (MISCELLANEOUS) ×5 IMPLANT
CATH ROBINSON RED A/P 18FR (CATHETERS) ×6 IMPLANT
CATH THORACIC 36FR (CATHETERS) ×5 IMPLANT
CATH THORACIC 36FR RT ANG (CATHETERS) ×5 IMPLANT
CLIP TI MEDIUM 24 (CLIP) ×1 IMPLANT
CLIP TI WIDE RED SMALL 24 (CLIP) ×1 IMPLANT
CONTAINER PROTECT SURGISLUSH (MISCELLANEOUS) ×10 IMPLANT
COVER MAYO STAND STRL (DRAPES) ×1 IMPLANT
COVER PROBE W GEL 5X96 (DRAPES) ×1 IMPLANT
DERMABOND ADVANCED (GAUZE/BANDAGES/DRESSINGS) ×1
DERMABOND ADVANCED .7 DNX12 (GAUZE/BANDAGES/DRESSINGS) IMPLANT
DRAIN CHANNEL 28F RND 3/8 FF (WOUND CARE) ×1 IMPLANT
DRAPE CARDIOVASCULAR INCISE (DRAPES) ×1
DRAPE EXTREMITY T 121X128X90 (DISPOSABLE) ×5 IMPLANT
DRAPE HALF SHEET 40X57 (DRAPES) ×1 IMPLANT
DRAPE SRG 135X102X78XABS (DRAPES) ×4 IMPLANT
DRAPE WARM FLUID 44X44 (DRAPES) ×5 IMPLANT
DRSG COVADERM 4X14 (GAUZE/BANDAGES/DRESSINGS) ×1 IMPLANT
ELECT REM PT RETURN 9FT ADLT (ELECTROSURGICAL) ×5
ELECTRODE REM PT RTRN 9FT ADLT (ELECTROSURGICAL) ×8 IMPLANT
FELT TEFLON 1X6 (MISCELLANEOUS) ×9 IMPLANT
FEMORAL VENOUS CANN RAP (CANNULA) ×1 IMPLANT
GAUZE SPONGE 4X4 12PLY STRL (GAUZE/BANDAGES/DRESSINGS) ×2 IMPLANT
GEL ULTRASOUND 20GR AQUASONIC (MISCELLANEOUS) ×1 IMPLANT
GLOVE SURG SIGNA 7.5 PF LTX (GLOVE) ×15 IMPLANT
GOWN STRL REUS W/ TWL LRG LVL3 (GOWN DISPOSABLE) ×16 IMPLANT
GOWN STRL REUS W/ TWL XL LVL3 (GOWN DISPOSABLE) ×8 IMPLANT
GOWN STRL REUS W/TWL LRG LVL3 (GOWN DISPOSABLE) ×5
GOWN STRL REUS W/TWL XL LVL3 (GOWN DISPOSABLE) ×4
HEMOSTAT POWDER SURGIFOAM 1G (HEMOSTASIS) ×16 IMPLANT
HEMOSTAT SURGICEL 2X14 (HEMOSTASIS) ×5 IMPLANT
INSERT FOGARTY XLG (MISCELLANEOUS) ×1 IMPLANT
IV CATH 18G X1.75 CATHLON (IV SOLUTION) ×1 IMPLANT
KIT BASIN OR (CUSTOM PROCEDURE TRAY) ×5 IMPLANT
KIT CATH SUCT 8FR (CATHETERS) ×1 IMPLANT
KIT DILATOR VASC 18G NDL (KITS) ×1 IMPLANT
KIT DRAINAGE VACCUM ASSIST (KITS) ×1 IMPLANT
KIT SUCTION CATH 14FR (SUCTIONS) ×10 IMPLANT
KIT TURNOVER KIT B (KITS) ×5 IMPLANT
KIT VASOVIEW HEMOPRO 2 VH 4000 (KITS) ×5 IMPLANT
LOOP VESSEL SUPERMAXI WHITE (MISCELLANEOUS) ×1 IMPLANT
MARKER GRAFT CORONARY BYPASS (MISCELLANEOUS) ×15 IMPLANT
NS IRRIG 1000ML POUR BTL (IV SOLUTION) ×25 IMPLANT
PACK E OPEN HEART (SUTURE) ×5 IMPLANT
PACK OPEN HEART (CUSTOM PROCEDURE TRAY) ×5 IMPLANT
PAD ARMBOARD 7.5X6 YLW CONV (MISCELLANEOUS) ×10 IMPLANT
PAD ELECT DEFIB RADIOL ZOLL (MISCELLANEOUS) ×5 IMPLANT
PENCIL BUTTON HOLSTER BLD 10FT (ELECTRODE) ×5 IMPLANT
POSITIONER HEAD DONUT 9IN (MISCELLANEOUS) ×5 IMPLANT
PUNCH AORTIC ROTATE 4.5MM 8IN (MISCELLANEOUS) ×1 IMPLANT
SET MPS 3-ND DEL (MISCELLANEOUS) ×1 IMPLANT
SHEARS HARMONIC 9CM CVD (BLADE) ×5 IMPLANT
SPONGE T-LAP 18X18 ~~LOC~~+RFID (SPONGE) ×20 IMPLANT
SPONGE T-LAP 4X18 ~~LOC~~+RFID (SPONGE) ×5 IMPLANT
SUPPORT HEART JANKE-BARRON (MISCELLANEOUS) ×5 IMPLANT
SUT BONE WAX W31G (SUTURE) ×5 IMPLANT
SUT PROLENE 3 0 SH DA (SUTURE) ×5 IMPLANT
SUT PROLENE 4 0 RB 1 (SUTURE) ×3
SUT PROLENE 4-0 RB1 .5 CRCL 36 (SUTURE) IMPLANT
SUT PROLENE 6 0 C 1 30 (SUTURE) ×11 IMPLANT
SUT PROLENE 7 0 BV 1 (SUTURE) ×5 IMPLANT
SUT PROLENE 7 0 BV1 MDA (SUTURE) ×5 IMPLANT
SUT PROLENE 8 0 BV175 6 (SUTURE) ×2 IMPLANT
SUT SILK  1 MH (SUTURE) ×1
SUT SILK 1 MH (SUTURE) IMPLANT
SUT STEEL 6MS V (SUTURE) ×5 IMPLANT
SUT STEEL STERNAL CCS#1 18IN (SUTURE) ×1 IMPLANT
SUT STEEL SZ 6 DBL 3X14 BALL (SUTURE) ×5 IMPLANT
SUT VIC AB 1 CTX 36 (SUTURE) ×2
SUT VIC AB 1 CTX36XBRD ANBCTR (SUTURE) ×8 IMPLANT
SUT VIC AB 2-0 CT1 27 (SUTURE) ×6
SUT VIC AB 2-0 CT1 TAPERPNT 27 (SUTURE) IMPLANT
SUT VIC AB 3-0 SH 27 (SUTURE) ×1
SUT VIC AB 3-0 SH 27X BRD (SUTURE) IMPLANT
SUT VIC AB 3-0 X1 27 (SUTURE) ×8 IMPLANT
SYSTEM SAHARA CHEST DRAIN ATS (WOUND CARE) ×5 IMPLANT
TAPE CLOTH SURG 4X10 WHT LF (GAUZE/BANDAGES/DRESSINGS) ×2 IMPLANT
TAPE PAPER 2X10 WHT MICROPORE (GAUZE/BANDAGES/DRESSINGS) ×1 IMPLANT
TOWEL GREEN STERILE (TOWEL DISPOSABLE) ×5 IMPLANT
TOWEL GREEN STERILE FF (TOWEL DISPOSABLE) ×5 IMPLANT
TRAY FOLEY SLVR 16FR TEMP STAT (SET/KITS/TRAYS/PACK) ×5 IMPLANT
TUBING LAP HI FLOW INSUFFLATIO (TUBING) ×5 IMPLANT
UNDERPAD 30X36 HEAVY ABSORB (UNDERPADS AND DIAPERS) ×5 IMPLANT
WATER STERILE IRR 1000ML POUR (IV SOLUTION) ×10 IMPLANT

## 2022-03-05 NOTE — Anesthesia Procedure Notes (Signed)
Central Venous Catheter Insertion ?Performed by: Suzette Battiest, MD, anesthesiologist ?Start/End4/27/2023 7:25 AM, 03/05/2022 7:40 AM ?Patient location: Pre-op. ?Preanesthetic checklist: patient identified, IV checked, site marked, risks and benefits discussed, surgical consent, monitors and equipment checked, pre-op evaluation, timeout performed and anesthesia consent ?Position: Trendelenburg ?Lidocaine 1% used for infiltration and patient sedated ?Hand hygiene performed , maximum sterile barriers used  and Seldinger technique used ?Catheter size: 8.5 Fr ?Total catheter length 10. ?Central line was placed.MAC introducer ?Swan type:thermodilution ?Procedure performed using ultrasound guided technique. ?Ultrasound Notes:anatomy identified, needle tip was noted to be adjacent to the nerve/plexus identified, no ultrasound evidence of intravascular and/or intraneural injection and image(s) printed for medical record ?Attempts: 1 ?Following insertion, line sutured and dressing applied. ?Post procedure assessment: blood return through all ports, free fluid flow and no air ? ?Patient tolerated the procedure with difficulty. ?Additional procedure comments: Needle insertion under u/s guidance with aspiration of blood and catheter threaded easily over needle. Wire inserted through catheter and catheter removed. Wire freely mobile and visible in IJ on U/S. Introducer advanced with unexpected resistance. Upon attempting removal of the wire and introducer/ dilator, wire not coming back freely despite catheter all the way in. Introducer and wire removed together but wire sheared in process, but tip intact. Chest xray performed to rule out retained wire and none noted but catheter folded on itself in the IJ. Catheter removed without complication in OR after left sided line placed. ? ?R. Ola Spurr, MD. ? ? ? ? ? ?

## 2022-03-05 NOTE — Transfer of Care (Signed)
Immediate Anesthesia Transfer of Care Note ? ?Patient: Miguel Vasquez ? ?Procedure(s) Performed: REDO CORONARY ARTERY BYPASS GRAFTING (CABG) X 3 WITH HARVESTED LEFT GREATER SAPHENOUS VEIN AND RIGHT RADIAL ARTERY (Chest) ?TRANSESOPHAGEAL ECHOCARDIOGRAM (TEE) ?RADIAL ARTERY HARVEST (Right: Arm Lower) ?ENDOVEIN HARVEST OF GREATER SAPHENOUS VEIN (Left) ? ?Patient Location: ICU ? ?Anesthesia Type:General ? ?Level of Consciousness: sedated and Patient remains intubated per anesthesia plan ? ?Airway & Oxygen Therapy: Patient remains intubated per anesthesia plan and Patient placed on Ventilator (see vital sign flow sheet for setting) ? ?Post-op Assessment: Report given to RN and Post -op Vital signs reviewed and stable ? ?Post vital signs: Reviewed and stable ? ?Last Vitals:  ?Vitals Value Taken Time  ?BP 128/92 03/05/22 1615  ?Temp 36.1 ?C 03/05/22 1616  ?Pulse 80 03/05/22 1616  ?Resp 12 03/05/22 1616  ?SpO2 100 % 03/05/22 1616  ?Vitals shown include unvalidated device data. ? ?Last Pain:  ?Vitals:  ? 03/05/22 0706  ?TempSrc:   ?PainSc: 0-No pain  ?   ? ?  ? ?Complications: No notable events documented. ?

## 2022-03-05 NOTE — Interval H&P Note (Signed)
History and Physical Interval Note: ? ?03/05/2022 ?9:09 AM ? ?Miguel Vasquez  has presented today for surgery, with the diagnosis of 3V CAD.  The various methods of treatment have been discussed with the patient and family. After consideration of risks, benefits and other options for treatment, the patient has consented to  Procedure(s): ?REDO CORONARY ARTERY BYPASS GRAFTING (CABG) WITH ENDOVEIN HARVEST OF LEFT LEG (N/A) ?TRANSESOPHAGEAL ECHOCARDIOGRAM (TEE) (N/A) ?RADIAL ARTERY HARVEST (Right) as a surgical intervention.  The patient's history has been reviewed, patient examined, no change in status, stable for surgery.  I have reviewed the patient's chart and labs.  Questions were answered to the patient's satisfaction.   ? ? ?Melrose Nakayama ? ? ?

## 2022-03-05 NOTE — Progress Notes (Signed)
?  Echocardiogram ?Echocardiogram Transesophageal has been performed. ? Miguel Vasquez ?03/05/2022, 9:31 AM ?

## 2022-03-05 NOTE — Anesthesia Procedure Notes (Signed)
Central Venous Catheter Insertion ?Performed by: Suzette Battiest, MD, anesthesiologist ?Start/End4/27/2023 8:50 AM, 03/05/2022 9:00 AM ?Patient location: Pre-op. ?Preanesthetic checklist: patient identified, IV checked, site marked, risks and benefits discussed, surgical consent, monitors and equipment checked, pre-op evaluation, timeout performed and anesthesia consent ?Position: Trendelenburg ?Lidocaine 1% used for infiltration and patient sedated ?Hand hygiene performed , maximum sterile barriers used  and Seldinger technique used ?Catheter size: 9 Fr ?Total catheter length 10. ?Central line and PA cath was placed.MAC introducer ?Swan type:thermodilution ?PA Cath depth:50 ?Procedure performed using ultrasound guided technique. ?Ultrasound Notes:anatomy identified, needle tip was noted to be adjacent to the nerve/plexus identified, no ultrasound evidence of intravascular and/or intraneural injection and image(s) printed for medical record ?Attempts: 1 ?Following insertion, line sutured, dressing applied and Biopatch. ?Post procedure assessment: blood return through all ports, free fluid flow and no air ? ?Patient tolerated the procedure well with no immediate complications. ? ? ? ? ?

## 2022-03-05 NOTE — Anesthesia Procedure Notes (Signed)
Procedure Name: Intubation ?Date/Time: 03/05/2022 8:41 AM ?Performed by: Valda Favia, CRNA ?Pre-anesthesia Checklist: Patient identified, Emergency Drugs available, Suction available and Patient being monitored ?Patient Re-evaluated:Patient Re-evaluated prior to induction ?Oxygen Delivery Method: Circle System Utilized ?Preoxygenation: Pre-oxygenation with 100% oxygen ?Induction Type: IV induction ?Ventilation: Mask ventilation without difficulty and Oral airway inserted - appropriate to patient size ?Laryngoscope Size: Mac and 4 ?Grade View: Grade II ?Tube type: Oral ?Tube size: 8.0 mm ?Number of attempts: 1 ?Airway Equipment and Method: Stylet and Oral airway ?Placement Confirmation: ETT inserted through vocal cords under direct vision, positive ETCO2 and breath sounds checked- equal and bilateral ?Secured at: 23 cm ?Tube secured with: Tape ?Dental Injury: Teeth and Oropharynx as per pre-operative assessment  ? ? ? ? ?

## 2022-03-05 NOTE — Anesthesia Procedure Notes (Signed)
Central Venous Catheter Insertion ?Performed by: Suzette Battiest, MD, anesthesiologist ?Start/End4/27/2023 8:50 AM, 03/05/2022 9:00 AM ?Patient location: Pre-op. ?Preanesthetic checklist: patient identified, IV checked, site marked, risks and benefits discussed, surgical consent, monitors and equipment checked, pre-op evaluation, timeout performed and anesthesia consent ?Hand hygiene performed  and maximum sterile barriers used  ?PA cath was placed.Swan type:thermodilution ?PA Cath depth:50 ?Procedure performed without using ultrasound guided technique. ?Attempts: 1 ?Patient tolerated the procedure well with no immediate complications. ? ? ? ?

## 2022-03-05 NOTE — Procedures (Signed)
Extubation Procedure Note ? ?Patient Details:   ?Name: Miguel Vasquez ?DOB: 1946/04/20 ?MRN: [id number] ?  ?Airway Documentation:  ?Airway 8 mm (Active)  ?Secured at (cm) 23 cm 03/05/22 2006  ?Measured From Lips 03/05/22 2006  ?Secured Location Right 03/05/22 2006  ?Secured By [employer] 03/05/22 2006  ?Tube Holder Repositioned Yes 03/05/22 1559  ?Prone position No 03/05/22 2006  ?Cuff Pressure (cm H2O) Green OR 18-26 [facility] 03/05/22 2006  ?Site Condition Dry 03/05/22 2006  ? ?Vent end date: (not recorded) Vent end time: (not recorded)  ? ?Evaluation ? O2 sats: stable throughout ?Complications: No apparent complications ?Patient did tolerate procedure well. ?Bilateral Breath Sounds: Clear, Diminished ?  ?Yes ?Patient had a NIF of -28 and VC of 1.0, ?Extubated patient to 4L nasal canula. Patient oriented to time and place, ? ?[name] ?03/05/2022, 10:50 PM ? ?

## 2022-03-05 NOTE — Anesthesia Preprocedure Evaluation (Addendum)
Anesthesia Evaluation  ?Patient identified by MRN, date of birth, ID band ?Patient awake ? ? ? ?Reviewed: ?Allergy & Precautions, NPO status , Patient's Chart, lab work & pertinent test results ? ?Airway ?Mallampati: II ? ?TM Distance: >3 FB ?Neck ROM: Full ? ? ? Dental ? ?(+) Dental Advisory Given ?  ?Pulmonary ?former smoker,  ?  ?breath sounds clear to auscultation ? ? ? ? ? ? Cardiovascular ?hypertension, Pt. on medications ?+ angina + CAD, + Past MI and + CABG  ? ?Rhythm:Regular Rate:Normal ? ? ?  ?Neuro/Psych ?negative neurological ROS ?   ? GI/Hepatic ?Neg liver ROS, GERD  ,  ?Endo/Other  ?diabetes, Type 2Hypothyroidism  ? Renal/GU ?negative Renal ROS  ? ?  ?Musculoskeletal ? ? Abdominal ?  ?Peds ? Hematology ?negative hematology ROS ?(+)   ?Anesthesia Other Findings ? ? Reproductive/Obstetrics ? ?  ? ? ? ? ? ? ? ? ? ? ? ? ? ?  ?  ? ? ? ? ? ? ? ? ?Anesthesia Physical ?Anesthesia Plan ? ?ASA: 4 ? ?Anesthesia Plan: General  ? ?Post-op Pain Management: Tylenol PO (pre-op)*  ? ?Induction: Intravenous ? ?PONV Risk Score and Plan: 2 and Dexamethasone, Ondansetron and Treatment may vary due to age or medical condition ? ?Airway Management Planned: Oral ETT ? ?Additional Equipment: Arterial line, CVP, PA Cath, TEE and Ultrasound Guidance Line Placement ? ?Intra-op Plan:  ? ?Post-operative Plan: Post-operative intubation/ventilation ? ?Informed Consent: I have reviewed the patients History and Physical, chart, labs and discussed the procedure including the risks, benefits and alternatives for the proposed anesthesia with the patient or authorized representative who has indicated his/her understanding and acceptance.  ? ? ? ?Dental advisory given ? ?Plan Discussed with: CRNA ? ?Anesthesia Plan Comments:   ? ? ? ? ? ? ?Anesthesia Quick Evaluation ? ?

## 2022-03-05 NOTE — OR Nursing (Signed)
Family given update on surgery at Dr. Leonarda Salon request. ?

## 2022-03-05 NOTE — Progress Notes (Signed)
? ?   ?  [address] ?      [place] [address] ?            [phone]   ? ?  ?S/p redo CABG ? ?Intubated, sedated ? ?BP 99/71   Pulse 80   Temp 98.1 ?F (36.7 ?C) (Core)   Resp 12   Ht '6\' 1"'$  (1.854 m)   Wt 96.2 kg   SpO2 100%   BMI 27.97 kg/m?  ?15/5 CI 1.3 ?NTG @ 5 ?Neo @ 15 ? ? ?Intake/Output Summary (Last 24 hours) at 03/05/2022 1828 ?Last data filed at 03/05/2022 1614 ?Gross per 24 hour  ?Intake 5751 ml  ?Output 3433 ml  ?Net 2318 ml  ?Moderate CT output ~350 ml in 2 hours ? ?Hct 30, PLT 84K, PT 17.6, PTT 32 ?TEG indicated to give platelets and cryo, FFP given earlier ? ?S/p Redo CABG ?Correct coagulopathy, thrombocytopenia ?Volume resuscitation- C low in setting of low filling pressures ? ?Revonda Standard Roxan Hockey, MD ?[facility] ?[phone] ? ?

## 2022-03-05 NOTE — Brief Op Note (Signed)
03/05/2022 ? ?6:39 PM ? ?PATIENT:  Miguel Vasquez  76 y.o. male ? ?PRE-OPERATIVE DIAGNOSIS:  THREE VESSEL CORONARY ARTERY DISEASE ? ?POST-OPERATIVE DIAGNOSIS:  THREE VESSEL CORONARY ARTERY DISEASE ? ?PROCEDURE:  Procedure(s): ?REDO CORONARY ARTERY BYPASS GRAFTING (CABG) X 3 WITH HARVESTED LEFT GREATER SAPHENOUS VEIN AND RIGHT RADIAL ARTERY (N/A) ?TRANSESOPHAGEAL ECHOCARDIOGRAM (TEE) (N/A) ?RADIAL ARTERY HARVEST (Right) ?ENDOVEIN HARVEST OF GREATER SAPHENOUS VEIN (Left) ?Open Vein harvest time: 19mn Vein prep time: 163m ?RIGHT RADIAL -LAD ?SVG-PL ?SVG-OM ? ?SURGEON:  Surgeon(s) and Role: ?   * HeMelrose NakayamaMD - Primary ? ?PHYSICIAN ASSISTANT: WAYNE GOLD PA-C ? ?ASSISTANTS: STAFF  ? ?ANESTHESIA:   general ? ?EBL:  508 mL  ? ?BLOOD ADMINISTERED: 1 unit CC PRBC and 2 units FFP ? ?DRAINS:  2 Chest Tube(s) in the mediastinum   ? ?LOCAL MEDICATIONS USED:  NONE ? ?SPECIMEN:  No Specimen ? ?DISPOSITION OF SPECIMEN:  N/A ? ?COUNTS:  YES ? ?TOURNIQUET:  * No tourniquets in log * ? ?DICTATION: .Other Dictation: Dictation Number PENDING ? ?PLAN OF CARE: Admit to inpatient  ? ?PATIENT DISPOSITION:  ICU - intubated and hemodynamically stable. ?  ?Delay start of Pharmacological VTE agent (>24hrs) due to surgical blood loss or risk of bleeding: yes ? ?

## 2022-03-05 NOTE — Progress Notes (Signed)
CXR result was communicated to Dr. Deatra Canter ?

## 2022-03-05 NOTE — Plan of Care (Signed)

## 2022-03-05 NOTE — Hospital Course (Addendum)
?  HPI: Mr. Brannigan was sent for consideration for redo bypass grafting for three-vessel coronary disease. ?  ?Miguel Vasquez is a 76 year old man with a past medical history significant for coronary artery disease, previous stents and rotational atherectomy, coronary bypass grafting, hypertension, hyperlipidemia, type 2 diabetes, mild aortic insufficiency, panic disorder, hypothyroidism, gout, and thrombocytopenia.  He had coronary bypass grafting x3 in 2015.  He was noted to have good targets and good conduits.  His vein graft to the OM failed relatively early likely due to competitive flow.  Later had the vein graft to the posterior lateral became occluded. ? ?Recently, he noted some shortness of breath and vague tightness in his chest with lifting some heavy bags in his yard.  He saw Dr. Martinique.  He underwent cardiac catheterization on 02/06/2022.  It revealed all 3 grafts were occluded.  He had severe native three-vessel coronary disease.  Not favorable for percutaneous intervention.  Preserved left ventricular function. ? ?Dr. Roxan Hockey evaluated the patient and all relevant studies and recommended proceeding with redo CABG.  He was admitted this hospitalization for the procedure. ? ?Hospital course: ? ?The patient was admitted electively and taken the operating room on 03/05/2022 at which time he underwent redo CABG x3 he tolerated the procedure well was taken to the surgical intensive care unit in stable condition.  The patient was extubated the evening of surgery.  He was started on Imdur for his radial artery graft.  He was started on diuretics for volume overloaded state.  His chest tube and arterial lines were removed without difficulty.  He had post operative thrombocytopenia and was not started on Lovenox.  Unfortunately this platelets continued to drop.  HIT panel was obtained and showed ***.  He was maintaining NSR and was stable for transfer to the floor on 03/07/2022.  His home diabetic medications were  resumed.  He was tachycardic at times and his Lopressor was titrated as tolerated.   ?  ?

## 2022-03-05 NOTE — Anesthesia Procedure Notes (Signed)
Arterial Line Insertion Performed by: Kenzel Ruesch B, CRNA, CRNA  Patient location: Pre-op. Preanesthetic checklist: patient identified, IV checked, site marked, risks and benefits discussed, surgical consent, monitors and equipment checked, pre-op evaluation, timeout performed and anesthesia consent Lidocaine 1% used for infiltration and patient sedated Left, radial was placed Catheter size: 20 G Hand hygiene performed , maximum sterile barriers used  and Seldinger technique used Allen's test indicative of satisfactory collateral circulation Attempts: 1 Procedure performed without using ultrasound guided technique. Following insertion, dressing applied and Biopatch. Post procedure assessment: normal  Patient tolerated the procedure well with no immediate complications.    

## 2022-03-05 NOTE — Op Note (Signed)
NAME: Miguel Vasquez, Miguel Vasquez ?MEDICAL RECORD NO: [id number] ?ACCOUNT NO: [id number] ?DATE OF BIRTH: 06/28/1946 ?FACILITY: [facility] ?LOCATION: [facility] ?PHYSICIAN: Revonda Standard. Roxan Hockey, MD ? ?Operative Report  ? ?DATE OF PROCEDURE: 03/05/2022 ? ?PREPROCEDURE DIAGNOSIS:  Severe 3-vessel coronary disease with occluded bypass grafts. ? ?POSTOPERATIVE DIAGNOSIS:  Severe 3-vessel coronary disease with occluded bypass grafts. ? ?PROCEDURE:   ?Redo median sternotomy, extracorporeal circulation,  ?Redo coronary artery bypass grafting x 3 ?Right radial artery to LAD,  ?Saphenous vein graft to obtuse marginal,  ?Saphenous vein graft to posterolateral ?Open vein harvest of left leg,  ?Open right radial artery harvest. ? ?SURGEON:  Revonda Standard. Roxan Hockey, MD ? ?ASSISTANT:  Jadene Pierini, PA ? ?ANESTHESIA:  General. ? ?FINDINGS:  Severe intrapericardial adhesions. Mammary and saphenous veins sclerotic. Fair quality targets. Severe aortic calcification. ? ?CLINICAL NOTE:  [name] is a 76 year old gentleman with history of coronary disease and multiple previous coronary interventions including coronary artery bypass grafting in 2015. He presented with shortness of breath and chest tightness and was  ?found to have severe native 3-vessel disease with all three previous grafts occluded.  He was referred for redo coronary artery bypass grafting.  The indications, risks, benefits, and alternatives were discussed in detail with the patient.  He understood ? and accepted the risks and agreed to proceed. ? ?OPERATIVE NOTE:  Mr. Soja was brought to the preoperative holding area on 03/05/2022. An arterial blood pressure monitoring line was placed. A right internal jugular catheter was placed, but was kinked.  The patient was taken to the operating room and  ?anesthetized and intubated.  The right internal jugular catheter was removed.  A left internal jugular catheter was placed and a Swan-Ganz was placed by Dr. Ola Spurr.  Intravenous antibiotics were  administered.  The chest, abdomen, legs and right arm  ?were prepped and draped in the usual fashion.  Transesophageal echocardiography was performed by Dr. Ola Spurr.  It showed preserved left ventricular function with no significant valvular pathology. ? ?The saphenous vein and radial artery were harvested simultaneously. An incision was made over the volar aspect of the right wrist.  It was carried through the skin and subcutaneous tissue.  Hemostasis was achieved.  A short segment of the radial artery  ?was dissected out and there was a good pulse distally with proximal occlusion consistent with the findings of the preoperative vascular studies. The incision was extended to just below the antecubital fossa and the right radial artery was harvested using ? the Harmonic scalpel.  It was a large caliber, good quality vessel.  Simultaneously, Jadene Pierini made an incision in the left leg just below the knee.  Small vein branches could be identified, but no definitive saphenous vein was identified.  Ultimately,  ?the saphenous vein was harvested from the left thigh using bridged open incisions.  The saphenous vein was of good quality.  After dissecting out the radial artery, the distal end was divided.  There was excellent flow through the vessel.  A bulldog  ?clamp was placed on the cut end of the vessel.  There was good backbleeding from the radial at the wrist.  It was suture ligated with a 4-0 Prolene suture.  The radial also was suture ligated proximally with a 4-0 Prolene suture.  The radial and saphenous vein  ?were placed in a heparinized saline and papaverine solution.  The right arm incision was closed in standard fashion, it then was wrapped and tucked to the patient's side.  A redo median sternotomy was  performed.  An incision was made, it carried through the ? skin and subcutaneous tissue.  The wires were cut and removed. All the wires were removed intact.  The sternum was divided with an oscillating saw.   Each side of the sternum was elevated and the underlying tissue was dissected off with cautery.  Sternal ? retractor was placed and it was gently opened over time. With initial opening of the retractor, there were some adhesions to the right ventricle, which were taken down with cautery.  The pericardium had been closed over the base of the heart and it was  ?incised and pericardial stay sutures were placed.  The vein grafts were noted to be sclerotic with no evidence of atherosclerotic disease.  The chest was relatively deep and the right atrium was difficult to access.  It was felt safest to perform  ?femoral venous cannulation.  The vein was accessed using a Seldinger technique.  The wire passed into the right atrium.  The tract over the wire was sequentially dilated and then a 23/25-French venous cannula was inserted and ultimately the tip was  ?directed into the superior vena cava.  It was connected to the bypass circuit.  The aorta was cannulated via concentric 2-0 Ethibond pledgeted pursestring sutures.  After confirming adequate anticoagulation with ACT measurement, cardiopulmonary  ?bypass was initiated.  Flows were maintained per protocol.  The patient was cooled to 34 degrees Celsius.  The remainder of the pericardial dissection was carried out.  There were minimal adhesions posteriorly.  The coronaries were identifiable, but only ? by the sites where the vein grafts were inserted and they were dissected out once the aorta was crossclamped.  The conduits were inspected and prepared.  Foam pad was placed in the pericardium to insulate the heart.  A temperature probe was placed in  ?the myocardial septum.  A retrograde cardioplegia cannula was placed via pursed suture in the right atrium and directed into the coronary sinus.  An antegrade cardioplegia cannula was placed in the ascending aorta.  Of note, the ascending aorta had  ?severe calcification, but there was adequate space on the aorta to allow  placement of the crossclamp. ? ?The aorta was crossclamped.  The left ventricle was emptied via the aortic root vent.  Cardiac arrest then was achieved with a combination of cold antegrade and retrograde blood cardioplegia and topical iced saline.  Initial 500 mL of cardioplegia was  ?given antegrade.  There was a rapid diastolic arrest.  973 mL of cardioplegia then was given retrograde.  Initially, there was a good pressure rise and rapid septal temperature drop, but the retrograde catheter became dislodged near the completion of the ? 500 mL of cardioplegia and an additional 500 mL was given antegrade as the septal cooling was still only about 16 degrees Celsius.  At that point the decision was made to proceed with grafting of the LAD and give additional cardioplegia through the radial  ?artery.  The left internal mammary artery was divided and the proximal portion was sclerotic, but the distal portion was patent and with cardioplegia administration, there was backbleeding through the vessel and it was clipped.  The LAD was dissected  ?out.  It was a 1.5 mm fair quality target vessel.  The radial artery was a 2 mm good quality conduit.  An end-to-side anastomosis was performed with a running 8-0 Prolene suture.  After completion of the anastomosis, a probe passed easily proximally and  ?distally.  Cardioplegia was administered  down the graft.  There was good flow and good hemostasis.  200 mL of cardioplegia was administered and there was additional septal cooling.  Next, the obtuse marginal branch was dissected out.  This was difficult  ?to locate as it was intramyocardial beyond the anastomosis, but it was a 1.5 mm fair quality target once identified. The vein graft was of good quality.  It was anastomosed end-to-side with a running 7-0 Prolene suture.  Again, a probe passed easily  ?proximally and distally and there was good flow and good hemostasis with cardioplegia administration. ? ?Next, the heart was  elevated exposing the inferior wall. The posterolateral vessel was dissected out.  Again, this was difficult due to scar tissue, but it was a 1.5 mm fair quality target.  The vein was of good quality.  It was anastomos

## 2022-03-05 NOTE — Discharge Summary (Signed)
? ?   ?[address] ?      [place] [address] ?            [phone]   ? ?Physician Discharge Summary  ?Patient ID: ?Miguel Vasquez ?MRN: [id number] ?DOB/AGE: November 13, 1945 76 y.o. ? ?Admit date: 03/05/2022 ?Discharge date: 03/09/2022 ? ?Admission Diagnoses: ? ?Patient Active Problem List  ? Diagnosis Date Noted  ? Narrow complex tachycardia ([place]) 06/05/2016  ? CAD (coronary artery disease) of artery bypass graft, VG-OM occluded,  05/20/2016  ? S/P primary angioplasty with coronary stent and rotational atherectomy of ostial mRCA with DES X 2-- 7/11/7 05/20/2016  ? Angina pectoris ([place])   ? Abnormal stress test   ? 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 Transaminitis 10/02/2014  ? Unstable angina ([place]) 09/28/2014  ? Chest pain 09/20/2014  ? Hypercholesterolemia   ? Coronary disease   ? Hypertension   ? Diabetes mellitus ([place])   ? ? ? ?Discharge Diagnoses:  ?Patient Active Problem List  ? Diagnosis Date Noted  ? H/O three vessel coronary artery bypass 03/06/2022  ? Narrow complex tachycardia ([place]) 06/05/2016  ? CAD (coronary artery disease) of artery bypass graft, VG-OM occluded,  05/20/2016  ? S/P primary angioplasty with coronary stent and rotational atherectomy of ostial mRCA with DES X 2-- 7/11/7 05/20/2016  ? Angina pectoris ([place])   ? Abnormal stress test   ? 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 Transaminitis 10/02/2014  ? Unstable angina ([place]) 09/28/2014  ? Chest pain 09/20/2014  ? Hypercholesterolemia   ? Coronary disease   ? Hypertension   ? Diabetes mellitus ([place]) ?Expected acute blood loss anemia ?Thrombocytopenia   ? ? ? ?Discharged Condition: stable ? ?  ?HPI: Miguel Vasquez was sent for consideration for redo bypass grafting for three-vessel coronary disease. ?  ?Miguel Vasquez is a 76 year old man with a past medical history significant for coronary artery disease, previous stents and rotational atherectomy, coronary bypass grafting, hypertension, hyperlipidemia, type 2 diabetes, mild aortic insufficiency, panic disorder,  hypothyroidism, gout, and thrombocytopenia.  He had coronary bypass grafting x3 in 2015.  He was noted to have good targets and good conduits.  His vein graft to the OM failed relatively early likely due to competitive flow.  Later had the vein graft to the posterior lateral became occluded. ? ?Recently, he noted some shortness of breath and vague tightness in his chest with lifting some heavy bags in his yard.  He saw Dr. Martinique.  He underwent cardiac catheterization on 02/06/2022.  It revealed all 3 grafts were occluded.  He had severe native three-vessel coronary disease.  Not favorable for percutaneous intervention.  Preserved left ventricular function. ? ?Dr. Roxan Hockey evaluated the patient and all relevant studies and recommended proceeding with redo CABG.  He was admitted this hospitalization for the procedure. ? ?Hospital Course: ? ?The patient was admitted electively and taken the operating room on 03/05/2022 at which time he underwent redo CABG x3 with open right radial artery harvest and open left saphenous vein harvest. He tolerated the procedure well was taken to the surgical intensive care unit in stable condition.  He remained hemodynamically stable.  He was weaned from the mechanical ventilator and extubated routinely.  He was mobilized on the first postoperative day.  Chest tubes were removed.  He was thrombocytopenic but did not have any significant coagulopathy after arrival to the ICU.  This was monitored and his platelet count gradually recovered through the remainder of the admission.  He progressed well with mobility.  He did not have any significant arrhythmias.  By the second postoperative day, he was ready for transfer to 4 E. progressive care.  Diet and activity were advanced.  He had return of appropriate bowel and bladder function.  He weaned from the supplemental oxygen without any trouble.  He had expected acute blood loss anemia after surgery that was monitored and ending back toward  normal by the time of his discharge.  He did not require transfusion.  By the fourth postoperative day, he was feeling well and ambulating independently.  His sternotomy incision was healing with no sign of complication.  He had expected bruising in his left thigh with mild serosanguineous drainage from the lower thigh incision.  There is no evidence of infection.  Patient wires were removed without complication.  He was monitored following pacer wire removal per protocol.  He was felt to be ready for discharge home later in the day on postop day 4. ?  ? ?Consults: None ? ?Significant Diagnostic Studies:  ? ?CLINICAL DATA:  Recent CABG ?  ?EXAM: ?PORTABLE CHEST 1 VIEW ?  ?COMPARISON:  03/06/2022 ?  ?FINDINGS: ?Interval removal of pulmonary arterial catheter. Left IJ sheath ?remains in place. Mediastinal drain has also been removed. Stable ?cardiomediastinal contour status post CABG. Aortic atherosclerosis. ?Slightly low lung volumes with mild bibasilar atelectasis. Possible ?trace left pleural effusion. No pneumothorax. ?  ?IMPRESSION: ?Mild bibasilar atelectasis and possible trace left pleural effusion. ?  ?  ?Electronically Signed ?  By: Davina Poke D.O. ?  On: 03/07/2022 11:29 ?  ? ? ?Treatments: Surgery ? ?Operative Report  ?  ?DATE OF PROCEDURE: 03/05/2022 ?  ?PREPROCEDURE DIAGNOSIS:  Severe 3-vessel coronary disease with occluded bypass grafts. ?  ?POSTOPERATIVE DIAGNOSIS:  Severe 3-vessel coronary disease with occluded bypass grafts. ?  ?PROCEDURE:  Redo median sternotomy, extracorporeal circulation, redo coronary artery bypass grafting x3 (right radial artery to LAD, saphenous vein graft to obtuse marginal, saphenous vein graft to posterolateral), open vein harvest of left leg, open  ?right radial artery harvest. ?  ?SURGEON:  Revonda Standard. Roxan Hockey, MD ?  ?ASSISTANT:  Jadene Pierini, PA ?  ?ANESTHESIA:  General. ?  ?FINDINGS:  Severe intrapericardial adhesions. Mammary and saphenous veins sclerotic. Fair  quality targets. Severe aortic calcification. ?  ?CLINICAL NOTE:  The patient is a 76 year old gentleman with history of coronary disease and multiple previous coronary interventions including coronary artery bypass grafting in 2015. He presented with shortness of breath and chest tightness and was  ?found to have severe native 3-vessel disease with all three previous grafts occluded.  He was referred for redo coronary artery bypass grafting.  The indications, risks, benefits, and alternatives were discussed in detail with the patient.  He understood ? and accepted the risks and agreed to proceed. ? ? ?Discharge Exam: ?Blood pressure (!) 172/88, pulse 92, temperature 98.1 ?F (36.7 ?C), temperature source Oral, resp. rate (!) 21, height '6\' 1"'$  (1.854 m), weight 98.2 kg, SpO2 100 %. ?General appearance: alert, cooperative, and no distress ?Heart: regular rate and rhythm ?Lungs: clear to auscultation bilaterally ?Abdomen: benign ?Extremities: + minor edema ?Wound: incis healing well, + echymosis left thigh, no definate hematoma ?Right hand N/V intact ? ? ?Discharge Medications: ? ?The patient has been discharged on: ? ? ?1.Beta Blocker:  Yes [  x ] ?  No   [   ] ?                             If No, reason: ? ?2.Ace Inhibitor/ARB: Yes [ x ] ?                                    No  [  ] ?                                    If No, reason:  ? ?3.Statin:   Yes [  x ] ?                 No  [   ] ?                 If No, reason: ? ?4.Ecasa:  Yes  [ x  ] ?                 No   [   ] ?                 If No, reason: ? ?Patient had ACS upon admission: ? ?Plavix/P2Y12 inhibitor: Yes [   ] ?                                     No  [ x  ] ? ? ? ? ?Discharge Instructions   ? ? AMB Referral to Cardiac Rehabilitation - Phase II   Complete by: As directed ?  ? Diagnosis: CABG  ? CABG X ___: 3 Comment - REDO  ? After initial evaluation and assessments completed: Virtual Based Care may be provided alone or in  conjunction with Phase 2 Cardiac Rehab based on patient barriers.: Yes  ? ?  ? ?Allergies as of 03/09/2022   ? ?   Reactions  ? Statins Other (See Comments)  ? "Affects liver enzymes"  ? ?  ? ?  ?Medication

## 2022-03-06 ENCOUNTER — Encounter (HOSPITAL_COMMUNITY): Payer: Self-pay | Admitting: Thoracic Surgery (Cardiothoracic Vascular Surgery)

## 2022-03-06 ENCOUNTER — Inpatient Hospital Stay (HOSPITAL_COMMUNITY): Payer: Medicare PPO

## 2022-03-06 DIAGNOSIS — Z951 Presence of aortocoronary bypass graft: Secondary | ICD-10-CM | POA: Insufficient documentation

## 2022-03-06 LAB — PREPARE PLATELET PHERESIS: Unit division: 0

## 2022-03-06 LAB — POCT I-STAT 7, (LYTES, BLD GAS, ICA,H+H)
Acid-base deficit: 2 mmol/L (ref 0.0–2.0)
Acid-base deficit: 2 mmol/L (ref 0.0–2.0)
Bicarbonate: 22.5 mmol/L (ref 20.0–28.0)
Bicarbonate: 23.3 mmol/L (ref 20.0–28.0)
Calcium, Ion: 1.16 mmol/L (ref 1.15–1.40)
Calcium, Ion: 1.19 mmol/L (ref 1.15–1.40)
HCT: 25 % — ABNORMAL LOW (ref 39.0–52.0)
HCT: 27 % — ABNORMAL LOW (ref 39.0–52.0)
Hemoglobin: 8.5 g/dL — ABNORMAL LOW (ref 13.0–17.0)
Hemoglobin: 9.2 g/dL — ABNORMAL LOW (ref 13.0–17.0)
O2 Saturation: 99 %
O2 Saturation: 99 %
Patient temperature: 36.4
Patient temperature: 36.9
Potassium: 4.4 mmol/L (ref 3.5–5.1)
Potassium: 4.5 mmol/L (ref 3.5–5.1)
Sodium: 138 mmol/L (ref 135–145)
Sodium: 140 mmol/L (ref 135–145)
TCO2: 24 mmol/L (ref 22–32)
TCO2: 24 mmol/L (ref 22–32)
pCO2 arterial: 36.6 mmHg (ref 32–48)
pCO2 arterial: 38.6 mmHg (ref 32–48)
pH, Arterial: 7.388 (ref 7.35–7.45)
pH, Arterial: 7.393 (ref 7.35–7.45)
pO2, Arterial: 131 mmHg — ABNORMAL HIGH (ref 83–108)
pO2, Arterial: 147 mmHg — ABNORMAL HIGH (ref 83–108)

## 2022-03-06 LAB — BASIC METABOLIC PANEL
Anion gap: 6 (ref 5–15)
Anion gap: 9 (ref 5–15)
BUN: 9 mg/dL (ref 8–23)
BUN: 9 mg/dL (ref 8–23)
CO2: 21 mmol/L — ABNORMAL LOW (ref 22–32)
CO2: 22 mmol/L (ref 22–32)
Calcium: 7.5 mg/dL — ABNORMAL LOW (ref 8.9–10.3)
Calcium: 7.8 mg/dL — ABNORMAL LOW (ref 8.9–10.3)
Chloride: 106 mmol/L (ref 98–111)
Chloride: 104 mmol/L (ref 98–111)
Creatinine, Ser: 0.77 mg/dL (ref 0.61–1.24)
Creatinine, Ser: 0.85 mg/dL (ref 0.61–1.24)
GFR, Estimated: >60 mL/min (ref >60)
GFR, Estimated: >60 mL/min (ref >60)
Glucose, Bld: 142 mg/dL — ABNORMAL HIGH (ref 70–99)
Glucose, Bld: 200 mg/dL — ABNORMAL HIGH (ref 70–99)
Potassium: 3.8 mmol/L (ref 3.5–5.1)
Potassium: 4 mmol/L (ref 3.5–5.1)
Sodium: 133 mmol/L — ABNORMAL LOW (ref 135–145)
Sodium: 135 mmol/L (ref 135–145)

## 2022-03-06 LAB — PREPARE CRYOPRECIPITATE: Unit division: 0

## 2022-03-06 LAB — BPAM FFP
Blood Product Expiration Date: 202304292359
Blood Product Expiration Date: 202304292359
ISSUE DATE / TIME: 202304271459
ISSUE DATE / TIME: 202304271459
Unit Type and Rh: 5100
Unit Type and Rh: 5100

## 2022-03-06 LAB — CBC
HCT: 25.1 % — ABNORMAL LOW (ref 39.0–52.0)
HCT: 26.1 % — ABNORMAL LOW (ref 39.0–52.0)
Hemoglobin: 8.9 g/dL — ABNORMAL LOW (ref 13.0–17.0)
Hemoglobin: 8.6 g/dL — ABNORMAL LOW (ref 13.0–17.0)
MCH: 31.8 pg (ref 26.0–34.0)
MCH: 30.4 pg (ref 26.0–34.0)
MCHC: 35.5 g/dL (ref 30.0–36.0)
MCHC: 33 g/dL (ref 30.0–36.0)
MCV: 89.6 fL (ref 80.0–100.0)
MCV: 92.2 fL (ref 80.0–100.0)
Platelets: 88 K/uL — ABNORMAL LOW (ref 150–400)
Platelets: 98 10*3/uL — ABNORMAL LOW (ref 150–400)
RBC: 2.8 MIL/uL — ABNORMAL LOW (ref 4.22–5.81)
RBC: 2.83 MIL/uL — ABNORMAL LOW (ref 4.22–5.81)
RDW: 13.2 % (ref 11.5–15.5)
RDW: 13.4 % (ref 11.5–15.5)
WBC: 6.9 K/uL (ref 4.0–10.5)
WBC: 8.4 10*3/uL (ref 4.0–10.5)
nRBC: 0 % (ref 0.0–0.2)
nRBC: 0 % (ref 0.0–0.2)

## 2022-03-06 LAB — ECHO INTRAOPERATIVE TEE
AR max vel: 2.98 cm2
AV Area VTI: 2.79 cm2
AV Area mean vel: 2.74 cm2
AV Mean grad: 4 mmHg
AV Peak grad: 8.6 mmHg
Ao pk vel: 1.47 m/s
Height: 73 in
P 1/2 time: 531 msec
Weight: 3392 oz

## 2022-03-06 LAB — BPAM PLATELET PHERESIS
Blood Product Expiration Date: 202304302359
ISSUE DATE / TIME: 202304271803
Unit Type and Rh: 6200

## 2022-03-06 LAB — PREPARE FRESH FROZEN PLASMA: Unit division: 0

## 2022-03-06 LAB — GLUCOSE, CAPILLARY
Glucose-Capillary: 137 mg/dL — ABNORMAL HIGH (ref 70–99)
Glucose-Capillary: 138 mg/dL — ABNORMAL HIGH (ref 70–99)
Glucose-Capillary: 126 mg/dL — ABNORMAL HIGH (ref 70–99)
Glucose-Capillary: 142 mg/dL — ABNORMAL HIGH (ref 70–99)
Glucose-Capillary: 152 mg/dL — ABNORMAL HIGH (ref 70–99)
Glucose-Capillary: 169 mg/dL — ABNORMAL HIGH (ref 70–99)
Glucose-Capillary: 238 mg/dL — ABNORMAL HIGH (ref 70–99)
Glucose-Capillary: 210 mg/dL — ABNORMAL HIGH (ref 70–99)
Glucose-Capillary: 186 mg/dL — ABNORMAL HIGH (ref 70–99)
Glucose-Capillary: 167 mg/dL — ABNORMAL HIGH (ref 70–99)

## 2022-03-06 LAB — BPAM CRYOPRECIPITATE
Blood Product Expiration Date: 202304272330
ISSUE DATE / TIME: 202304271802
Unit Type and Rh: 5100

## 2022-03-06 LAB — MAGNESIUM
Magnesium: 2.4 mg/dL (ref 1.7–2.4)
Magnesium: 2.4 mg/dL (ref 1.7–2.4)

## 2022-03-06 MED ORDER — INSULIN ASPART 100 UNIT/ML IJ SOLN
0.0000 [IU] | INTRAMUSCULAR | Status: DC
  Administered 2022-03-06 (×2): 4 [IU] via SUBCUTANEOUS
  Administered 2022-03-06: 8 [IU] via SUBCUTANEOUS
  Administered 2022-03-07: 4 [IU] via SUBCUTANEOUS
  Administered 2022-03-07: 2 [IU] via SUBCUTANEOUS
  Administered 2022-03-07: 8 [IU] via SUBCUTANEOUS
  Administered 2022-03-07 (×2): 4 [IU] via SUBCUTANEOUS

## 2022-03-06 MED ORDER — ISOSORBIDE MONONITRATE ER 30 MG PO TB24
30.0000 mg | ORAL_TABLET | Freq: Every day | ORAL | Status: DC
  Administered 2022-03-06 – 2022-03-09 (×4): 30 mg via ORAL
  Filled 2022-03-06 (×4): qty 1

## 2022-03-06 MED ORDER — CHLORHEXIDINE GLUCONATE CLOTH 2 % EX PADS: 6.0000 | MEDICATED_PAD | Freq: Every day | CUTANEOUS | Status: DC

## 2022-03-06 MED ORDER — POTASSIUM CHLORIDE 10 MEQ/50ML IV SOLN
10.0000 meq | INTRAVENOUS | Status: AC
  Administered 2022-03-06 (×3): 10 meq via INTRAVENOUS
  Filled 2022-03-06 (×2): qty 50

## 2022-03-06 MED ORDER — INSULIN DETEMIR 100 UNIT/ML ~~LOC~~ SOLN
25.0000 [IU] | Freq: Two times a day (BID) | SUBCUTANEOUS | Status: DC
  Administered 2022-03-06 – 2022-03-08 (×5): 25 [IU] via SUBCUTANEOUS
  Filled 2022-03-06 (×8): qty 0.25

## 2022-03-06 MED ORDER — CHLORHEXIDINE GLUCONATE CLOTH 2 % EX PADS
6.0000 | MEDICATED_PAD | Freq: Every day | CUTANEOUS | Status: DC
  Administered 2022-03-07 – 2022-03-08 (×2): 6 via TOPICAL

## 2022-03-06 MED ORDER — GLIPIZIDE ER 5 MG PO TB24
5.0000 mg | ORAL_TABLET | Freq: Every day | ORAL | Status: DC
  Administered 2022-03-06 – 2022-03-09 (×4): 5 mg via ORAL
  Filled 2022-03-06 (×4): qty 1

## 2022-03-06 MED ORDER — METFORMIN HCL 500 MG PO TABS
1000.0000 mg | ORAL_TABLET | Freq: Two times a day (BID) | ORAL | Status: DC
  Administered 2022-03-07 – 2022-03-09 (×5): 1000 mg via ORAL
  Filled 2022-03-06 (×5): qty 2

## 2022-03-06 MED ORDER — FOLIC ACID 1 MG PO TABS
500.0000 ug | ORAL_TABLET | Freq: Every day | ORAL | Status: DC
  Administered 2022-03-06 – 2022-03-09 (×4): 0.5 mg via ORAL
  Filled 2022-03-06 (×4): qty 1

## 2022-03-06 MED ORDER — FUROSEMIDE 10 MG/ML IJ SOLN
20.0000 mg | Freq: Once | INTRAMUSCULAR | Status: AC
  Administered 2022-03-06: 20 mg via INTRAVENOUS
  Filled 2022-03-06: qty 2

## 2022-03-06 NOTE — Anesthesia Postprocedure Evaluation (Signed)
Anesthesia Post Note ? ?Patient: Aster Eckrich ? ?Procedure(s) Performed: REDO CORONARY ARTERY BYPASS GRAFTING (CABG) X 3 WITH HARVESTED LEFT GREATER SAPHENOUS VEIN AND RIGHT RADIAL ARTERY (Chest) ?TRANSESOPHAGEAL ECHOCARDIOGRAM (TEE) ?RADIAL ARTERY HARVEST (Right: Arm Lower) ?ENDOVEIN HARVEST OF GREATER SAPHENOUS VEIN (Left) ? ?  ? ?Patient location during evaluation: SICU ?Anesthesia Type: General ?Level of consciousness: sedated ?Pain management: pain level controlled ?Vital Signs Assessment: post-procedure vital signs reviewed and stable ?Respiratory status: patient remains intubated per anesthesia plan ?Cardiovascular status: stable ?Postop Assessment: no apparent nausea or vomiting ?Anesthetic complications: no ? ? ?No notable events documented. ? ?Last Vitals:  ?Vitals:  ? 03/06/22 1900 03/06/22 2000  ?BP: 110/64   ?Pulse: 76 77  ?Resp: 19 18  ?Temp:  36.7 ?C  ?SpO2: 100% 100%  ?  ?Last Pain:  ?Vitals:  ? 03/06/22 2000  ?TempSrc: Oral  ?PainSc: 0-No pain  ? ? ?  ?  ?  ?  ?  ?  ? ?Suzette Battiest E ? ? ? ? ?

## 2022-03-06 NOTE — Progress Notes (Signed)
Inpatient Diabetes Program Recommendations ? ?AACE/ADA: New Consensus Statement on Inpatient Glycemic Control (2015) ? ?Target Ranges:  Prepandial:   less than 140 mg/dL ?     Peak postprandial:   less than 180 mg/dL (1-2 hours) ?     Critically ill patients:  140 - 180 mg/dL  ? ?Lab Results  ?Component Value Date  ? GLUCAP 210 (H) 03/06/2022  ? HGBA1C 6.9 (H) 03/03/2022  ? ? ?Review of Glycemic Control ? ?Diabetes history: DM2 ?Outpatient Diabetes medications: glipizide 5 mg QAM, metformin 1000 mg BID, Novolog 0-24 units Q4H ?Current orders for Inpatient glycemic control: Levemir 25 BID, glipizide 5 mg QAM, metformin 1000 mg BID ? ?HgbA1C - 6.9% ? ?Inpatient Diabetes Program Recommendations:   ? ?Consider discontinuing glipizide while inpatient, to prevent hypoglycemia ? ?If post-prandials > 180 mg/dL, consider adding meal coverage insulin - Novolog 4 units TID if eating > 50%. ? ?Will likely need less basal insulin - follow glucose trends with goal 140-180 mg/dL. ? ? ?Thank you. ?Lorenda Peck, RD, LDN, CDE ?Inpatient Diabetes Coordinator ?[phone]  ? ? ? ? ? ? ? ? ?

## 2022-03-06 NOTE — Progress Notes (Signed)
1 Day Post-Op Procedure(s) (LRB): ?REDO CORONARY ARTERY BYPASS GRAFTING (CABG) X 3 WITH HARVESTED LEFT GREATER SAPHENOUS VEIN AND RIGHT RADIAL ARTERY (N/A) ?TRANSESOPHAGEAL ECHOCARDIOGRAM (TEE) (N/A) ?RADIAL ARTERY HARVEST (Right) ?ENDOVEIN HARVEST OF GREATER SAPHENOUS VEIN (Left) ?Subjective: ?Some incisional pain ? ?Objective: ?Vital signs in last 24 hours: ?Temp:  [96.3 ?F (35.7 ?C)-98.4 ?F (36.9 ?C)] 96.4 ?F (35.8 ?C) (04/28 0700) ?Pulse Rate:  [79-89] 89 (04/28 0700) ?Resp:  [8-30] 26 (04/28 0700) ?BP: (99-136)/(71-92) 136/80 (04/28 0600) ?SpO2:  [99 %-100 %] 100 % (04/28 0700) ?Arterial Line BP: (96-153)/(53-89) 151/67 (04/28 0700) ?FiO2 (%):  [40 %-50 %] 40 % (04/27 2209) ? ?Hemodynamic parameters for last 24 hours: ?PAP: (12-26)/(3-13) 26/13 ?CO:  [2.8 L/min-6.7 L/min] 6.7 L/min ?CI:  [1.3 L/min/m2-3 L/min/m2] 3 L/min/m2 ? ?Intake/Output from previous day: ?04/27 0701 - 04/28 0700 ?In: 8440.4 [I.V.:4708.4; Blood:1778; IV [id number]:6010] ?Out: 9323 [Urine:4300; Blood:508; Chest Tube:790] ?Intake/Output this shift: ?No intake/output data recorded. ? ?General appearance: alert, cooperative, and no distress ?Neurologic: intact ?Heart: regular rate and rhythm ?Lungs: diminished breath sounds bibasilar ?Abdomen: normal findings: soft, non-tender ? ?Lab Results: ?Recent Labs  ?  03/05/22 ?1603 03/05/22 ?5573 03/05/22 ?2359 03/06/22 ?0552  ?WBC 6.2  --   --  6.9  ?HGB 9.9*   < > 8.5* 8.9*  ?HCT 30.1*   < > 25.0* 25.1*  ?PLT 84*  --   --  88*  ? < > = values in this interval not displayed.  ? ?BMET:  ?Recent Labs  ?  03/03/22 ?1200 03/05/22 ?0920 03/05/22 ?1440 03/05/22 ?1614 03/05/22 ?2359 03/06/22 ?0552  ?NA 135   < > 139   < > 138 133*  ?K 4.2   < > 4.3   < > 4.4 3.8  ?CL 105   < > 102  --   --  106  ?CO2 20*  --   --   --   --  21*  ?GLUCOSE 158*   < > 113*  --   --  142*  ?BUN 15   < > 11  --   --  9  ?CREATININE 0.81   < > 0.50*  --   --  0.77  ?CALCIUM 9.5  --   --   --   --  7.5*  ? < > = values in this  interval not displayed.  ?  ?PT/INR:  ?Recent Labs  ?  03/05/22 ?1603  ?LABPROT 17.6*  ?INR 1.5*  ? ?ABG ?   ?Component Value Date/Time  ? PHART 7.393 03/05/2022 2359  ? HCO3 22.5 03/05/2022 2359  ? TCO2 24 03/05/2022 2359  ? ACIDBASEDEF 2.0 03/05/2022 2359  ? O2SAT 99 03/05/2022 2359  ? ?CBG (last 3)  ?Recent Labs  ?  03/06/22 ?0122 03/06/22 ?0409 03/06/22 ?2202  ?GLUCAP 138* 142* 126*  ? ? ?Assessment/Plan: ?S/P Procedure(s) (LRB): ?REDO CORONARY ARTERY BYPASS GRAFTING (CABG) X 3 WITH HARVESTED LEFT GREATER SAPHENOUS VEIN AND RIGHT RADIAL ARTERY (N/A) ?TRANSESOPHAGEAL ECHOCARDIOGRAM (TEE) (N/A) ?RADIAL ARTERY HARVEST (Right) ?ENDOVEIN HARVEST OF GREATER SAPHENOUS VEIN (Left) ?- POD # 1 Looks great this AM ?NEURO- intact ?CV- in SR with good hemodynamics ? Dc Swan, A line ? ASA, beta blocker, statin ? Imdur for radial graft ?RESP_ IS for atelectasis ?RENAL- creatinine and lytes oK ? Diurese ?ENDO- CBG controlled with insulin drip ? Transition to levemir + SSI ? Restart glucotrol today, metformin tomorrow ?GI- advance diet as tolerated ?Coagulopathy resolved ?Thrombocytopenia- no enoxaparin for now, monitor ?Anemia  secondary to ABL- stable monitor ?Dc chest tubes ?SCD or DVT prophylaxis ?mobilize ? ? LOS: 1 day  ? ? ?Melrose Nakayama ?03/06/2022 ? ? ?

## 2022-03-06 NOTE — Progress Notes (Signed)
EVENING ROUNDS NOTE : ? ?   ?[address] ?      [place] [address] ?            [phone]   ?              ?1 Day Post-Op ?Procedure(s) (LRB): ?REDO CORONARY ARTERY BYPASS GRAFTING (CABG) X 3 WITH HARVESTED LEFT GREATER SAPHENOUS VEIN AND RIGHT RADIAL ARTERY (N/A) ?TRANSESOPHAGEAL ECHOCARDIOGRAM (TEE) (N/A) ?RADIAL ARTERY HARVEST (Right) ?ENDOVEIN HARVEST OF GREATER SAPHENOUS VEIN (Left) ? ? ?Total Length of Stay:  LOS: 1 day  ?Events:   ?No events ?Ambulated today ? ? ? ?BP 108/62   Pulse 84   Temp 98.7 ?F (37.1 ?C) (Oral)   Resp 19   Ht '6\' 1"'$  (1.854 m)   Wt 96.2 kg   SpO2 100%   BMI 27.97 kg/m?  ? ?PAP: (12-26)/(3-13) 25/12 ?CO:  [2.8 L/min-6.7 L/min] 6.7 L/min ?CI:  [1.3 L/min/m2-3 L/min/m2] 3 L/min/m2 ? ?Vent Mode: CPAP;PSV ?FiO2 (%):  [40 %-50 %] 40 % ?Set Rate:  [4 bmp-12 bmp] 4 bmp ?Vt Set:  [628 mL] 630 mL ?PEEP:  [5 cmH20] 5 cmH20 ?Pressure Support:  [10 cmH20] 10 cmH20 ?Plateau Pressure:  [19 cmH20] 19 cmH20 ? ? sodium chloride Stopped (03/05/22 2246)  ? sodium chloride 250 mL (03/06/22 0751)  ? sodium chloride 20 mL/hr at 03/05/22 1600  ?  ceFAZolin (ANCEF) IV Stopped (03/06/22 1438)  ? dexmedetomidine (PRECEDEX) IV infusion Stopped (03/05/22 2304)  ? insulin Stopped (03/06/22 3662)  ? lactated ringers    ? lactated ringers Stopped (03/05/22 1600)  ? lactated ringers Stopped (03/05/22 2215)  ? phenylephrine (NEO-SYNEPHRINE) Adult infusion Stopped (03/06/22 0734)  ? ? ?I/O last 3 completed shifts: ?In: 8440.4 [I.V.:4708.4; Blood:1778; IV [id number]:3546] ?Out: 5681 [Urine:4300; Blood:508; Chest Tube:790] ? ? ? ?  Latest Ref Rng & Units 03/06/2022  ?  5:00 PM 03/06/2022  ?  5:52 AM 03/05/2022  ? 11:59 PM  ?CBC  ?WBC 4.0 - 10.5 K/uL 8.4   6.9     ?Hemoglobin 13.0 - 17.0 g/dL 8.6   8.9   8.5    ?Hematocrit 39.0 - 52.0 % 26.1   25.1   25.0    ?Platelets 150 - 400 K/uL 98   88     ? ? ? ?  Latest Ref Rng & Units 03/06/2022  ?  5:52 AM 03/05/2022  ? 11:59 PM 03/05/2022  ? 10:36 PM  ?BMP   ?Glucose 70 - 99 mg/dL 142      ?BUN 8 - 23 mg/dL 9      ?Creatinine 0.61 - 1.24 mg/dL 0.77      ?Sodium 135 - 145 mmol/L 133   138   140    ?Potassium 3.5 - 5.1 mmol/L 3.8   4.4   4.5    ?Chloride 98 - 111 mmol/L 106      ?CO2 22 - 32 mmol/L 21      ?Calcium 8.9 - 10.3 mg/dL 7.5      ? ? ?ABG ?   ?Component Value Date/Time  ? PHART 7.393 03/05/2022 2359  ? PCO2ART 36.6 03/05/2022 2359  ? PO2ART 147 (H) 03/05/2022 2359  ? HCO3 22.5 03/05/2022 2359  ? TCO2 24 03/05/2022 2359  ? ACIDBASEDEF 2.0 03/05/2022 2359  ? O2SAT 99 03/05/2022 2359  ? ? ? ? ? ?Melodie Bouillon, MD ?03/06/2022 5:45 PM ? ? ?

## 2022-03-06 NOTE — Care Management (Signed)
?  Transition of Care (TOC) Screening Note ? ? ?Patient Details  ?Name: Miguel Vasquez ?Date of Birth: 14-Mar-1946 ? ? ?Transition of Care (TOC) CM/SW Contact:    ?Graves-Bigelow, Ocie Cornfield, RN ?Phone Number: ?03/06/2022, 1:51 PM ? ? ? ?Transition of Care Department [facility]) has reviewed the patient and no TOC needs have been identified at this time. We will continue to monitor patient advancement through interdisciplinary progression rounds. If new patient transition needs arise, please place a TOC consult. ? ? ?

## 2022-03-07 ENCOUNTER — Inpatient Hospital Stay (HOSPITAL_COMMUNITY): Payer: Medicare PPO

## 2022-03-07 LAB — GLUCOSE, CAPILLARY
Glucose-Capillary: 169 mg/dL — ABNORMAL HIGH (ref 70–99)
Glucose-Capillary: 155 mg/dL — ABNORMAL HIGH (ref 70–99)
Glucose-Capillary: 171 mg/dL — ABNORMAL HIGH (ref 70–99)
Glucose-Capillary: 171 mg/dL — ABNORMAL HIGH (ref 70–99)
Glucose-Capillary: 213 mg/dL — ABNORMAL HIGH (ref 70–99)
Glucose-Capillary: 136 mg/dL — ABNORMAL HIGH (ref 70–99)
Glucose-Capillary: 90 mg/dL (ref 70–99)

## 2022-03-07 LAB — BASIC METABOLIC PANEL
Anion gap: 6 (ref 5–15)
BUN: 10 mg/dL (ref 8–23)
CO2: 25 mmol/L (ref 22–32)
Calcium: 7.8 mg/dL — ABNORMAL LOW (ref 8.9–10.3)
Chloride: 104 mmol/L (ref 98–111)
Creatinine, Ser: 0.81 mg/dL (ref 0.61–1.24)
GFR, Estimated: >60 mL/min (ref >60)
Glucose, Bld: 160 mg/dL — ABNORMAL HIGH (ref 70–99)
Potassium: 3.7 mmol/L (ref 3.5–5.1)
Sodium: 135 mmol/L (ref 135–145)

## 2022-03-07 LAB — CBC
HCT: 23.5 % — ABNORMAL LOW (ref 39.0–52.0)
Hemoglobin: 8.1 g/dL — ABNORMAL LOW (ref 13.0–17.0)
MCH: 31.6 pg (ref 26.0–34.0)
MCHC: 34.5 g/dL (ref 30.0–36.0)
MCV: 91.8 fL (ref 80.0–100.0)
Platelets: 77 10*3/uL — ABNORMAL LOW (ref 150–400)
RBC: 2.56 MIL/uL — ABNORMAL LOW (ref 4.22–5.81)
RDW: 13.6 % (ref 11.5–15.5)
WBC: 7.8 10*3/uL (ref 4.0–10.5)
nRBC: 0 % (ref 0.0–0.2)

## 2022-03-07 MED ORDER — FUROSEMIDE 10 MG/ML IJ SOLN
40.0000 mg | Freq: Once | INTRAMUSCULAR | Status: AC
  Administered 2022-03-07: 40 mg via INTRAVENOUS
  Filled 2022-03-07: qty 4

## 2022-03-07 MED ORDER — SODIUM CHLORIDE 0.9% FLUSH: 3.0000 mL | INTRAVENOUS | Status: DC | PRN

## 2022-03-07 MED ORDER — METOPROLOL TARTRATE 25 MG PO TABS
25.0000 mg | ORAL_TABLET | Freq: Two times a day (BID) | ORAL | Status: DC
Start: 2022-03-07 — End: 2022-03-09
  Administered 2022-03-07 – 2022-03-09 (×4): 25 mg via ORAL
  Filled 2022-03-07 (×4): qty 1

## 2022-03-07 MED ORDER — SODIUM CHLORIDE 0.9 % IV SOLN: 250.0000 mL | INTRAVENOUS | Status: DC | PRN

## 2022-03-07 MED ORDER — POTASSIUM CHLORIDE CRYS ER 20 MEQ PO TBCR
20.0000 meq | EXTENDED_RELEASE_TABLET | ORAL | Status: AC
  Administered 2022-03-07 (×3): 20 meq via ORAL
  Filled 2022-03-07 (×3): qty 1

## 2022-03-07 MED ORDER — SODIUM CHLORIDE 0.9% FLUSH
3.0000 mL | Freq: Two times a day (BID) | INTRAVENOUS | Status: DC
  Administered 2022-03-07 – 2022-03-08 (×2): 3 mL via INTRAVENOUS

## 2022-03-07 MED ORDER — ~~LOC~~ CARDIAC SURGERY, PATIENT & FAMILY EDUCATION: Freq: Once | Status: AC

## 2022-03-07 NOTE — Progress Notes (Signed)
? ?   ?[address] ?      [place] [address] ?            [phone]   ?              ?2 Days Post-Op ?Procedure(s) (LRB): ?REDO CORONARY ARTERY BYPASS GRAFTING (CABG) X 3 WITH HARVESTED LEFT GREATER SAPHENOUS VEIN AND RIGHT RADIAL ARTERY (N/A) ?TRANSESOPHAGEAL ECHOCARDIOGRAM (TEE) (N/A) ?RADIAL ARTERY HARVEST (Right) ?ENDOVEIN HARVEST OF GREATER SAPHENOUS VEIN (Left) ? ? ?Events: ?No events ?_______________________________________________________________ ?Vitals: ?BP 117/67 (BP Location: Left Arm)   Pulse 72   Temp 97.7 ?F (36.5 ?C) (Oral)   Resp 18   Ht '6\' 1"'$  (1.854 m)   Wt 101.2 kg   SpO2 93%   BMI 29.44 kg/m?  ?Filed Weights  ? 03/05/22 0602 03/07/22 0600  ?Weight: 96.2 kg 101.2 kg  ? ? ? ?- Neuro: alert NAD ? ?- Cardiovascular: sinus ? Drips: none.   ?  ? ?- Pulm: EWOB ?  ? ?ABG ?   ?Component Value Date/Time  ? PHART 7.393 03/05/2022 2359  ? PCO2ART 36.6 03/05/2022 2359  ? PO2ART 147 (H) 03/05/2022 2359  ? HCO3 22.5 03/05/2022 2359  ? TCO2 24 03/05/2022 2359  ? ACIDBASEDEF 2.0 03/05/2022 2359  ? O2SAT 99 03/05/2022 2359  ? ? ?- Abd: ND ?- Extremity: warm ? ?Marland KitchenIntake/Output   ?   04/28 0701 ?04/29 0700 04/29 0701 ?04/30 0700  ? P.O. 420   ? I.V. (mL/kg) 269.6 (2.7)   ? Blood    ? IV Piggyback 387.6   ? Total Intake(mL/kg) 1077.2 (10.6)   ? Urine (mL/kg/hr) 2350 (1)   ? Blood    ? Chest Tube 70   ? Total Output 2420   ? Net -1342.8   ?     ?  ? ? ?_______________________________________________________________ ?Labs: ? ?  Latest Ref Rng & Units 03/07/2022  ?  5:05 AM 03/06/2022  ?  5:00 PM 03/06/2022  ?  5:52 AM  ?CBC  ?WBC 4.0 - 10.5 K/uL 7.8   8.4   6.9    ?Hemoglobin 13.0 - 17.0 g/dL 8.1   8.6   8.9    ?Hematocrit 39.0 - 52.0 % 23.5   26.1   25.1    ?Platelets 150 - 400 K/uL 77   98   88    ? ? ?  Latest Ref Rng & Units 03/07/2022  ?  5:05 AM 03/06/2022  ?  5:00 PM 03/06/2022  ?  5:52 AM  ?CMP  ?Glucose 70 - 99 mg/dL 160   200   142    ?BUN 8 - 23 mg/dL '10   9   9    '$ ?Creatinine 0.61 -  1.24 mg/dL 0.81   0.85   0.77    ?Sodium 135 - 145 mmol/L 135   135   133    ?Potassium 3.5 - 5.1 mmol/L 3.7   4.0   3.8    ?Chloride 98 - 111 mmol/L 104   104   106    ?CO2 22 - 32 mmol/L '25   22   21    '$ ?Calcium 8.9 - 10.3 mg/dL 7.8   7.8   7.5    ? ? ?CXR: ?clear ? ?_______________________________________________________________ ? ?Assessment and Plan: ?POD 2 s/p redo CABG ? ?Neuro: pain controlled ?CV: on A/S/BB.  Increasing BB ?Pulm: pulm hygiene ?Renal: creat stable ?GI: on diet ?Heme: stable ?ID: afebrile ?Endo:  SSI ?Dispo: floor ? ? ?Lucile Crater Sukhdeep Wieting ?03/07/2022 10:25 AM ? ? ?

## 2022-03-08 LAB — BASIC METABOLIC PANEL
Anion gap: 7 (ref 5–15)
BUN: 10 mg/dL (ref 8–23)
CO2: 25 mmol/L (ref 22–32)
Calcium: 8.2 mg/dL — ABNORMAL LOW (ref 8.9–10.3)
Chloride: 107 mmol/L (ref 98–111)
Creatinine, Ser: 0.75 mg/dL (ref 0.61–1.24)
GFR, Estimated: >60 mL/min (ref >60)
Glucose, Bld: 95 mg/dL (ref 70–99)
Potassium: 4.1 mmol/L (ref 3.5–5.1)
Sodium: 139 mmol/L (ref 135–145)

## 2022-03-08 LAB — GLUCOSE, CAPILLARY
Glucose-Capillary: 95 mg/dL (ref 70–99)
Glucose-Capillary: 124 mg/dL — ABNORMAL HIGH (ref 70–99)
Glucose-Capillary: 163 mg/dL — ABNORMAL HIGH (ref 70–99)
Glucose-Capillary: 106 mg/dL — ABNORMAL HIGH (ref 70–99)

## 2022-03-08 LAB — CBC
HCT: 22.9 % — ABNORMAL LOW (ref 39.0–52.0)
Hemoglobin: 7.6 g/dL — ABNORMAL LOW (ref 13.0–17.0)
MCH: 30.5 pg (ref 26.0–34.0)
MCHC: 33.2 g/dL (ref 30.0–36.0)
MCV: 92 fL (ref 80.0–100.0)
Platelets: 70 10*3/uL — ABNORMAL LOW (ref 150–400)
RBC: 2.49 MIL/uL — ABNORMAL LOW (ref 4.22–5.81)
RDW: 13.3 % (ref 11.5–15.5)
WBC: 4.7 10*3/uL (ref 4.0–10.5)
nRBC: 0 % (ref 0.0–0.2)

## 2022-03-08 MED ORDER — FUROSEMIDE 40 MG PO TABS
40.0000 mg | ORAL_TABLET | Freq: Every day | ORAL | Status: DC
Start: 2022-03-08 — End: 2022-03-09
  Administered 2022-03-08 – 2022-03-09 (×2): 40 mg via ORAL
  Filled 2022-03-08 (×2): qty 1

## 2022-03-08 MED ORDER — INSULIN ASPART 100 UNIT/ML IJ SOLN
0.0000 [IU] | Freq: Three times a day (TID) | INTRAMUSCULAR | Status: DC
  Administered 2022-03-08: 4 [IU] via SUBCUTANEOUS
  Administered 2022-03-08: 2 [IU] via SUBCUTANEOUS

## 2022-03-08 NOTE — Progress Notes (Signed)
?   ?  [address] ?      [place] [address] ?            [phone]      ? ?No events ?Ambulating ? ?Vitals:  ? 03/08/22 0323 03/08/22 0743  ?BP: 116/71 140/75  ?Pulse: 84 86  ?Resp: 18 20  ?Temp: 98.6 ?F (37 ?C) 98.1 ?F (36.7 ?C)  ?SpO2: 98% 100%  ? ?Alert NAD ?Sinus ?EWOB ?Incision clean ? ?Labs reviewed ? ?76yo s/p redo CABG ?Doing well ?Continue PT ?Home tomorrow ? ?Miguel Vasquez ? ?

## 2022-03-08 NOTE — Plan of Care (Signed)

## 2022-03-08 NOTE — Progress Notes (Signed)
Patient was assisted to the bathroom. Significant coaching was provided regarding sternal precautions and walking with assistance.  ? ?Patient was reluctant initially, but accepted assistance to the bathroom. Education was provided regarding safety measures.  ? ?Will continue to monitor.  ?

## 2022-03-08 NOTE — Plan of Care (Signed)

## 2022-03-09 LAB — TYPE AND SCREEN
ABO/RH(D): O NEG
Antibody Screen: NEGATIVE
Unit division: 0
Unit division: 0
Unit division: 0

## 2022-03-09 LAB — BPAM RBC
Blood Product Expiration Date: 202305082359
Blood Product Expiration Date: 202305092359
Blood Product Expiration Date: 202305152359
Blood Product Expiration Date: 202305172359
ISSUE DATE / TIME: 202304270855
ISSUE DATE / TIME: 202304270855
ISSUE DATE / TIME: 202304271907
ISSUE DATE / TIME: 202304271937
Unit Type and Rh: 9500
Unit Type and Rh: 9500
Unit Type and Rh: 9500
Unit Type and Rh: 9500

## 2022-03-09 LAB — CBC
HCT: 26.2 % — ABNORMAL LOW (ref 39.0–52.0)
Hemoglobin: 8.8 g/dL — ABNORMAL LOW (ref 13.0–17.0)
MCH: 31.2 pg (ref 26.0–34.0)
MCHC: 33.6 g/dL (ref 30.0–36.0)
MCV: 92.9 fL (ref 80.0–100.0)
Platelets: 121 10*3/uL — ABNORMAL LOW (ref 150–400)
RBC: 2.82 MIL/uL — ABNORMAL LOW (ref 4.22–5.81)
RDW: 13.2 % (ref 11.5–15.5)
WBC: 5.4 10*3/uL (ref 4.0–10.5)
nRBC: 0 % (ref 0.0–0.2)

## 2022-03-09 LAB — BASIC METABOLIC PANEL
Anion gap: 9 (ref 5–15)
BUN: 11 mg/dL (ref 8–23)
CO2: 25 mmol/L (ref 22–32)
Calcium: 8.8 mg/dL — ABNORMAL LOW (ref 8.9–10.3)
Chloride: 103 mmol/L (ref 98–111)
Creatinine, Ser: 0.74 mg/dL (ref 0.61–1.24)
GFR, Estimated: >60 mL/min (ref >60)
Glucose, Bld: 123 mg/dL — ABNORMAL HIGH (ref 70–99)
Potassium: 3.8 mmol/L (ref 3.5–5.1)
Sodium: 137 mmol/L (ref 135–145)

## 2022-03-09 LAB — GLUCOSE, CAPILLARY
Glucose-Capillary: 111 mg/dL — ABNORMAL HIGH (ref 70–99)
Glucose-Capillary: 128 mg/dL — ABNORMAL HIGH (ref 70–99)

## 2022-03-09 LAB — HEPARIN INDUCED PLATELET AB (HIT ANTIBODY): Heparin Induced Plt Ab: 0.155 OD (ref 0.000–0.400)

## 2022-03-09 MED ORDER — METOPROLOL TARTRATE 25 MG PO TABS: 25.0000 mg | ORAL_TABLET | Freq: Two times a day (BID) | ORAL | 2 refills | Status: DC

## 2022-03-09 MED ORDER — RAMIPRIL 5 MG PO CAPS
20.0000 mg | ORAL_CAPSULE | Freq: Every morning | ORAL | Status: DC
  Administered 2022-03-09: 20 mg via ORAL
  Filled 2022-03-09: qty 4

## 2022-03-09 MED ORDER — TRAMADOL HCL 50 MG PO TABS: 50.0000 mg | ORAL_TABLET | Freq: Four times a day (QID) | ORAL | 0 refills | Status: AC | PRN

## 2022-03-09 MED ORDER — ISOSORBIDE MONONITRATE ER 30 MG PO TB24: 30.0000 mg | ORAL_TABLET | Freq: Every day | ORAL | 1 refills | Status: DC

## 2022-03-09 MED ORDER — ACETAMINOPHEN 325 MG PO TABS: 650.0000 mg | ORAL_TABLET | Freq: Four times a day (QID) | ORAL | Status: AC

## 2022-03-09 NOTE — Progress Notes (Signed)
EPWs removed per protocol.  Instructed patient on need for 1Hr of bedrest with VS monitoring.  Will continue to monitor. ?

## 2022-03-09 NOTE — Progress Notes (Signed)
CARDIAC REHAB PHASE I  ? ?D/c education completed with pt and spouse. Pt educated on importance site care and monitoring incisions daily. Encouraged continued IS use, walks, and sternal precautions. Pt given in-the-tube sheet along with heart healthy and diabetic diets. Reviewed site care, restrictions, and exercise guidelines. Will refer to [place]. ? ?[phone] ?Rufina Falco, RN BSN ?03/09/2022 ?1:42 PM ? ?

## 2022-03-09 NOTE — TOC Transition Note (Signed)
Transition of Care (TOC) - CM/SW Discharge Note ?Marvetta Gibbons Therapist, sports, BSN ?Transitions of Care ?Unit 4E- RN Case Manager ?See Treatment Team for direct phone #  ? ? ?Patient Details  ?Name: Miguel Vasquez ?MRN: [id number] ?Date of Birth: 1946-05-16 ? ?Transition of Care (TOC) CM/SW Contact:  ?Dahlia Client, Romeo Rabon, RN ?Phone Number: ?03/09/2022, 12:03 PM ? ? ?Clinical Narrative:    ?Pt stable for transition home today w/ wife s/p CABG redo. Family to transport home.  ?No TOC needs noted.  ? ? ?Final next level of care: Home/Self Care ?Barriers to Discharge: No Barriers Identified ? ? ?Patient Goals and CMS Choice ?  ?  ?Choice offered to / list presented to : NA ? ?Discharge Placement ?  ?           ? Home ?  ?  ?  ? ?Discharge Plan and Services ?  ?  ?Post Acute Care Choice: NA          ?DME Arranged: N/A ?DME Agency: NA ?  ?  ?  ?HH Arranged: NA ?[place] Agency: NA ?  ?  ?  ? ?Social Determinants of Health (SDOH) Interventions ?  ? ? ?Readmission Risk Interventions ? ?  03/09/2022  ? 12:03 PM  ?Readmission Risk Prevention Plan  ?Post Dischage Appt Complete  ?Medication Screening Complete  ?Transportation Screening Complete  ? ? ? ? ? ?

## 2022-03-09 NOTE — Care Management Important Message (Signed)
Important Message ? ?Patient Details  ?Name: Miguel Vasquez ?MRN: [id number] ?Date of Birth: 08-20-1946 ? ? ?Medicare Important Message Given:  Yes ? ? ? ? ?Shelda Altes ?03/09/2022, 9:13 AM ?

## 2022-03-09 NOTE — Discharge Instructions (Signed)

## 2022-03-09 NOTE — Progress Notes (Addendum)
? ?   ?[address] ?      [place] [address] ?            [phone]   ? ?  4 Days Post-Op Procedure(s) (LRB): ?REDO CORONARY ARTERY BYPASS GRAFTING (CABG) X 3 WITH HARVESTED LEFT GREATER SAPHENOUS VEIN AND RIGHT RADIAL ARTERY (N/A) ?TRANSESOPHAGEAL ECHOCARDIOGRAM (TEE) (N/A) ?RADIAL ARTERY HARVEST (Right) ?ENDOVEIN HARVEST OF GREATER SAPHENOUS VEIN (Left) ?Subjective: ?Feels very well ? ?Objective: ?Vital signs in last 24 hours: ?Temp:  [97.9 ?F (36.6 ?C)-98.3 ?F (36.8 ?C)] 98.1 ?F (36.7 ?C) (05/01 0320) ?Pulse Rate:  [76-94] 83 (05/01 0320) ?Cardiac Rhythm: Normal sinus rhythm (04/30 1900) ?Resp:  [16-24] 16 (05/01 0320) ?BP: (105-147)/(64-80) 146/80 (05/01 0320) ?SpO2:  [96 %-100 %] 98 % (05/01 0320) ?Weight:  [98.2 kg] 98.2 kg (05/01 0510) ? ?Hemodynamic parameters for last 24 hours: ?  ? ?Intake/Output from previous day: ?04/30 0701 - 05/01 0700 ?In: 600 [P.O.:600] ?Out: 2476 [Urine:2475; Stool:1] ?Intake/Output this shift: ?No intake/output data recorded. ? ?General appearance: alert, cooperative, and no distress ?Heart: regular rate and rhythm ?Lungs: clear to auscultation bilaterally ?Abdomen: benign ?Extremities: + minor edema ?Wound: incis healing well, + echymosis left thigh, no definate hematoma ?Right hand N/V intact ? ? ?Lab Results: ?Recent Labs  ?  03/07/22 ?0505 03/08/22 ?0322  ?WBC 7.8 4.7  ?HGB 8.1* 7.6*  ?HCT 23.5* 22.9*  ?PLT 77* 70*  ? ?BMET:  ?Recent Labs  ?  03/07/22 ?0505 03/08/22 ?0322  ?NA 135 139  ?K 3.7 4.1  ?CL 104 107  ?CO2 25 25  ?GLUCOSE 160* 95  ?BUN 10 10  ?CREATININE 0.81 0.75  ?CALCIUM 7.8* 8.2*  ?  ?PT/INR: No results for input(s): LABPROT, INR in the last 72 hours. ?ABG ?   ?Component Value Date/Time  ? PHART 7.393 03/05/2022 2359  ? HCO3 22.5 03/05/2022 2359  ? TCO2 24 03/05/2022 2359  ? ACIDBASEDEF 2.0 03/05/2022 2359  ? O2SAT 99 03/05/2022 2359  ? ?CBG (last 3)  ?Recent Labs  ?  03/08/22 ?1635 03/08/22 ?2114 03/09/22 ?0618  ?GLUCAP 163* 106* 111*   ? ? ?Meds ?Scheduled Meds: ? acetaminophen  1,000 mg Oral Q6H  ? Or  ? acetaminophen (TYLENOL) oral liquid 160 mg/5 mL  1,000 mg Per Tube Q6H  ? aspirin EC  325 mg Oral Daily  ? Or  ? aspirin  324 mg Per Tube Daily  ? bisacodyl  10 mg Oral Daily  ? Or  ? bisacodyl  10 mg Rectal Daily  ? chlorhexidine gluconate (MEDLINE KIT)  15 mL Mouth Rinse BID  ? Chlorhexidine Gluconate Cloth  6 each Topical Daily  ? Chlorhexidine Gluconate Cloth  6 each Topical Daily  ? docusate sodium  200 mg Oral Daily  ? ezetimibe  10 mg Oral Daily  ? folic acid  998 mcg Oral Daily  ? furosemide  40 mg Oral Daily  ? glipiZIDE  5 mg Oral Q breakfast  ? insulin aspart  0-24 Units Subcutaneous TID WC  ? insulin detemir  25 Units Subcutaneous BID  ? isosorbide mononitrate  30 mg Oral Daily  ? levothyroxine  100 mcg Oral Daily  ? metFORMIN  1,000 mg Oral BID WC  ? metoprolol tartrate  25 mg Oral BID  ? pantoprazole  40 mg Oral Daily  ? rosuvastatin  10 mg Oral q AM  ? ?Continuous Infusions: ?PRN Meds:.dextrose, metoprolol tartrate, ondansetron (ZOFRAN) IV, oxyCODONE, traMADol ? ?Xrays ?No results found. ? ?  Assessment/Plan: ?S/P Procedure(s) (LRB): ?REDO CORONARY ARTERY BYPASS GRAFTING (CABG) X 3 WITH HARVESTED LEFT GREATER SAPHENOUS VEIN AND RIGHT RADIAL ARTERY (N/A) ?TRANSESOPHAGEAL ECHOCARDIOGRAM (TEE) (N/A) ?RADIAL ARTERY HARVEST (Right) ?ENDOVEIN HARVEST OF GREATER SAPHENOUS VEIN (Left) ?POD#4 ? ?1 afeb , VSS s BP 100's-140's, couple episodes of supravent tachy to 120's this am, mostly sinus rhythm ?2 sats good on RA ?3 weight about 2 kg>preop, cont lasix for now ?4 no new labs or XRAYs ?5 will d/c pacer wires this am ?6 recheck H/H as is borderline for transfusion, he got tachy getting up so may be symptopmatic anemia- may need to titrate beta blocker, on Imdur for radial. ?7 CBG -ok but wasn't on insulin at home , back on metformin ?8 cont rehab and pulm hygiene ? ? LOS: 4 days  ? ? ?John Giovanni PA-C ?Pager [phone] ?03/09/2022 ?   ?Patient seen and examined, agree with above ?Will check Hgb, transfuse if Hgb < 7 ?Possibly home later today if Hgb Ok ? ?Revonda Standard Roxan Hockey, MD ?[facility] ?[phone] ? ?

## 2022-03-09 NOTE — Plan of Care (Signed)
DISCHARGE NOTE HOME ?Miguel Vasquez to be discharged home per MD order. Discussed prescriptions and follow up appointments with the patient. Medication list explained in detail. Patient verbalized understanding. ? ?Skin clean, dry and intact without evidence of skin break down. IV catheter discontinued intact. Site without signs and symptoms of complications. Dressing and pressure applied. Pt denies pain at the site currently. No complaints noted. ? ?Patient free of lines and drains.  ? ?An After Visit Summary (AVS) was printed and given to the patient. ?Patient escorted via wheelchair, and discharged home via private auto. ? ?Stephan Minister, RN  ?

## 2022-03-09 NOTE — Addendum Note (Signed)
Addendum  created 03/09/22 1225 by Josephine Igo, CRNA  ? Order list changed, Pharmacy for encounter modified  ?  ?

## 2022-03-09 NOTE — Progress Notes (Signed)
CARDIAC REHAB PHASE I  ? ?PRE:  Rate/Rhythm: 92 SR ? ?BP:  Sitting: 135/73     ? ?SaO2: 96 RA ? ?MODE:  Ambulation: 370 ft X2  ? ?POST:  Rate/Rhythm: 108 ST ? ?BP:  Sitting: 177/86 ? ?  SaO2: 92 RA ? ? ?Pt eager to ambulated 32f in hallway, stopped in BR, than ambulated another 3745fin hallway independently with steady gait. Pt denies CP, SOB, or dizziness throughout. Pt gown changed. Encouraged continued ambulation. Possible d/c later today, will f/u later. ? ?[id number] FalcoRN BSN ?03/09/2022 ?9:48 AM ? ?

## 2022-03-09 NOTE — Progress Notes (Signed)
Patient was reluctant to accept assistance while ambulating to the restroom. Significant education/coaching was provided regarding safety measures.  ? ?Patient was assisted to the bathroom and left alone per his request. Emergency call bell within reach.  ? ? ?

## 2022-03-10 ENCOUNTER — Telehealth (HOSPITAL_COMMUNITY): Payer: Self-pay

## 2022-03-10 NOTE — Telephone Encounter (Signed)
Per phase I cardiac rehab, fax cardiac rehab referral to [facility] cardiac rehab. ?

## 2022-03-12 MED FILL — Electrolyte-R (PH 7.4) Solution: INTRAVENOUS | Qty: 5000 | Status: AC

## 2022-03-12 MED FILL — Mannitol IV Soln 20%: INTRAVENOUS | Qty: 500 | Status: AC

## 2022-03-12 MED FILL — Heparin Sodium (Porcine) Inj 1000 Unit/ML: INTRAMUSCULAR | Qty: 20 | Status: AC

## 2022-03-12 MED FILL — Sodium Chloride IV Soln 0.9%: INTRAVENOUS | Qty: 2000 | Status: AC

## 2022-03-12 MED FILL — Lidocaine HCl (Cardiac) IV PF Soln 100 MG/5ML (2%): INTRAVENOUS | Qty: 5 | Status: AC

## 2022-03-12 MED FILL — Sodium Bicarbonate IV Soln 8.4%: INTRAVENOUS | Qty: 50 | Status: AC

## 2022-03-12 MED FILL — Albumin, Human Inj 5%: INTRAVENOUS | Qty: 250 | Status: AC

## 2022-03-18 ENCOUNTER — Ambulatory Visit (INDEPENDENT_AMBULATORY_CARE_PROVIDER_SITE_OTHER): Payer: Self-pay

## 2022-03-18 DIAGNOSIS — Z4802 Encounter for removal of sutures: Secondary | ICD-10-CM

## 2022-03-27 NOTE — Progress Notes (Signed)
Cardiology Office Note    Date:  04/03/2022  ID:  Miguel Vasquez, DOB 1946-07-09, MRN [id number] PCP:  Miguel Box, MD  Cardiologist:  Miguel Zalar Martinique MD  Chief Complaint: f/u CAD  History of Present Illness:  Miguel Vasquez is a 76 y.o. male seen for evaluation of DOE and some chest pain. He has a history of CAD (stent to Cx 1996, stent to RCA 2002, s/p CABGx3 in 2015, rotational atherectomy/DESx2 to RCA 05/2017), HLD (intolerant of statins due to elevated liver/muscle enzymes), HTN, DM, hemochromatosis, cervical disc disease, panic disorder, hypothyroidism, h/o marked sinus bradycardia (BB stopped)   Last 2D Echo 09/2014: mild-mod LVH, EF 55-60%, cannot exclude RWMA, mild AI. This past summer he was complaining of atypical chest pain but this was similar to what prompted CABG. Nuc was abnormal so he underwent diagnostic cath 05/11/16 showing severe, diffuse calcific disease and a very large dominant RCA - essentially, chronic occlusion at the ostium. There were brisk left to right collaterals. He had occluded SVG-OM. 50% mLAD, patent LIMA-LAD. LVEF and LVEDP were normal. He was brought back for staged PCI with successful rotational atherectomy and stenting of the ostial to mid RCA with DES x 2. DAPT was recommended indefinitely.  We had also checked an echo 2/77/41 for systolic murmur showing mod LVH, EF 60-65%, grade 1 DD, mild AI.  He was seen in February and doing OK at that time but fairly inactive. 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 Some tightness. No pain. Did not have typical angina before. He was concerned. No pain or SOB now. He is generally pretty sedentary. He has been taking his Crestor daily for the last 1-2 months.  He did undergo cardiac cath  on 02/06/22. This demonstrated occlusion of prior bypass grafts including LIMA. Severe 3 vessel disease. EF is preserved. He was seen by Dr Miguel Vasquez on 02/19/22 for consideration  of redo CABG. This was performed on 03/05/22 with right radial graft to the LAD, SVG to OM and SVG to PL branches. His post op course was uncomplicated and he was DC.   On follow up today he is doing well. Incisions are healing well. He denies any chest pain or SOB. No palpitations. No swelling. Feels well. He has been walking around the local school.    Past Medical History:  Diagnosis Date   Aortic insufficiency    a. Mild AI by echo 04/2016.   Basal cell carcinoma, face    Coronary disease    a. stent to Cx 1996. b. stent to RCA 2002. c. s/p CABGx3 in 2015. d.  rotational atherectomy and DESx2 to ostial to mid RCA.   Elevated liver enzymes    GERD (gastroesophageal reflux disease)    H. pylori infection tx'd in the 1970s   Hemochromatosis    History of gout 1990s X 1   Hypercholesterolemia    Hypertension    Controlled   Hypothyroid    Myocardial infarction [facility]) 2002   "mild"   Obesity    Panic disorder    Hx of panic disorder   S/P primary angioplasty with coronary stent and rotational atherectomy of ostial mRCA with DES X 2-- 7/11/7 05/20/2016   Sinus bradycardia    a. h/o marked sinus bradycardia, BB stopped.   Tachycardia    a. Noted on stress test strips in 2017. ? Atrial flutter. Further w/u underway.   Thrombocytopenia ([place])  a. per review of labs.   Type II diabetes mellitus [facility])     Past Surgical History:  Procedure Laterality Date   BASAL CELL CARCINOMA EXCISION     "face & lips"   CARDIAC CATHETERIZATION  11//2015   CARDIAC CATHETERIZATION N/A 05/11/2016   Procedure: Left Heart Cath and Cors/Grafts Angiography;  Surgeon: Miguel Booze, MD;  Location: [place] CV LAB;  Service: Cardiovascular;  Laterality: N/A;   CARDIAC CATHETERIZATION N/A 05/19/2016   Procedure: Coronary/Graft Atherectomy;  Surgeon: Miguel Lingenfelter M Martinique, MD;  Location: [place] CV LAB;  Service: Cardiovascular;  Laterality: N/A;   CARDIAC CATHETERIZATION N/A 05/19/2016   Procedure:  Coronary Stent Intervention;  Surgeon: Miguel Shock M Martinique, MD;  Location: [place] CV LAB;  Service: Cardiovascular;  Laterality: N/A;   CATARACT EXTRACTION W/ INTRAOCULAR LENS  IMPLANT, BILATERAL Bilateral 2016   CORONARY ANGIOPLASTY WITH STENT PLACEMENT  1996; 2002; 05/19/2016   "1 + 2 +2"   CORONARY ARTERY BYPASS GRAFT N/A 10/03/2014   Procedure: CORONARY ARTERY BYPASS GRAFTING (CABG) x  three, using left internal mammary artery and right leg greater saphenous vein harvested endoscopically;  Surgeon: Miguel Nakayama, MD;  Location: [place];  Service: Open Heart Surgery;  Laterality: N/A;   CORONARY ARTERY BYPASS GRAFT N/A 03/05/2022   Procedure: REDO CORONARY ARTERY BYPASS GRAFTING (CABG) X 3 WITH HARVESTED LEFT GREATER SAPHENOUS VEIN AND RIGHT RADIAL ARTERY;  Surgeon: Miguel Nakayama, MD;  Location: [place];  Service: Open Heart Surgery;  Laterality: N/A;   ENDOVEIN HARVEST OF GREATER SAPHENOUS VEIN Left 03/05/2022   Procedure: ENDOVEIN HARVEST OF GREATER SAPHENOUS VEIN;  Surgeon: Miguel Nakayama, MD;  Location: [place];  Service: Open Heart Surgery;  Laterality: Left;   LEFT HEART CATH AND CORS/GRAFTS ANGIOGRAPHY N/A 02/06/2022   Procedure: LEFT HEART CATH AND CORS/GRAFTS ANGIOGRAPHY;  Surgeon: Vasquez, Miguel Brosious M, MD;  Location: [place] CV LAB;  Service: Cardiovascular;  Laterality: N/A;   LEFT HEART CATHETERIZATION WITH CORONARY ANGIOGRAM N/A 09/28/2014   Procedure: LEFT HEART CATHETERIZATION WITH CORONARY ANGIOGRAM;  Surgeon: Miguel Sine, MD;  Location: [facility] CATH LAB;  Service: Cardiovascular;  Laterality: N/A;   RADIAL ARTERY HARVEST Right 03/05/2022   Procedure: RADIAL ARTERY HARVEST;  Surgeon: Miguel Nakayama, MD;  Location: [place];  Service: Open Heart Surgery;  Laterality: Right;   TEE WITHOUT CARDIOVERSION N/A 10/03/2014   Procedure: TRANSESOPHAGEAL ECHOCARDIOGRAM (TEE);  Surgeon: Miguel Nakayama, MD;  Location: [place];  Service: Open Heart Surgery;  Laterality: N/A;    TEE WITHOUT CARDIOVERSION N/A 03/05/2022   Procedure: TRANSESOPHAGEAL ECHOCARDIOGRAM (TEE);  Surgeon: Miguel Nakayama, MD;  Location: [place];  Service: Open Heart Surgery;  Laterality: N/A;   TONSILLECTOMY  1950s   TRIGGER FINGER RELEASE Bilateral 1990s-2000s    Current Medications: Current Outpatient Medications  Medication Sig Dispense Refill   acetaminophen (TYLENOL) 325 MG tablet Take 2 tablets (650 mg total) by mouth every 6 (six) hours.     allopurinol (ZYLOPRIM) 300 MG tablet Take 150 mg by mouth in the morning.     aspirin 81 MG EC tablet Take 81 mg by mouth every evening.     Cholecalciferol (VITAMIN D3) 25 MCG (1000 UT) CAPS Take 1,000 Units by mouth every evening.     ezetimibe (ZETIA) 10 MG tablet Take 10 mg by mouth daily.       folic acid (FOLVITE) 408 MCG tablet Take 400 mcg by mouth in the morning.  glipiZIDE (GLUCOTROL XL) 5 MG 24 hr tablet Take 5 mg by mouth daily with breakfast.     isosorbide mononitrate (IMDUR) 30 MG 24 hr tablet Take 1 tablet (30 mg total) by mouth daily. 30 tablet 1   levothyroxine (SYNTHROID, LEVOTHROID) 100 MCG tablet Take 100 mcg by mouth daily.       metFORMIN (GLUCOPHAGE) 500 MG tablet Take 2 tablets (1,000 mg total) by mouth in the morning and at bedtime. Take 2 tablets by mouth 2 times daily with meal.     metoprolol tartrate (LOPRESSOR) 25 MG tablet Take 1 tablet (25 mg total) by mouth 2 (two) times daily. 60 tablet 2   nitroGLYCERIN (NITROSTAT) 0.4 MG SL tablet Place 0.4 mg under the tongue every 5 (five) minutes as needed for chest pain. DISSOLVE ONE TABLET UNDER THE TONGUE EVERY 5 MINUTES AS NEEDED FOR CHEST PAIN.  DO NOT EXCEED A TOTAL OF 3 DOSES IN 15 MINUTES     omega-3 acid ethyl esters (LOVAZA) 1 g capsule Take 2 g by mouth 2 (two) times daily.     pantoprazole (PROTONIX) 40 MG tablet Take 1 tablet (40 mg total) by mouth daily. (Patient taking differently: Take 40 mg by mouth every evening.) 30 tablet 11   ramipril (ALTACE) 10 MG  capsule Take 20 mg by mouth in the morning.     rosuvastatin (CRESTOR) 10 MG tablet Take 10 mg by mouth in the morning.     traMADol (ULTRAM) 50 MG tablet Take 50 mg by mouth every 6 (six) hours as needed.     No current facility-administered medications for this visit.     Allergies:   Statins   Social History   Socioeconomic History   Marital status: Married    Spouse name: Not on file   Number of children: 1   Years of education: Not on file   Highest education level: Not on file  Occupational History   Occupation: Agricultural consultant [employer]  Tobacco Use   Smoking status: Former    Packs/day: 1.00    Years: 20.00    Pack years: 20.00    Types: Cigarettes    Start date: 09/20/1995    Quit date: 09/24/1995    Years since quitting: 26.5   Smokeless tobacco: Never  Vaping Use   Vaping Use: Never used  Substance and Sexual Activity   Alcohol use: Yes    Alcohol/week: 6.0 standard drinks    Types: 6 Cans of beer per week   Drug use: No   Sexual activity: Yes  Other Topics Concern   Not on file  Social History Narrative   Not on file   Social Determinants of Health   Financial Resource Strain: Not on file  Food Insecurity: Not on file  Transportation Needs: Not on file  Physical Activity: Not on file  Stress: Not on file  Social Connections: Not on file     Family History:  The patient's family history includes Colon cancer in his brother and mother; GI problems in his father; Heart attack in his sister; Heart failure in his mother.   ROS:   Please see the history of present illness.   All other systems are reviewed and otherwise negative.    PHYSICAL EXAM:   VS:  BP 128/74 (BP Location: Left Arm, Patient Position: Sitting, Cuff Size: Normal)   Pulse 79   Ht '6\' 1"'$  (1.854 m)   Wt 208 lb (94.3 kg)   BMI 27.44 kg/m  BMI: Body mass index is 27.44 kg/m. GENERAL:  Well appearing WM in NAD HEENT:  PERRL, EOMI, sclera are clear. Oropharynx is clear. NECK:  No jugular  venous distention, carotid upstroke brisk and symmetric, no bruits, no thyromegaly or adenopathy LUNGS:  Clear to auscultation bilaterally CHEST:  Unremarkable HEART:  RRR,  PMI not displaced or sustained,S1 and S2 within normal limits, no S3, no S4: no clicks, no rubs, no murmurs ABD:  Soft, nontender. BS +, no masses or bruits. No hepatomegaly, no splenomegaly EXT:  2 + pulses throughout, no edema, no cyanosis no clubbing SKIN:  Warm and dry.  No rashes NEURO:  Alert and oriented x 3. Cranial nerves II through XII intact. PSYCH:  Cognitively intact   Wt Readings from Last 3 Encounters:  04/03/22 208 lb (94.3 kg)  03/09/22 216 lb 9.6 oz (98.2 kg)  03/03/22 211 lb 3.2 oz (95.8 kg)      Studies/Labs Reviewed:   EKG:   EKG is ordered today. NSR rate 79, nonspecific TWA. I have personally reviewed and interpreted this study.  Recent Labs: 03/03/2022: ALT 48 03/06/2022: Magnesium 2.4 03/09/2022: BUN 11; Creatinine, Ser 0.74; Hemoglobin 8.8; Platelets 121; Potassium 3.8; Sodium 137   Lipid Panel    Component Value Date/Time   CHOL 130 02/04/2022 1104   TRIG 146 02/04/2022 1104   HDL 53 02/04/2022 1104   CHOLHDL 2.5 02/04/2022 1104   CHOLHDL 5.0 05/07/2016 0736   VLDL 44 (H) 05/07/2016 0736   LDLCALC 52 02/04/2022 1104    Additional studies/ records that were reviewed today include:  Labs dated 02/16/17: cholesterol 266, triglycerides 253, HDL 46, LDL 169. CBC normal except platelets 129K.  Dated 02/24/17: A1c 7.3%. Iron studies and TFTs normal. AST 90, ALT 128. Other chemistries are normal.  Dated 10/12/17: A1c 6%. Hgb normal. Potassium 5.2. AST 60. TSH normal. Triglycerides 98, HDL 58, LDL 161. Dated 11/29/18: potassium 5.1. creatinine 0.8. glucose 181. AST 97, ALT 108. Cholesterol 252, triglycerides 290, HDL 41, LDL 153. plts 143K. Otherwise CBC normal. Dated 08/14/20: cholesterol 188, triglycerides 163, HDL 46, LDL 113. AST 74, ALT 79. TSH normal. Platelets 138K. Glucose 143, A1c  7.2%. Dated 08/05/21: cholesterol 186, triglycerides 211, HDL 44, LDL 100. Plts 111K otherwise CBC normal. A1c 6.9%. sodium 135. AST and ALT 86. Glucose 170, TSH normal.  Cardiac cath 02/06/22:  LEFT HEART CATH AND CORS/GRAFTS ANGIOGRAPHY   Conclusion      Mid LAD lesion is 90% stenosed.   Prox Cx to Mid Cx lesion is 90% stenosed.   1st Mrg lesion is 90% stenosed.   Dist RCA lesion is 99% stenosed.   Ost RCA to Mid RCA lesion is 20% stenosed.   Origin lesion is 100% stenosed.   Origin to Prox Graft lesion is 100% stenosed.   Origin to Prox Graft lesion is 100% stenosed.   The left ventricular systolic function is normal.   LV end diastolic pressure is mildly elevated.   The left ventricular ejection fraction is 55-65% by visual estimate.   Severe 3 vessel obstructive CAD. There is significant progression of disease in the distal RCA and LCx at OM1 and OM2 bifurcation.  LIMA is now occluded. Known occlusion of SVGs Stents in proximal RCA are patent Good LV function Mildly elevated LVEDP   Plan; significant progression of disease since 2017 and now occluded LIMA to the LAD. Recommend CT surgery consult to consider whether he is a candidate for redo bypass. If not would at  least do atherectomy and stenting of the LAD. Further PCI of the distal RCA would be very difficult since the vessel is hard to engage with prior stent at the ostium. The LCx has bifurcation disease.     Echo 02/06/22: IMPRESSIONS     1. Left ventricular ejection fraction, by estimation, is 55 to 60%. The  left ventricle has normal function. The left ventricle has no regional  wall motion abnormalities. There is mild concentric left ventricular  hypertrophy. Left ventricular diastolic  parameters are indeterminate.   2. Right ventricular systolic function is normal. The right ventricular  size is normal. Tricuspid regurgitation signal is inadequate for assessing  PA pressure.   3. The mitral valve is normal in  structure. No evidence of mitral valve  regurgitation. No evidence of mitral stenosis.   4. The aortic valve is grossly normal. There is mild calcification of the  aortic valve. There is mild thickening of the aortic valve. Aortic valve  regurgitation is mild. No aortic stenosis is present.   5. The inferior vena cava is normal in size with greater than 50%  respiratory variability, suggesting right atrial pressure of 3 mmHg.   6. Cannot exclude a small PFO.   Comparison(s): No prior Echocardiogram.   ASSESSMENT & PLAN:   CAD s/p CABG 2015. S/p complex rotational atherectomy and stenting of the RCA in July 2017 (failed SVG to RCA).  Now with symptoms mostly of DOE that sounds like anginal equivalent.  Not on BB due to h/o marked sinus bradycardia. Cardiac cath showed occlusion of all prior bypass grafts and 3 vessel disease. EF is normal without significant valvular disease ( mild AI). Now s/p redo CABG on 03/05/22 with right radial to LAD, SVG to OM, SVG to PL. Making a good recovery. We discussed the importance of eating right to take care of these grafts.  Mild AI - no change on recent Echo.  HTN - well controlled.  Hyperlipidemia with chronicaly elevated LFTs - US shows a fatty liver.  On Zetia. Now on Crestor daily. Last LDL 52.  Thrombocytopenia - no bleeding DM type 2. Last A1c was 6.9%. Now on glipizide and metformin. Would consider adding an SGLT 2 inhibitor. Needs to follow up with PCP.     Signed, Muriah Harsha Martinique MD, [facility]  04/03/2022 12:49 PM    [facility]

## 2022-04-02 ENCOUNTER — Other Ambulatory Visit: Payer: Self-pay | Admitting: Thoracic Surgery (Cardiothoracic Vascular Surgery)

## 2022-04-02 DIAGNOSIS — Z951 Presence of aortocoronary bypass graft: Secondary | ICD-10-CM

## 2022-04-03 ENCOUNTER — Ambulatory Visit: Payer: Medicare PPO | Admitting: Cardiology

## 2022-04-03 VITALS — BP 128/74 | HR 79 | Ht 73.0 in | Wt 208.0 lb

## 2022-04-03 DIAGNOSIS — Z951 Presence of aortocoronary bypass graft: Secondary | ICD-10-CM

## 2022-04-03 DIAGNOSIS — I25708 Atherosclerosis of coronary artery bypass graft(s), unspecified, with other forms of angina pectoris: Secondary | ICD-10-CM | POA: Diagnosis not present

## 2022-04-03 DIAGNOSIS — E7800 Pure hypercholesterolemia, unspecified: Secondary | ICD-10-CM | POA: Diagnosis not present

## 2022-04-03 DIAGNOSIS — I10 Essential (primary) hypertension: Secondary | ICD-10-CM

## 2022-04-03 DIAGNOSIS — E119 Type 2 diabetes mellitus without complications: Secondary | ICD-10-CM | POA: Diagnosis not present

## 2022-04-03 NOTE — Patient Instructions (Signed)
Medication Instructions:  Your physician recommends that you continue on your current medications as directed. Please refer to the Current Medication list given to you today.  *If you need a refill on your cardiac medications before your next appointment, please call your pharmacy*   Follow-Up: At [facility], you and your health needs are our priority.  As part of our continuing mission to provide you with exceptional heart care, we have created designated Provider Care Teams.  These Care Teams include your primary Cardiologist (physician) and Advanced Practice Providers (APPs -  Physician Assistants and Nurse Practitioners) who all work together to provide you with the care you need, when you need it.  We recommend signing up for the patient portal called "MyChart".  Sign up information is provided on this After Visit Summary.  MyChart is used to connect with patients for Virtual Visits (Telemedicine).  Patients are able to view lab/test results, encounter notes, upcoming appointments, etc.  Non-urgent messages can be sent to your provider as well.   To learn more about what you can do with MyChart, go to [email or web address].    Your next appointment:   4 month(s)  The format for your next appointment:   In Person  Provider:   Peter Martinique, MD

## 2022-04-07 ENCOUNTER — Ambulatory Visit
Admission: RE | Admit: 2022-04-07 | Discharge: 2022-04-07 | Disposition: A | Discharge status: Home / Self Care | Payer: Medicare PPO | Source: Ambulatory Visit | Attending: Thoracic Surgery (Cardiothoracic Vascular Surgery) | Admitting: Thoracic Surgery (Cardiothoracic Vascular Surgery)

## 2022-04-07 ENCOUNTER — Ambulatory Visit (INDEPENDENT_AMBULATORY_CARE_PROVIDER_SITE_OTHER): Payer: Self-pay | Admitting: Thoracic Surgery (Cardiothoracic Vascular Surgery)

## 2022-04-07 ENCOUNTER — Encounter: Payer: Self-pay | Admitting: Thoracic Surgery (Cardiothoracic Vascular Surgery)

## 2022-04-07 VITALS — BP 138/76 | HR 74 | Resp 20 | Ht 73.0 in | Wt 206.0 lb

## 2022-04-07 DIAGNOSIS — Z951 Presence of aortocoronary bypass graft: Secondary | ICD-10-CM

## 2022-04-07 MED ORDER — CLOPIDOGREL BISULFATE 75 MG PO TABS: 75.0000 mg | ORAL_TABLET | Freq: Every day | ORAL | 6 refills | Status: DC

## 2022-04-07 NOTE — Progress Notes (Signed)
[address]       [place] [address]             [phone]      HPI: Mr. Erman returns for a scheduled follow-up visit after recent redo coronary bypass grafting  Miguel Vasquez is a 76 year old man with a past history of CAD, multiple PCI, CABG, hypertension, hyperlipidemia, type 2 diabetes, mild aortic insufficiency, panic disorder, hypothyroidism, gout, and thrombocytopenia.  He had coronary bypass grafting x3 in 2015.  He recently presented with shortness of breath and chest tightness and was found to have severe three-vessel disease in his native coronaries and all of his grafts were occluded.  I did a redo coronary bypass grafting x3 using a right radial and vein from the left leg on 03/05/2022.  His postoperative course was unremarkable and he went home on day 4.  His coronary targets were only fair quality.  Currently he is feeling well.  He is anxious to increase his activities.  He does have some incisional soreness but is not taking any narcotics.  Also has some numbness in the snuffbox area of his right hand and wrist.  Numbness of the thumb has improved.  Past Medical History:  Diagnosis Date   Aortic insufficiency    a. Mild AI by echo 04/2016.   Basal cell carcinoma, face    Coronary disease    a. stent to Cx 1996. b. stent to RCA 2002. c. s/p CABGx3 in 2015. d.  rotational atherectomy and DESx2 to ostial to mid RCA.   Elevated liver enzymes    GERD (gastroesophageal reflux disease)    H. pylori infection tx'd in the 1970s   Hemochromatosis    History of gout 1990s X 1   Hypercholesterolemia    Hypertension    Controlled   Hypothyroid    Myocardial infarction [facility]) 2002   "mild"   Obesity    Panic disorder    Hx of panic disorder   S/P primary angioplasty with coronary stent and rotational atherectomy of ostial mRCA with DES X 2-- 7/11/7 05/20/2016   Sinus bradycardia    a. h/o marked sinus bradycardia, BB stopped.   Tachycardia    a.  Noted on stress test strips in 2017. ? Atrial flutter. Further w/u underway.   Thrombocytopenia ([place])    a. per review of labs.   Type II diabetes mellitus (HCC)     Current Outpatient Medications  Medication Sig Dispense Refill   acetaminophen (TYLENOL) 325 MG tablet Take 2 tablets (650 mg total) by mouth every 6 (six) hours.     allopurinol (ZYLOPRIM) 300 MG tablet Take 150 mg by mouth in the morning.     aspirin 81 MG EC tablet Take 81 mg by mouth every evening.     Cholecalciferol (VITAMIN D3) 25 MCG (1000 UT) CAPS Take 1,000 Units by mouth every evening.     clopidogrel (PLAVIX) 75 MG tablet Take 1 tablet (75 mg total) by mouth daily. 30 tablet 6   ezetimibe (ZETIA) 10 MG tablet Take 10 mg by mouth daily.       folic acid (FOLVITE) 101 MCG tablet Take 400 mcg by mouth in the morning.     glipiZIDE (GLUCOTROL XL) 5 MG 24 hr tablet Take 5 mg by mouth daily with breakfast.     isosorbide mononitrate (IMDUR) 30 MG 24 hr tablet Take 1 tablet (30 mg total) by mouth daily. 30 tablet 1  levothyroxine (SYNTHROID, LEVOTHROID) 100 MCG tablet Take 100 mcg by mouth daily.       metFORMIN (GLUCOPHAGE) 500 MG tablet Take 2 tablets (1,000 mg total) by mouth in the morning and at bedtime. Take 2 tablets by mouth 2 times daily with meal.     metoprolol tartrate (LOPRESSOR) 25 MG tablet Take 1 tablet (25 mg total) by mouth 2 (two) times daily. 60 tablet 2   nitroGLYCERIN (NITROSTAT) 0.4 MG SL tablet Place 0.4 mg under the tongue every 5 (five) minutes as needed for chest pain. DISSOLVE ONE TABLET UNDER THE TONGUE EVERY 5 MINUTES AS NEEDED FOR CHEST PAIN.  DO NOT EXCEED A TOTAL OF 3 DOSES IN 15 MINUTES     omega-3 acid ethyl esters (LOVAZA) 1 g capsule Take 2 g by mouth 2 (two) times daily.     pantoprazole (PROTONIX) 40 MG tablet Take 1 tablet (40 mg total) by mouth daily. (Patient taking differently: Take 40 mg by mouth every evening.) 30 tablet 11   ramipril (ALTACE) 10 MG capsule Take 20 mg by mouth in  the morning.     rosuvastatin (CRESTOR) 10 MG tablet Take 10 mg by mouth in the morning.     traMADol (ULTRAM) 50 MG tablet Take 50 mg by mouth every 6 (six) hours as needed.     No current facility-administered medications for this visit.    Physical Exam BP 138/76 (BP Location: Right Arm, Patient Position: Sitting)   Pulse 74   Resp 20   Ht '6\' 1"'$  (1.854 m)   Wt 206 lb (93.4 kg)   SpO2 96% Comment: ra  BMI 27.25 kg/m  76 year old man in no acute distress Well-developed and well-nourished Alert and oriented x3 with no motor deficits, decreased sensation to touch at base of right thumb Sternal incision well-healed, sternum stable Right arm and left leg incisions well-healed Cardiac regular rate and rhythm Lungs clear with equal breath sounds bilaterally No peripheral edema   Diagnostic Tests: I personally reviewed the chest x-ray.  It shows no active disease.  Impression: Miguel Vasquez is a 76 year old man presents with known coronary disease who presented with recurrent angina and was found to have severe native three-vessel disease with all 3 of his previous bypass grafts from 2015 occluded.  He underwent redo coronary bypass grafting on 03/05/2022.  He is now about a month out from surgery and is doing well.  He has minimal discomfort.  He does have some numbness on the dorsum of the right thumb and hand from the radial harvest but that is improving.  His exercise tolerance is good.  He is anxious to increase his activities.  He may drive, appropriate precautions were discussed.  He should not lift anything over 10 pounds or try to use his lawn mower for at least 2 more weeks.  After that he can gradually increase his activities as tolerated.  He has almost completed his course of Imdur.  He will stop that medication after the current prescription is finished.  It is reasonable for him to put him back on Plavix he is on a baby aspirin but addition of Plavix well heard in terms  of graft patency.  He is on a statin with Crestor.   Plan: Plavix 75 mg daily Stop Imdur after current prescription complete Continue to follow-up with Dr. Martinique I will be happy to see Mr. Hudnall back anytime in the future if I can be of any further assistance with his care  Melrose Nakayama, MD [facility] [phone]

## 2022-04-11 ENCOUNTER — Ambulatory Visit: Payer: Medicare PPO | Admitting: Cardiology

## 2022-05-26 ENCOUNTER — Telehealth: Payer: Self-pay | Admitting: *Deleted

## 2022-05-26 NOTE — Telephone Encounter (Signed)
Patient contacted the office asking when his lifting restrictions are lifted. Advised patient all activity restrictions are lifted 12 weeks post, 7/20. Patient with questions regarding metoprolol and refills. Advised patient to contact Cardiologist. Patient also states he is still experiencing numbness in right hand, esp in finger tips. Advised patient the numbness make take some time to heal. Advised patient to contact PCP if numbness continues for further treatment and management. Patient verbalized understanding.

## 2022-06-03 NOTE — Progress Notes (Signed)
Cardiology Office Note    Date:  06/09/2022  ID:  Miguel Vasquez, DOB May 15, 1946, MRN [id number] PCP:  Audie Box, MD  Cardiologist:  Lannette Avellino Martinique MD  Chief Complaint: f/u CAD  History of Present Illness:  Miguel Vasquez is a 76 y.o. male seen for evaluation of DOE and some chest pain. He has a history of CAD (stent to Cx 1996, stent to RCA 2002, s/p CABGx3 in 2015, rotational atherectomy/DESx2 to RCA 05/2017), HLD (intolerant of statins due to elevated liver/muscle enzymes), HTN, DM, hemochromatosis, cervical disc disease, panic disorder, hypothyroidism, h/o marked sinus bradycardia (BB stopped)   Last 2D Echo 09/2014: mild-mod LVH, EF 55-60%, cannot exclude RWMA, mild AI. This past summer he was complaining of atypical chest pain but this was similar to what prompted CABG. Nuc was abnormal so he underwent diagnostic cath 05/11/16 showing severe, diffuse calcific disease and a very large dominant RCA - essentially, chronic occlusion at the ostium. There were brisk left to right collaterals. He had occluded SVG-OM. 50% mLAD, patent LIMA-LAD. LVEF and LVEDP were normal. He was brought back for staged PCI with successful rotational atherectomy and stenting of the ostial to mid RCA with DES x 2. DAPT was recommended indefinitely.  We had also checked an echo 5/63/87 for systolic murmur showing mod LVH, EF 60-65%, grade 1 DD, mild AI.   He did undergo cardiac cath  on 02/06/22. This demonstrated occlusion of prior bypass grafts including LIMA. Severe 3 vessel disease. EF is preserved. He was seen by Dr Roxan Hockey on 02/19/22 for consideration of redo CABG. This was performed on 03/05/22 with right radial graft to the LAD, SVG to OM and SVG to PL branches. His post op course was uncomplicated and he was DC.   On follow up today he is doing well. He does still have some numbness at incisions. No chest pain or dyspnea. Notes he is trying to eat better but there is still a lot of room for improvement. Is  starting to play some golf.    Past Medical History:  Diagnosis Date   Aortic insufficiency    a. Mild AI by echo 04/2016.   Basal cell carcinoma, face    Coronary disease    a. stent to Cx 1996. b. stent to RCA 2002. c. s/p CABGx3 in 2015. d.  rotational atherectomy and DESx2 to ostial to mid RCA.   Elevated liver enzymes    GERD (gastroesophageal reflux disease)    H. pylori infection tx'd in the 1970s   Hemochromatosis    History of gout 1990s X 1   Hypercholesterolemia    Hypertension    Controlled   Hypothyroid    Myocardial infarction [facility]) 2002   "mild"   Obesity    Panic disorder    Hx of panic disorder   S/P primary angioplasty with coronary stent and rotational atherectomy of ostial mRCA with DES X 2-- 7/11/7 05/20/2016   Sinus bradycardia    a. h/o marked sinus bradycardia, BB stopped.   Tachycardia    a. Noted on stress test strips in 2017. ? Atrial flutter. Further w/u underway.   Thrombocytopenia ([place])    a. per review of labs.   Type II diabetes mellitus [facility])     Past Surgical History:  Procedure Laterality Date   BASAL CELL CARCINOMA EXCISION     "face & lips"   CARDIAC CATHETERIZATION  11//2015   CARDIAC CATHETERIZATION N/A 05/11/2016   Procedure: Left Heart Cath and  Cors/Grafts Angiography;  Surgeon: Jettie Booze, MD;  Location: [place] CV LAB;  Service: Cardiovascular;  Laterality: N/A;   CARDIAC CATHETERIZATION N/A 05/19/2016   Procedure: Coronary/Graft Atherectomy;  Surgeon: Damia Bobrowski M Martinique, MD;  Location: [place] CV LAB;  Service: Cardiovascular;  Laterality: N/A;   CARDIAC CATHETERIZATION N/A 05/19/2016   Procedure: Coronary Stent Intervention;  Surgeon: Terriah Reggio M Martinique, MD;  Location: [place] CV LAB;  Service: Cardiovascular;  Laterality: N/A;   CATARACT EXTRACTION W/ INTRAOCULAR LENS  IMPLANT, BILATERAL Bilateral 2016   CORONARY ANGIOPLASTY WITH STENT PLACEMENT  1996; 2002; 05/19/2016   "1 + 2 +2"   CORONARY ARTERY BYPASS GRAFT N/A  10/03/2014   Procedure: CORONARY ARTERY BYPASS GRAFTING (CABG) x  three, using left internal mammary artery and right leg greater saphenous vein harvested endoscopically;  Surgeon: Melrose Nakayama, MD;  Location: [place];  Service: Open Heart Surgery;  Laterality: N/A;   CORONARY ARTERY BYPASS GRAFT N/A 03/05/2022   Procedure: REDO CORONARY ARTERY BYPASS GRAFTING (CABG) X 3 WITH HARVESTED LEFT GREATER SAPHENOUS VEIN AND RIGHT RADIAL ARTERY;  Surgeon: Melrose Nakayama, MD;  Location: [place];  Service: Open Heart Surgery;  Laterality: N/A;   ENDOVEIN HARVEST OF GREATER SAPHENOUS VEIN Left 03/05/2022   Procedure: ENDOVEIN HARVEST OF GREATER SAPHENOUS VEIN;  Surgeon: Melrose Nakayama, MD;  Location: [place];  Service: Open Heart Surgery;  Laterality: Left;   LEFT HEART CATH AND CORS/GRAFTS ANGIOGRAPHY N/A 02/06/2022   Procedure: LEFT HEART CATH AND CORS/GRAFTS ANGIOGRAPHY;  Surgeon: Martinique, Kinleigh Nault M, MD;  Location: [place] CV LAB;  Service: Cardiovascular;  Laterality: N/A;   LEFT HEART CATHETERIZATION WITH CORONARY ANGIOGRAM N/A 09/28/2014   Procedure: LEFT HEART CATHETERIZATION WITH CORONARY ANGIOGRAM;  Surgeon: Troy Sine, MD;  Location: [facility] CATH LAB;  Service: Cardiovascular;  Laterality: N/A;   RADIAL ARTERY HARVEST Right 03/05/2022   Procedure: RADIAL ARTERY HARVEST;  Surgeon: Melrose Nakayama, MD;  Location: [place];  Service: Open Heart Surgery;  Laterality: Right;   TEE WITHOUT CARDIOVERSION N/A 10/03/2014   Procedure: TRANSESOPHAGEAL ECHOCARDIOGRAM (TEE);  Surgeon: Melrose Nakayama, MD;  Location: [place];  Service: Open Heart Surgery;  Laterality: N/A;   TEE WITHOUT CARDIOVERSION N/A 03/05/2022   Procedure: TRANSESOPHAGEAL ECHOCARDIOGRAM (TEE);  Surgeon: Melrose Nakayama, MD;  Location: [place];  Service: Open Heart Surgery;  Laterality: N/A;   TONSILLECTOMY  1950s   TRIGGER FINGER RELEASE Bilateral 1990s-2000s    Current Medications: Current Outpatient Medications   Medication Sig Dispense Refill   acetaminophen (TYLENOL) 325 MG tablet Take 2 tablets (650 mg total) by mouth every 6 (six) hours.     allopurinol (ZYLOPRIM) 300 MG tablet Take 150 mg by mouth in the morning.     aspirin 81 MG EC tablet Take 81 mg by mouth every evening.     Cholecalciferol (VITAMIN D3) 25 MCG (1000 UT) CAPS Take 1,000 Units by mouth every evening.     clopidogrel (PLAVIX) 75 MG tablet Take 1 tablet (75 mg total) by mouth daily. 30 tablet 6   ezetimibe (ZETIA) 10 MG tablet Take 10 mg by mouth daily.       folic acid (FOLVITE) 151 MCG tablet Take 400 mcg by mouth in the morning.     glipiZIDE (GLUCOTROL XL) 5 MG 24 hr tablet Take 5 mg by mouth daily with breakfast.     levothyroxine (SYNTHROID, LEVOTHROID) 100 MCG tablet Take 100 mcg by mouth daily.  metFORMIN (GLUCOPHAGE) 500 MG tablet Take 2 tablets (1,000 mg total) by mouth in the morning and at bedtime. Take 2 tablets by mouth 2 times daily with meal.     nitroGLYCERIN (NITROSTAT) 0.4 MG SL tablet Place 0.4 mg under the tongue every 5 (five) minutes as needed for chest pain. DISSOLVE ONE TABLET UNDER THE TONGUE EVERY 5 MINUTES AS NEEDED FOR CHEST PAIN.  DO NOT EXCEED A TOTAL OF 3 DOSES IN 15 MINUTES     omega-3 acid ethyl esters (LOVAZA) 1 g capsule Take 2 g by mouth 2 (two) times daily.     pantoprazole (PROTONIX) 40 MG tablet Take 1 tablet (40 mg total) by mouth daily. (Patient taking differently: Take 40 mg by mouth every evening.) 30 tablet 11   ramipril (ALTACE) 10 MG capsule Take 20 mg by mouth in the morning.     rosuvastatin (CRESTOR) 10 MG tablet Take 10 mg by mouth in the morning.     metoprolol tartrate (LOPRESSOR) 25 MG tablet Take 1 tablet (25 mg total) by mouth 2 (two) times daily. 180 tablet 3   No current facility-administered medications for this visit.     Allergies:   Statins   Social History   Socioeconomic History   Marital status: Married    Spouse name: Not on file   Number of children: 1    Years of education: Not on file   Highest education level: Not on file  Occupational History   Occupation: Agricultural consultant [employer]  Tobacco Use   Smoking status: Former    Packs/day: 1.00    Years: 20.00    Total pack years: 20.00    Types: Cigarettes    Start date: 09/20/1995    Quit date: 09/24/1995    Years since quitting: 26.7   Smokeless tobacco: Never  Vaping Use   Vaping Use: Never used  Substance and Sexual Activity   Alcohol use: Yes    Alcohol/week: 6.0 standard drinks of alcohol    Types: 6 Cans of beer per week   Drug use: No   Sexual activity: Yes  Other Topics Concern   Not on file  Social History Narrative   Not on file   Social Determinants of Health   Financial Resource Strain: Not on file  Food Insecurity: Not on file  Transportation Needs: Not on file  Physical Activity: Not on file  Stress: Not on file  Social Connections: Not on file     Family History:  The patient's family history includes Colon cancer in his brother and mother; GI problems in his father; Heart attack in his sister; Heart failure in his mother.   ROS:   Please see the history of present illness.   All other systems are reviewed and otherwise negative.    PHYSICAL EXAM:   VS:  BP 122/70   Pulse 65   Ht '6\' 1"'$  (1.854 m)   Wt 211 lb 12.8 oz (96.1 kg)   SpO2 98%   BMI 27.94 kg/m   BMI: Body mass index is 27.94 kg/m. GENERAL:  Well appearing WM in NAD HEENT:  PERRL, EOMI, sclera are clear. Oropharynx is clear. NECK:  No jugular venous distention, carotid upstroke brisk and symmetric, no bruits, no thyromegaly or adenopathy LUNGS:  Clear to auscultation bilaterally CHEST:  Unremarkable HEART:  RRR,  PMI not displaced or sustained,S1 and S2 within normal limits, no S3, no S4: no clicks, no rubs, no murmurs ABD:  Soft, nontender. BS +, no  masses or bruits. No hepatomegaly, no splenomegaly EXT:  2 + pulses throughout, no edema, no cyanosis no clubbing SKIN:  Warm and dry.  No  rashes NEURO:  Alert and oriented x 3. Cranial nerves II through XII intact. PSYCH:  Cognitively intact   Wt Readings from Last 3 Encounters:  06/09/22 211 lb 12.8 oz (96.1 kg)  04/07/22 206 lb (93.4 kg)  04/03/22 208 lb (94.3 kg)      Studies/Labs Reviewed:   EKG:   EKG is not ordered today.  Recent Labs: 03/03/2022: ALT 48 03/06/2022: Magnesium 2.4 03/09/2022: BUN 11; Creatinine, Ser 0.74; Hemoglobin 8.8; Platelets 121; Potassium 3.8; Sodium 137   Lipid Panel    Component Value Date/Time   CHOL 130 02/04/2022 1104   TRIG 146 02/04/2022 1104   HDL 53 02/04/2022 1104   CHOLHDL 2.5 02/04/2022 1104   CHOLHDL 5.0 05/07/2016 0736   VLDL 44 (H) 05/07/2016 0736   LDLCALC 52 02/04/2022 1104    Additional studies/ records that were reviewed today include:  Labs dated 02/16/17: cholesterol 266, triglycerides 253, HDL 46, LDL 169. CBC normal except platelets 129K.  Dated 02/24/17: A1c 7.3%. Iron studies and TFTs normal. AST 90, ALT 128. Other chemistries are normal.  Dated 10/12/17: A1c 6%. Hgb normal. Potassium 5.2. AST 60. TSH normal. Triglycerides 98, HDL 58, LDL 161. Dated 11/29/18: potassium 5.1. creatinine 0.8. glucose 181. AST 97, ALT 108. Cholesterol 252, triglycerides 290, HDL 41, LDL 153. plts 143K. Otherwise CBC normal. Dated 08/14/20: cholesterol 188, triglycerides 163, HDL 46, LDL 113. AST 74, ALT 79. TSH normal. Platelets 138K. Glucose 143, A1c 7.2%. Dated 08/05/21: cholesterol 186, triglycerides 211, HDL 44, LDL 100. Plts 111K otherwise CBC normal. A1c 6.9%. sodium 135. AST and ALT 86. Glucose 170, TSH normal.  Cardiac cath 02/06/22:  LEFT HEART CATH AND CORS/GRAFTS ANGIOGRAPHY   Conclusion      Mid LAD lesion is 90% stenosed.   Prox Cx to Mid Cx lesion is 90% stenosed.   1st Mrg lesion is 90% stenosed.   Dist RCA lesion is 99% stenosed.   Ost RCA to Mid RCA lesion is 20% stenosed.   Origin lesion is 100% stenosed.   Origin to Prox Graft lesion is 100% stenosed.    Origin to Prox Graft lesion is 100% stenosed.   The left ventricular systolic function is normal.   LV end diastolic pressure is mildly elevated.   The left ventricular ejection fraction is 55-65% by visual estimate.   Severe 3 vessel obstructive CAD. There is significant progression of disease in the distal RCA and LCx at OM1 and OM2 bifurcation.  LIMA is now occluded. Known occlusion of SVGs Stents in proximal RCA are patent Good LV function Mildly elevated LVEDP   Plan; significant progression of disease since 2017 and now occluded LIMA to the LAD. Recommend CT surgery consult to consider whether he is a candidate for redo bypass. If not would at least do atherectomy and stenting of the LAD. Further PCI of the distal RCA would be very difficult since the vessel is hard to engage with prior stent at the ostium. The LCx has bifurcation disease.     Echo 02/06/22: IMPRESSIONS     1. Left ventricular ejection fraction, by estimation, is 55 to 60%. The  left ventricle has normal function. The left ventricle has no regional  wall motion abnormalities. There is mild concentric left ventricular  hypertrophy. Left ventricular diastolic  parameters are indeterminate.   2. Right ventricular  systolic function is normal. The right ventricular  size is normal. Tricuspid regurgitation signal is inadequate for assessing  PA pressure.   3. The mitral valve is normal in structure. No evidence of mitral valve  regurgitation. No evidence of mitral stenosis.   4. The aortic valve is grossly normal. There is mild calcification of the  aortic valve. There is mild thickening of the aortic valve. Aortic valve  regurgitation is mild. No aortic stenosis is present.   5. The inferior vena cava is normal in size with greater than 50%  respiratory variability, suggesting right atrial pressure of 3 mmHg.   6. Cannot exclude a small PFO.   Comparison(s): No prior Echocardiogram.   ASSESSMENT & PLAN:   CAD  s/p CABG 2015. S/p complex rotational atherectomy and stenting of the RCA in July 2017 (failed SVG to RCA).   Cardiac cath showed occlusion of all prior bypass grafts and 3 vessel disease. EF is normal without significant valvular disease ( mild AI). Now s/p redo CABG on 03/05/22 with right radial to LAD, SVG to OM, SVG to PL. Making a good recovery. We discussed the importance of eating right to take care of these grafts. Continue ACEi, DAPT for one year, metoprolol, statin. Mild AI - no change on recent Echo.  HTN - well controlled.  Hyperlipidemia with chronicaly elevated LFTs - US shows a fatty liver.  On Zetia. Now on Crestor daily. Last LDL 52.  Thrombocytopenia - no bleeding DM type 2. Last A1c was 6.9%. Now on glipizide and metformin. Would consider adding an SGLT 2 inhibitor. He plans on following up with PCP in October with repeat lab.      Signed, Shonette Rhames Martinique MD, [facility]  06/09/2022 9:49 AM    [facility]

## 2022-06-09 ENCOUNTER — Telehealth: Payer: Self-pay

## 2022-06-09 ENCOUNTER — Ambulatory Visit: Payer: Medicare PPO | Admitting: Cardiology

## 2022-06-09 ENCOUNTER — Encounter: Payer: Self-pay | Admitting: Cardiology

## 2022-06-09 VITALS — BP 122/70 | HR 65 | Ht 73.0 in | Wt 211.8 lb

## 2022-06-09 DIAGNOSIS — Z951 Presence of aortocoronary bypass graft: Secondary | ICD-10-CM

## 2022-06-09 DIAGNOSIS — I25708 Atherosclerosis of coronary artery bypass graft(s), unspecified, with other forms of angina pectoris: Secondary | ICD-10-CM | POA: Diagnosis not present

## 2022-06-09 DIAGNOSIS — I10 Essential (primary) hypertension: Secondary | ICD-10-CM

## 2022-06-09 DIAGNOSIS — E7800 Pure hypercholesterolemia, unspecified: Secondary | ICD-10-CM | POA: Diagnosis not present

## 2022-06-09 DIAGNOSIS — E119 Type 2 diabetes mellitus without complications: Secondary | ICD-10-CM | POA: Diagnosis not present

## 2022-06-09 MED ORDER — METOPROLOL TARTRATE 25 MG PO TABS: 25.0000 mg | ORAL_TABLET | Freq: Two times a day (BID) | ORAL | 3 refills | Status: AC

## 2022-06-09 NOTE — Telephone Encounter (Signed)
Dr.Jordan's 06/09/22 office note faxed to PCP Dr.James Liffrig at fax # [phone].

## 2022-08-03 ENCOUNTER — Ambulatory Visit: Payer: Medicare PPO | Admitting: Cardiology

## 2022-10-07 IMAGING — DX DG CHEST 2V
2 series · 2 of 2 positions shown · non-contrast
Comparison: 03/07/2022, 03/03/2022

CLINICAL DATA: Status post CABG

EXAM:
CHEST - 2 VIEW

[dg chest 2 view (1 of 2)]
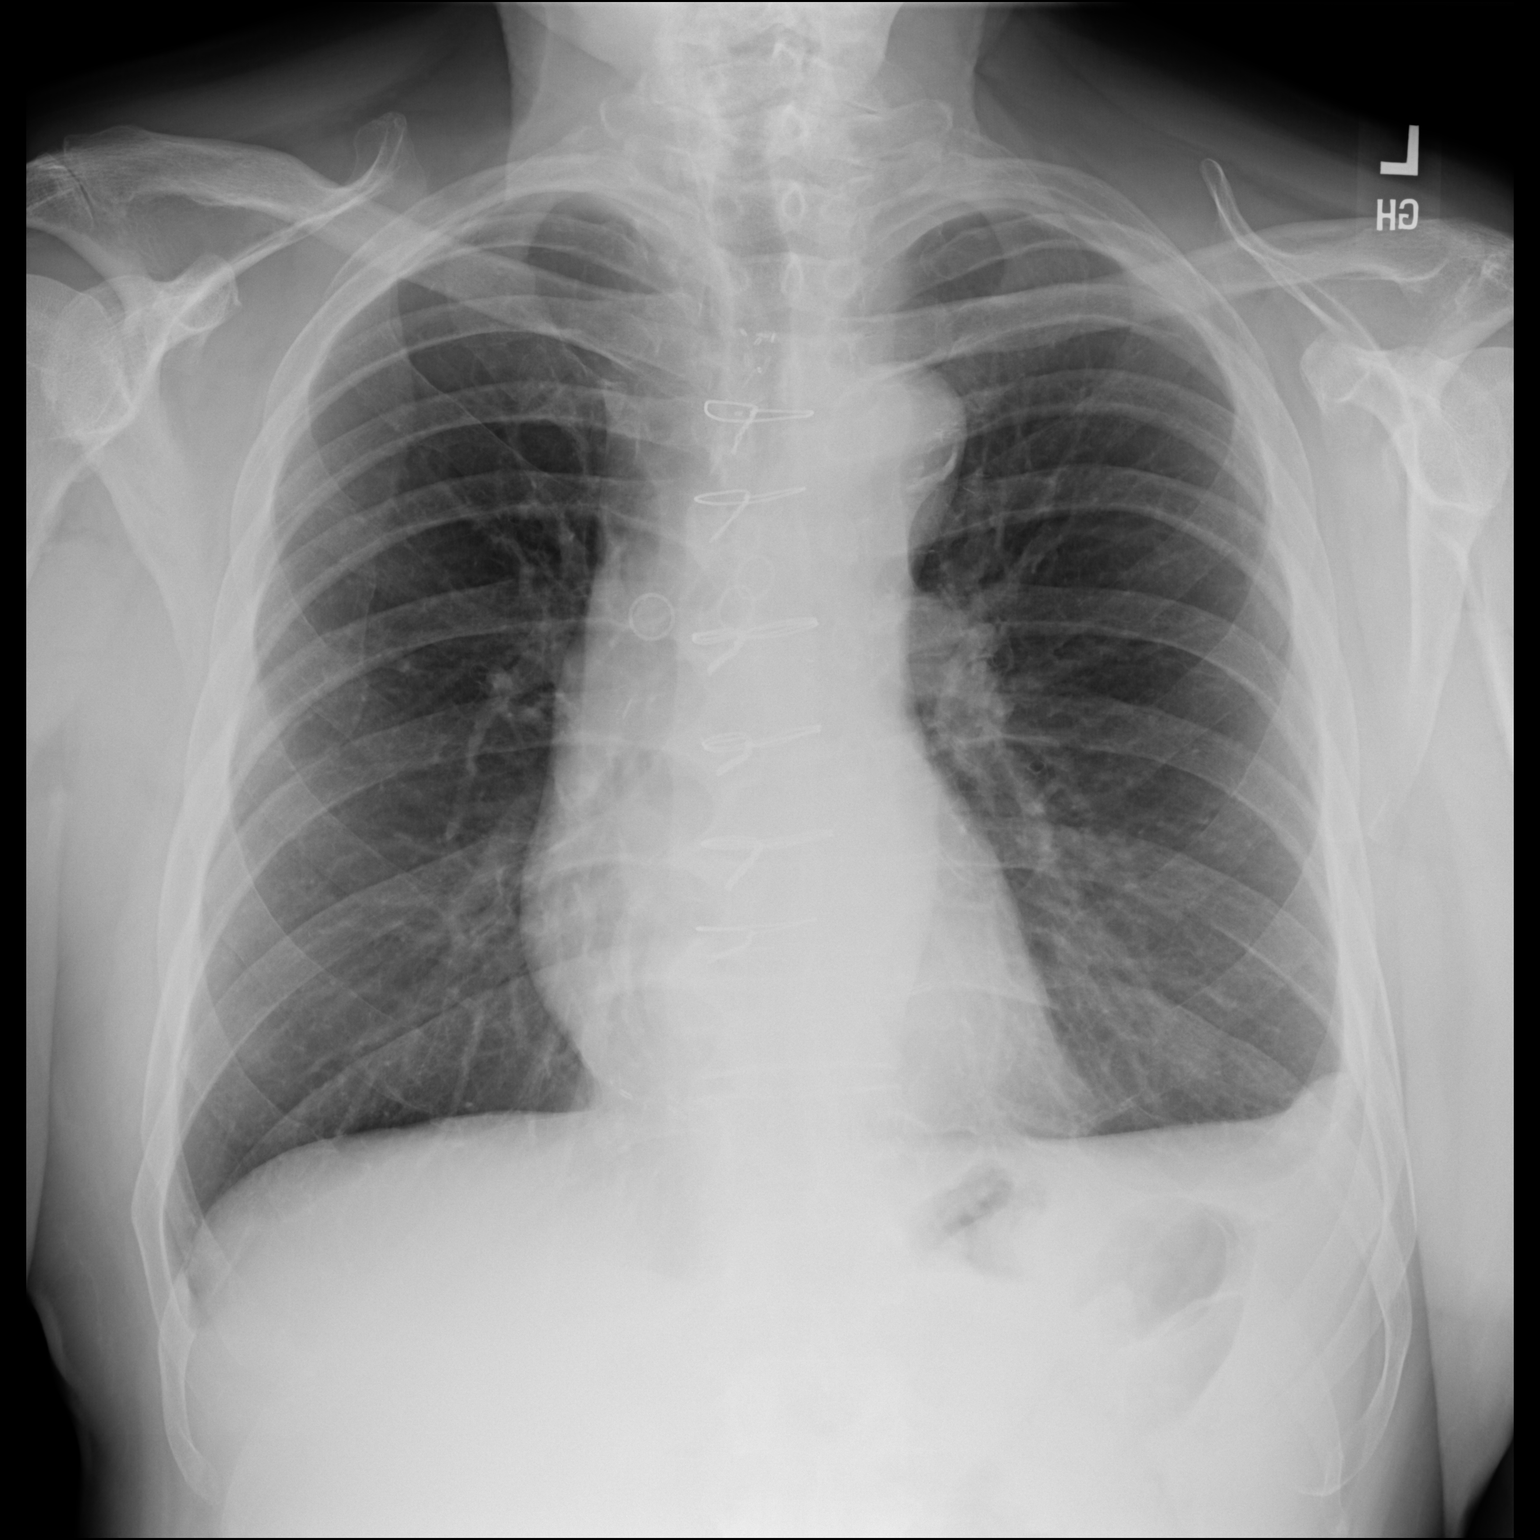

[dg chest 2 view (2 of 2)]
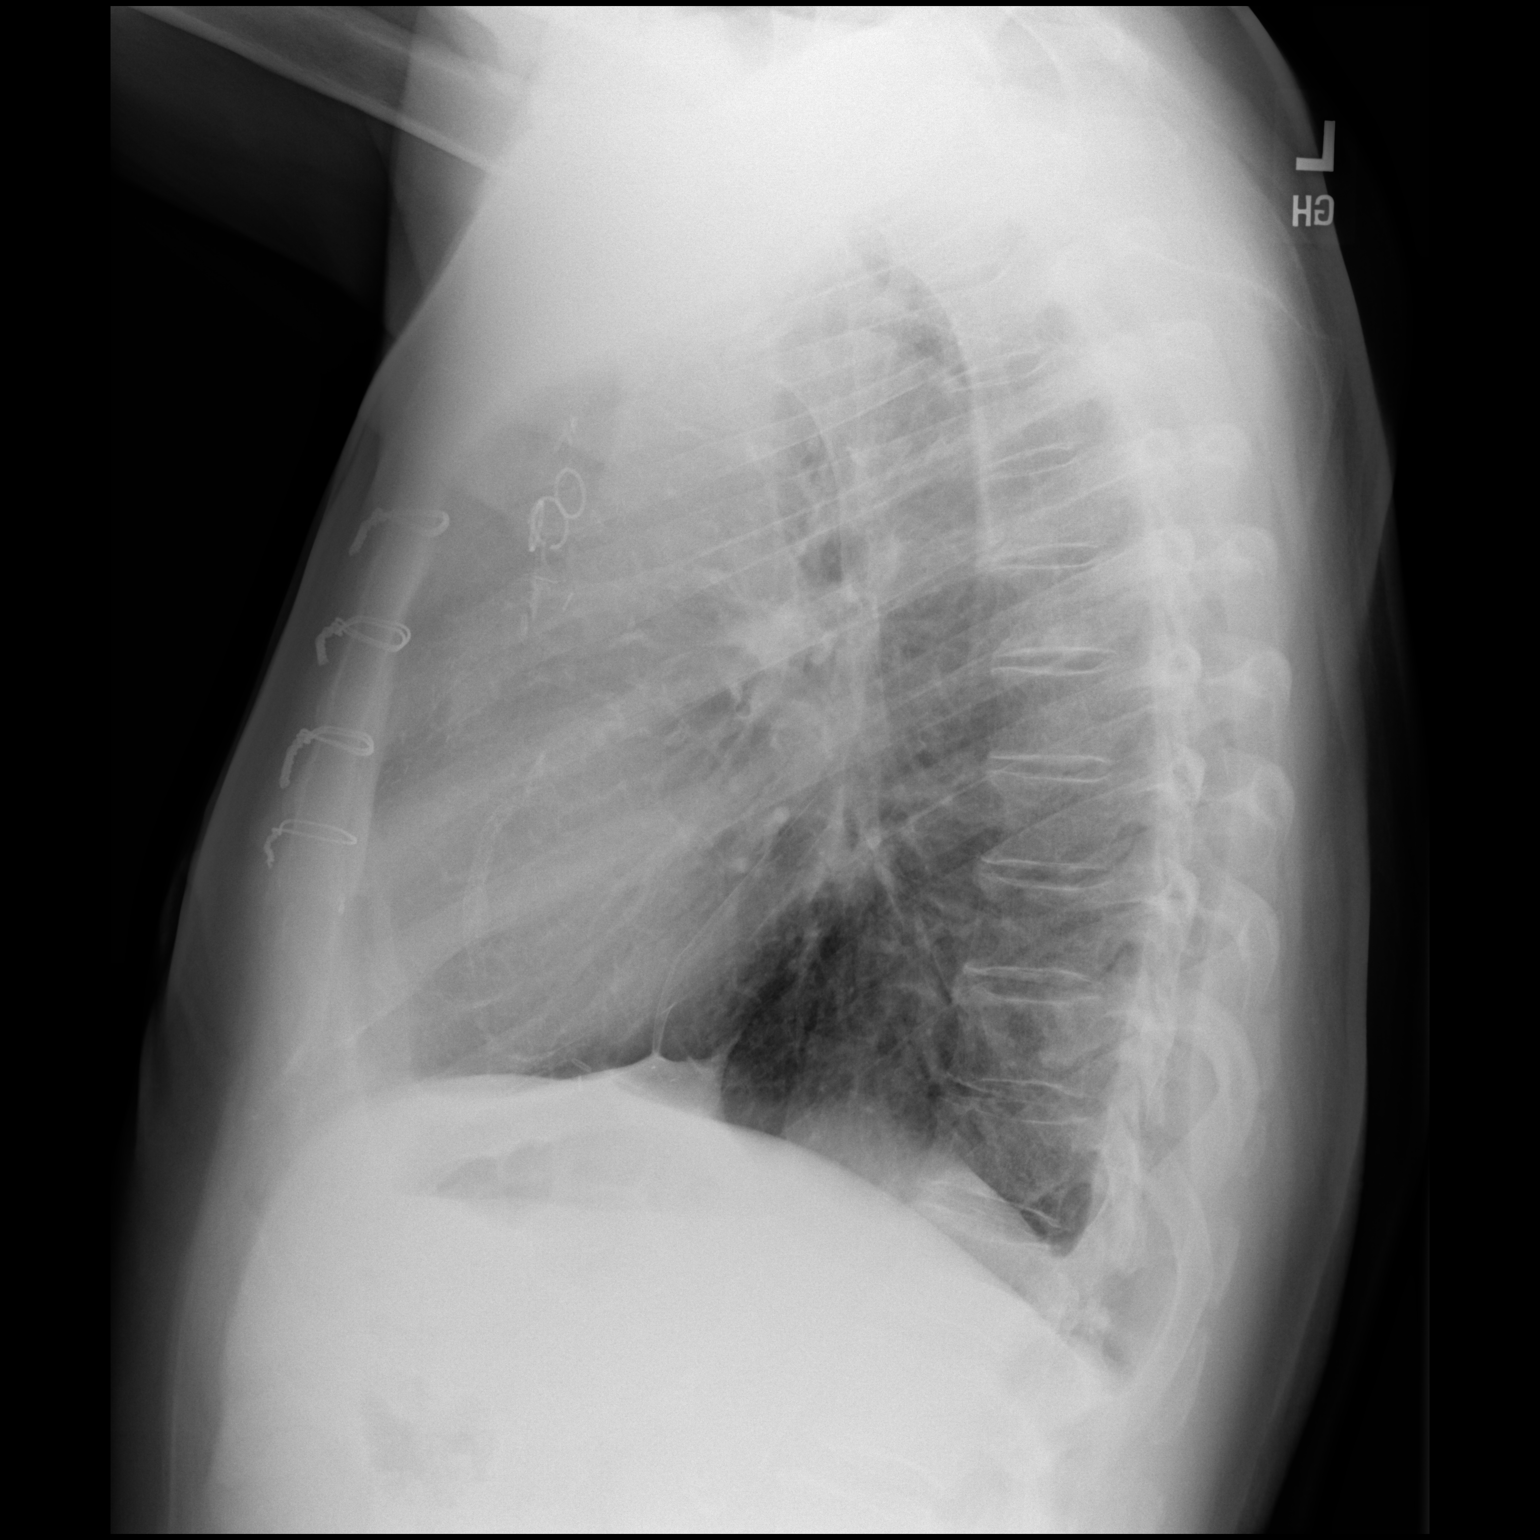

[2 of 2 positions shown; findings below may reference images not displayed]

FINDINGS: Postsurgical changes from CABG. The heart size and mediastinal
contours are within normal limits. Aortic atherosclerosis. Chronic
blunting of the left costophrenic angle. Both lungs are clear. No
pleural effusion or pneumothorax. The visualized skeletal structures
are unremarkable.
IMPRESSION: No active cardiopulmonary disease.

## 2022-10-27 ENCOUNTER — Other Ambulatory Visit: Payer: Self-pay | Admitting: Thoracic Surgery (Cardiothoracic Vascular Surgery)

## 2022-10-29 ENCOUNTER — Other Ambulatory Visit: Payer: Self-pay

## 2022-10-29 MED ORDER — CLOPIDOGREL BISULFATE 75 MG PO TABS: 75.0000 mg | ORAL_TABLET | Freq: Every day | ORAL | 8 refills | Status: DC

## 2022-12-07 NOTE — Progress Notes (Signed)
Cardiology Office Note    Date:  12/14/2022  ID:  Miguel Vasquez, DOB [birth year], MRN [id number] PCP:  Audie Box, MD  Cardiologist:  Shalon Salado Martinique MD  Chief Complaint: f/u CAD  History of Present Illness:  Miguel Vasquez is a 77 y.o. male seen for evaluation of DOE and some chest pain. He has a history of CAD (stent to Cx 1996, stent to RCA 2002, s/p CABGx3 in 2015, rotational atherectomy/DESx2 to RCA 05/2017), HLD (intolerant of statins due to elevated liver/muscle enzymes), HTN, DM, hemochromatosis, cervical disc disease, panic disorder, hypothyroidism, h/o marked sinus bradycardia (BB stopped)   Last 2D Echo 09/2014: mild-mod LVH, EF 55-60%, cannot exclude RWMA, mild AI. This past summer he was complaining of atypical chest pain but this was similar to what prompted CABG. Nuc was abnormal so he underwent diagnostic cath 05/11/16 showing severe, diffuse calcific disease and a very large dominant RCA - essentially, chronic occlusion at the ostium. There were brisk left to right collaterals. He had occluded SVG-OM. 50% mLAD, patent LIMA-LAD. LVEF and LVEDP were normal. He was brought back for staged PCI with successful rotational atherectomy and stenting of the ostial to mid RCA with DES x 2. DAPT was recommended indefinitely.  We had also checked an echo 4/78/29 for systolic murmur showing mod LVH, EF 60-65%, grade 1 DD, mild AI.   He did undergo cardiac cath  on 02/06/22. This demonstrated occlusion of prior bypass grafts including LIMA. Severe 3 vessel disease. EF is preserved. He was seen by Dr Roxan Hockey on 02/19/22 for consideration of redo CABG. This was performed on 03/05/22 with right radial graft to the LAD, SVG to OM and SVG to PL branches. His post op course was uncomplicated and he was DC.   On follow up today he is doing well. He does still have some numbness at incisions. No chest pain or dyspnea.he has been fairly inactive and has gained about 10 lbs.    Past Medical History:   Diagnosis Date   Aortic insufficiency    a. Mild AI by echo 04/2016.   Basal cell carcinoma, face    Coronary disease    a. stent to Cx 1996. b. stent to RCA 2002. c. s/p CABGx3 in 2015. d.  rotational atherectomy and DESx2 to ostial to mid RCA.   Elevated liver enzymes    GERD (gastroesophageal reflux disease)    H. pylori infection tx'd in the 1970s   Hemochromatosis    History of gout 1990s X 1   Hypercholesterolemia    Hypertension    Controlled   Hypothyroid    Myocardial infarction [facility]) 2002   "mild"   Obesity    Panic disorder    Hx of panic disorder   S/P primary angioplasty with coronary stent and rotational atherectomy of ostial mRCA with DES X 2-- 7/11/7 05/20/2016   Sinus bradycardia    a. h/o marked sinus bradycardia, BB stopped.   Tachycardia    a. Noted on stress test strips in 2017. ? Atrial flutter. Further w/u underway.   Thrombocytopenia ([place])    a. per review of labs.   Type II diabetes mellitus [facility])     Past Surgical History:  Procedure Laterality Date   BASAL CELL CARCINOMA EXCISION     "face & lips"   CARDIAC CATHETERIZATION  11//2015   CARDIAC CATHETERIZATION N/A 05/11/2016   Procedure: Left Heart Cath and Cors/Grafts Angiography;  Surgeon: Jettie Booze, MD;  Location: [place] [facility]  LAB;  Service: Cardiovascular;  Laterality: N/A;   CARDIAC CATHETERIZATION N/A 05/19/2016   Procedure: Coronary/Graft Atherectomy;  Surgeon: Rolla Kedzierski M Martinique, MD;  Location: [place] [facility];  Service: Cardiovascular;  Laterality: N/A;   CARDIAC CATHETERIZATION N/A 05/19/2016   Procedure: Coronary Stent Intervention;  Surgeon: Jahid Weida M Martinique, MD;  Location: [place] [facility];  Service: Cardiovascular;  Laterality: N/A;   CATARACT EXTRACTION W/ INTRAOCULAR LENS  IMPLANT, BILATERAL Bilateral 2016   CORONARY ANGIOPLASTY WITH STENT PLACEMENT  1996; 2002; 05/19/2016   "1 + 2 +2"   CORONARY ARTERY BYPASS GRAFT N/A 10/03/2014   Procedure: CORONARY ARTERY BYPASS GRAFTING  (CABG) x  three, using left internal mammary artery and right leg greater saphenous vein harvested endoscopically;  Surgeon: Melrose Nakayama, MD;  Location: [place];  Service: Open Heart Surgery;  Laterality: N/A;   CORONARY ARTERY BYPASS GRAFT N/A 03/05/2022   Procedure: REDO CORONARY ARTERY BYPASS GRAFTING (CABG) X 3 WITH HARVESTED LEFT GREATER SAPHENOUS VEIN AND RIGHT RADIAL ARTERY;  Surgeon: Melrose Nakayama, MD;  Location: [place];  Service: Open Heart Surgery;  Laterality: N/A;   ENDOVEIN HARVEST OF GREATER SAPHENOUS VEIN Left 03/05/2022   Procedure: ENDOVEIN HARVEST OF GREATER SAPHENOUS VEIN;  Surgeon: Melrose Nakayama, MD;  Location: [place];  Service: Open Heart Surgery;  Laterality: Left;   LEFT HEART CATH AND CORS/GRAFTS ANGIOGRAPHY N/A 02/06/2022   Procedure: LEFT HEART CATH AND CORS/GRAFTS ANGIOGRAPHY;  Surgeon: Martinique, Jenniferlynn Saad M, MD;  Location: [place] [facility] LAB;  Service: Cardiovascular;  Laterality: N/A;   LEFT HEART CATHETERIZATION WITH CORONARY ANGIOGRAM N/A 09/28/2014   Procedure: LEFT HEART CATHETERIZATION WITH CORONARY ANGIOGRAM;  Surgeon: Troy Sine, MD;  Location: [facility] C[facility] LAB;  Service: Cardiovascular;  Laterality: N/A;   RADIAL ARTERY HARVEST Right 03/05/2022   Procedure: RADIAL ARTERY HARVEST;  Surgeon: Melrose Nakayama, MD;  Location: [place];  Service: Open Heart Surgery;  Laterality: Right;   TEE WITHOUT CARDIOVERSION N/A 10/03/2014   Procedure: TRANSESOPHAGEAL ECHOCARDIOGRAM (TEE);  Surgeon: Melrose Nakayama, MD;  Location: [place];  Service: Open Heart Surgery;  Laterality: N/A;   TEE WITHOUT CARDIOVERSION N/A 03/05/2022   Procedure: TRANSESOPHAGEAL ECHOCARDIOGRAM (TEE);  Surgeon: Melrose Nakayama, MD;  Location: [place];  Service: Open Heart Surgery;  Laterality: N/A;   TONSILLECTOMY  1950s   TRIGGER FINGER RELEASE Bilateral 1990s-2000s    Current Medications: Current Outpatient Medications  Medication Sig Dispense Refill   acetaminophen (TYLENOL)  325 MG tablet Take 2 tablets (650 mg total) by mouth every 6 (six) hours.     allopurinol (ZYLOPRIM) 300 MG tablet Take 150 mg by mouth in the morning.     aspirin 81 MG EC tablet Take 81 mg by mouth every evening.     Cholecalciferol (VITAMIN D3) 25 MCG (1000 UT) CAPS Take 1,000 Units by mouth every evening.     clopidogrel (PLAVIX) 75 MG tablet Take 1 tablet (75 mg total) by mouth daily. 30 tablet 8   ezetimibe (ZETIA) 10 MG tablet Take 10 mg by mouth daily.       folic acid (FOLVITE) 423 MCG tablet Take 400 mcg by mouth in the morning.     glipiZIDE (GLUCOTROL XL) 5 MG 24 hr tablet Take 5 mg by mouth daily with breakfast.     latanoprost (XALATAN) 0.005 % ophthalmic solution 1 drop at bedtime.     levothyroxine (SYNTHROID, LEVOTHROID) 100 MCG tablet Take 100 mcg by mouth daily.  metFORMIN (GLUCOPHAGE) 500 MG tablet Take 2 tablets (1,000 mg total) by mouth in the morning and at bedtime. Take 2 tablets by mouth 2 times daily with meal.     metoprolol tartrate (LOPRESSOR) 25 MG tablet Take 1 tablet (25 mg total) by mouth 2 (two) times daily. 180 tablet 3   nitroGLYCERIN (NITROSTAT) 0.4 MG SL tablet Place 0.4 mg under the tongue every 5 (five) minutes as needed for chest pain. DISSOLVE ONE TABLET UNDER THE TONGUE EVERY 5 MINUTES AS NEEDED FOR CHEST PAIN.  DO NOT EXCEED A TOTAL OF 3 DOSES IN 15 MINUTES     omega-3 acid ethyl esters (LOVAZA) 1 g capsule Take 2 g by mouth 2 (two) times daily.     pantoprazole (PROTONIX) 40 MG tablet Take 1 tablet (40 mg total) by mouth daily. (Patient taking differently: Take 40 mg by mouth every evening.) 30 tablet 11   ramipril (ALTACE) 10 MG capsule Take 20 mg by mouth in the morning.     rosuvastatin (CRESTOR) 10 MG tablet Take 10 mg by mouth in the morning.     No current facility-administered medications for this visit.     Allergies:   Statins   Social History   Socioeconomic History   Marital status: Married    Spouse name: Not on file   Number  of children: 1   Years of education: Not on file   Highest education level: Not on file  Occupational History   Occupation: Agricultural consultant [employer]  Tobacco Use   Smoking status: Former    Packs/day: 1.00    Years: 20.00    Total pack years: 20.00    Types: Cigarettes    Start date: 09/20/1995    Quit date: 09/24/1995    Years since quitting: 27.2   Smokeless tobacco: Never  Vaping Use   Vaping Use: Never used  Substance and Sexual Activity   Alcohol use: Yes    Alcohol/week: 6.0 standard drinks of alcohol    Types: 6 Cans of beer per week   Drug use: No   Sexual activity: Yes  Other Topics Concern   Not on file  Social History Narrative   Not on file   Social Determinants of Health   Financial Resource Strain: Not on file  Food Insecurity: Not on file  Transportation Needs: Not on file  Physical Activity: Not on file  Stress: Not on file  Social Connections: Not on file     Family History:  The patient's family history includes Colon cancer in his brother and mother; GI problems in his father; Heart attack in his sister; Heart failure in his mother.   ROS:   Please see the history of present illness.   All other systems are reviewed and otherwise negative.    PHYSICAL EXAM:   VS:  BP (!) 160/80   Pulse 65   Ht '6\' 1"'$  (1.854 m)   Wt 221 lb 6.4 oz (100.4 kg)   SpO2 97%   BMI 29.21 kg/m   BMI: Body mass index is 29.21 kg/m. GENERAL:  Well appearing WM in NAD HEENT:  PERRL, EOMI, sclera are clear. Oropharynx is clear. NECK:  No jugular venous distention, carotid upstroke brisk and symmetric, no bruits, no thyromegaly or adenopathy LUNGS:  Clear to auscultation bilaterally CHEST:  Unremarkable HEART:  RRR,  PMI not displaced or sustained,S1 and S2 within normal limits, no S3, no S4: no clicks, no rubs, no murmurs ABD:  Soft, nontender. BS +,  no masses or bruits. No hepatomegaly, no splenomegaly EXT:  2 + pulses throughout, no edema, no cyanosis no clubbing SKIN:  Warm  and dry.  No rashes NEURO:  Alert and oriented x 3. Cranial nerves II through XII intact. PSYCH:  Cognitively intact   Wt Readings from Last 3 Encounters:  12/14/22 221 lb 6.4 oz (100.4 kg)  06/09/22 211 lb 12.8 oz (96.1 kg)  04/07/22 206 lb (93.4 kg)      Studies/Labs Reviewed:   EKG:   EKG is not ordered today.  Recent Labs: 03/03/2022: ALT 48 03/06/2022: Magnesium 2.4 03/09/2022: BUN 11; Creatinine, Ser 0.74; Hemoglobin 8.8; Platelets 121; Potassium 3.8; Sodium 137   Lipid Panel    Component Value Date/Time   CHOL 130 02/04/2022 1104   TRIG 146 02/04/2022 1104   HDL 53 02/04/2022 1104   CHOLHDL 2.5 02/04/2022 1104   CHOLHDL 5.0 05/07/2016 0736   VLDL 44 (H) 05/07/2016 0736   LDLCALC 52 02/04/2022 1104    Additional studies/ records that were reviewed today include:  Labs dated 02/16/17: cholesterol 266, triglycerides 253, HDL 46, LDL 169. CBC normal except platelets 129K.  Dated 02/24/17: A1c 7.3%. Iron studies and TFTs normal. AST 90, ALT 128. Other chemistries are normal.  Dated 10/12/17: A1c 6%. Hgb normal. Potassium 5.2. AST 60. TSH normal. Triglycerides 98, HDL 58, LDL 161. Dated 11/29/18: potassium 5.1. creatinine 0.8. glucose 181. AST 97, ALT 108. Cholesterol 252, triglycerides 290, HDL 41, LDL 153. plts 143K. Otherwise CBC normal. Dated 08/14/20: cholesterol 188, triglycerides 163, HDL 46, LDL 113. AST 74, ALT 79. TSH normal. Platelets 138K. Glucose 143, A1c 7.2%. Dated 08/05/21: cholesterol 186, triglycerides 211, HDL 44, LDL 100. Plts 111K otherwise CBC normal. A1c 6.9%. sodium 135. AST and ALT 86. Glucose 170, TSH normal. Dated 07/29/22: A1c  7.1%. cholesterol 143, triglycerides 123, HDL 55, LDL 63. AST 52, ALT 45. Bili 1.6. otherwise CMET normal. TSH and CK normal.   Cardiac cath 02/06/22:  LEFT HEART CATH AND CORS/GRAFTS ANGIOGRAPHY   Conclusion      Mid LAD lesion is 90% stenosed.   Prox Cx to Mid Cx lesion is 90% stenosed.   1st Mrg lesion is 90% stenosed.    Dist RCA lesion is 99% stenosed.   Ost RCA to Mid RCA lesion is 20% stenosed.   Origin lesion is 100% stenosed.   Origin to Prox Graft lesion is 100% stenosed.   Origin to Prox Graft lesion is 100% stenosed.   The left ventricular systolic function is normal.   LV end diastolic pressure is mildly elevated.   The left ventricular ejection fraction is 55-65% by visual estimate.   Severe 3 vessel obstructive CAD. There is significant progression of disease in the distal RCA and LCx at OM1 and OM2 bifurcation.  LIMA is now occluded. Known occlusion of SVGs Stents in proximal RCA are patent Good LV function Mildly elevated LVEDP   Plan; significant progression of disease since 2017 and now occluded LIMA to the LAD. Recommend CT surgery consult to consider whether he is a candidate for redo bypass. If not would at least do atherectomy and stenting of the LAD. Further PCI of the distal RCA would be very difficult since the vessel is hard to engage with prior stent at the ostium. The LCx has bifurcation disease.     Echo 02/06/22: IMPRESSIONS     1. Left ventricular ejection fraction, by estimation, is 55 to 60%. The  left ventricle has normal function.  The left ventricle has no regional  wall motion abnormalities. There is mild concentric left ventricular  hypertrophy. Left ventricular diastolic  parameters are indeterminate.   2. Right ventricular systolic function is normal. The right ventricular  size is normal. Tricuspid regurgitation signal is inadequate for assessing  PA pressure.   3. The mitral valve is normal in structure. No evidence of mitral valve  regurgitation. No evidence of mitral stenosis.   4. The aortic valve is grossly normal. There is mild calcification of the  aortic valve. There is mild thickening of the aortic valve. Aortic valve  regurgitation is mild. No aortic stenosis is present.   5. The inferior vena cava is normal in size with greater than 50%  respiratory  variability, suggesting right atrial pressure of 3 mmHg.   6. Cannot exclude a small PFO.   Comparison(s): No prior Echocardiogram.   ASSESSMENT & PLAN:   CAD s/p CABG 2015. S/p complex rotational atherectomy and stenting of the RCA in July 2017 (failed SVG to RCA).   Cardiac cath showed occlusion of all prior bypass grafts and 3 vessel disease. EF is normal without significant valvular disease ( mild AI). Now s/p redo CABG on 03/05/22 with right radial to LAD, SVG to OM, SVG to PL. Making a good recovery. We discussed the importance of eating right to take care of these grafts. Continue ACEi, DAPT for one year, metoprolol, statin. Mild AI - no change on last Echo.  HTN - well controlled.  Hyperlipidemia with chronicaly elevated LFTs - US shows a fatty liver.  On Zetia. Now on Crestor daily. Last LDL 63. LFTs improved. CK normal. .  Thrombocytopenia - no bleeding DM type 2. Last A1c was 7.1%. Now on glipizide and metformin. Would consider adding an SGLT 2 inhibitor. Needs to do better with diet and weight control.     Signed, Honesty Menta Martinique MD, [facility]  12/14/2022 10:24 AM    [place]

## 2022-12-14 ENCOUNTER — Encounter: Payer: Self-pay | Admitting: Cardiology

## 2022-12-14 ENCOUNTER — Ambulatory Visit: Payer: Medicare PPO | Attending: Cardiology | Admitting: Cardiology

## 2022-12-14 VITALS — BP 160/80 | HR 65 | Ht 73.0 in | Wt 221.4 lb

## 2022-12-14 DIAGNOSIS — E7800 Pure hypercholesterolemia, unspecified: Secondary | ICD-10-CM

## 2022-12-14 DIAGNOSIS — Z951 Presence of aortocoronary bypass graft: Secondary | ICD-10-CM | POA: Diagnosis not present

## 2022-12-14 DIAGNOSIS — I25708 Atherosclerosis of coronary artery bypass graft(s), unspecified, with other forms of angina pectoris: Secondary | ICD-10-CM | POA: Diagnosis not present

## 2022-12-14 DIAGNOSIS — I10 Essential (primary) hypertension: Secondary | ICD-10-CM

## 2022-12-14 DIAGNOSIS — E119 Type 2 diabetes mellitus without complications: Secondary | ICD-10-CM

## 2022-12-14 NOTE — Patient Instructions (Signed)
Medication Instructions:   Your physician recommends that you continue on your current medications as directed. Please refer to the Current Medication list given to you today.  *If you need a refill on your cardiac medications before your next appointment, please call your pharmacy*  Lab Work: NONE ordered at this time of appointment   If you have labs (blood work) drawn today and your tests are completely normal, you will receive your results only by: [place] (if you have MyChart) OR A paper copy in the mail If you have any lab test that is abnormal or we need to change your treatment, we will call you to review the results.  Testing/Procedures: NONE ordered at this time of appointment   Follow-Up: At [facility], you and your health needs are our priority.  As part of our continuing mission to provide you with exceptional heart care, we have created designated Provider Care Teams.  These Care Teams include your primary Cardiologist (physician) and Advanced Practice Providers (APPs -  Physician Assistants and Nurse Practitioners) who all work together to provide you with the care you need, when you need it.  Your next appointment:   6 month(s)  Provider:   Peter Martinique, MD     Other Instructions

## 2023-02-21 ENCOUNTER — Encounter (HOSPITAL_COMMUNITY): Payer: Self-pay

## 2023-02-21 ENCOUNTER — Other Ambulatory Visit: Payer: Self-pay

## 2023-02-21 ENCOUNTER — Encounter (HOSPITAL_COMMUNITY): Payer: Self-pay | Admitting: Internal Medicine

## 2023-02-21 ENCOUNTER — Inpatient Hospital Stay (HOSPITAL_COMMUNITY)
Admit: 2023-02-21 | Discharge: 2023-02-27 | DRG: 418 | Disposition: A | Discharge status: Home / Self Care | Payer: Medicare PPO | Source: Other Acute Inpatient Hospital | Attending: Internal Medicine | Admitting: Internal Medicine

## 2023-02-21 DIAGNOSIS — Z7984 Long term (current) use of oral hypoglycemic drugs: Secondary | ICD-10-CM | POA: Diagnosis not present

## 2023-02-21 DIAGNOSIS — E039 Hypothyroidism, unspecified: Secondary | ICD-10-CM | POA: Diagnosis present

## 2023-02-21 DIAGNOSIS — E7800 Pure hypercholesterolemia, unspecified: Secondary | ICD-10-CM | POA: Diagnosis present

## 2023-02-21 DIAGNOSIS — Z955 Presence of coronary angioplasty implant and graft: Secondary | ICD-10-CM

## 2023-02-21 DIAGNOSIS — Z951 Presence of aortocoronary bypass graft: Secondary | ICD-10-CM | POA: Diagnosis not present

## 2023-02-21 DIAGNOSIS — Z79890 Hormone replacement therapy (postmenopausal): Secondary | ICD-10-CM | POA: Diagnosis not present

## 2023-02-21 DIAGNOSIS — I252 Old myocardial infarction: Secondary | ICD-10-CM

## 2023-02-21 DIAGNOSIS — K810 Acute cholecystitis: Secondary | ICD-10-CM | POA: Diagnosis present

## 2023-02-21 DIAGNOSIS — I25810 Atherosclerosis of coronary artery bypass graft(s) without angina pectoris: Secondary | ICD-10-CM | POA: Diagnosis present

## 2023-02-21 DIAGNOSIS — Z85828 Personal history of other malignant neoplasm of skin: Secondary | ICD-10-CM

## 2023-02-21 DIAGNOSIS — E1165 Type 2 diabetes mellitus with hyperglycemia: Secondary | ICD-10-CM | POA: Diagnosis present

## 2023-02-21 DIAGNOSIS — I2510 Atherosclerotic heart disease of native coronary artery without angina pectoris: Secondary | ICD-10-CM | POA: Diagnosis present

## 2023-02-21 DIAGNOSIS — Z87891 Personal history of nicotine dependence: Secondary | ICD-10-CM

## 2023-02-21 DIAGNOSIS — Z888 Allergy status to other drugs, medicaments and biological substances status: Secondary | ICD-10-CM | POA: Diagnosis not present

## 2023-02-21 DIAGNOSIS — R001 Bradycardia, unspecified: Secondary | ICD-10-CM | POA: Diagnosis present

## 2023-02-21 DIAGNOSIS — Z800 Family history of malignant neoplasm of digestive organs: Secondary | ICD-10-CM

## 2023-02-21 DIAGNOSIS — K219 Gastro-esophageal reflux disease without esophagitis: Secondary | ICD-10-CM | POA: Diagnosis present

## 2023-02-21 DIAGNOSIS — D696 Thrombocytopenia, unspecified: Secondary | ICD-10-CM | POA: Diagnosis present

## 2023-02-21 DIAGNOSIS — Z7982 Long term (current) use of aspirin: Secondary | ICD-10-CM

## 2023-02-21 DIAGNOSIS — Z01818 Encounter for other preprocedural examination: Secondary | ICD-10-CM | POA: Diagnosis not present

## 2023-02-21 DIAGNOSIS — E876 Hypokalemia: Secondary | ICD-10-CM | POA: Diagnosis present

## 2023-02-21 DIAGNOSIS — I25119 Atherosclerotic heart disease of native coronary artery with unspecified angina pectoris: Secondary | ICD-10-CM | POA: Diagnosis not present

## 2023-02-21 DIAGNOSIS — Z8249 Family history of ischemic heart disease and other diseases of the circulatory system: Secondary | ICD-10-CM | POA: Diagnosis not present

## 2023-02-21 DIAGNOSIS — Z8711 Personal history of peptic ulcer disease: Secondary | ICD-10-CM

## 2023-02-21 DIAGNOSIS — I2583 Coronary atherosclerosis due to lipid rich plaque: Secondary | ICD-10-CM | POA: Diagnosis not present

## 2023-02-21 DIAGNOSIS — E871 Hypo-osmolality and hyponatremia: Secondary | ICD-10-CM | POA: Diagnosis not present

## 2023-02-21 DIAGNOSIS — Z79899 Other long term (current) drug therapy: Secondary | ICD-10-CM

## 2023-02-21 DIAGNOSIS — K82A1 Gangrene of gallbladder in cholecystitis: Secondary | ICD-10-CM | POA: Diagnosis present

## 2023-02-21 DIAGNOSIS — I351 Nonrheumatic aortic (valve) insufficiency: Secondary | ICD-10-CM | POA: Diagnosis present

## 2023-02-21 DIAGNOSIS — I10 Essential (primary) hypertension: Secondary | ICD-10-CM | POA: Diagnosis present

## 2023-02-21 DIAGNOSIS — E119 Type 2 diabetes mellitus without complications: Secondary | ICD-10-CM

## 2023-02-21 DIAGNOSIS — Z7902 Long term (current) use of antithrombotics/antiplatelets: Secondary | ICD-10-CM

## 2023-02-21 DIAGNOSIS — R7401 Elevation of levels of liver transaminase levels: Secondary | ICD-10-CM | POA: Diagnosis present

## 2023-02-21 LAB — CBC
HCT: 44.3 % (ref 39.0–52.0)
Hemoglobin: 15.1 g/dL (ref 13.0–17.0)
MCH: 30.4 pg (ref 26.0–34.0)
MCHC: 34.1 g/dL (ref 30.0–36.0)
MCV: 89.3 fL (ref 80.0–100.0)
Platelets: 117 10*3/uL — ABNORMAL LOW (ref 150–400)
RBC: 4.96 MIL/uL (ref 4.22–5.81)
RDW: 12.9 % (ref 11.5–15.5)
WBC: 8.2 10*3/uL (ref 4.0–10.5)
nRBC: 0 % (ref 0.0–0.2)

## 2023-02-21 LAB — PROTIME-INR
INR: 1.2 (ref 0.8–1.2)
Prothrombin Time: 15.1 seconds (ref 11.4–15.2)

## 2023-02-21 LAB — COMPREHENSIVE METABOLIC PANEL
ALT: 41 U/L (ref 0–44)
AST: 51 U/L — ABNORMAL HIGH (ref 15–41)
Albumin: 3.9 g/dL (ref 3.5–5.0)
Alkaline Phosphatase: 50 U/L (ref 38–126)
Anion gap: 12 (ref 5–15)
BUN: 8 mg/dL (ref 8–23)
CO2: 23 mmol/L (ref 22–32)
Calcium: 9.1 mg/dL (ref 8.9–10.3)
Chloride: 97 mmol/L — ABNORMAL LOW (ref 98–111)
Creatinine, Ser: 0.9 mg/dL (ref 0.61–1.24)
GFR, Estimated: >60 mL/min (ref >60)
Glucose, Bld: 269 mg/dL — ABNORMAL HIGH (ref 70–99)
Potassium: 3.8 mmol/L (ref 3.5–5.1)
Sodium: 132 mmol/L — ABNORMAL LOW (ref 135–145)
Total Bilirubin: 2.6 mg/dL — ABNORMAL HIGH (ref 0.3–1.2)
Total Protein: 7.2 g/dL (ref 6.5–8.1)

## 2023-02-21 LAB — HEMOGLOBIN A1C
Hgb A1c MFr Bld: 8.6 % — ABNORMAL HIGH (ref 4.8–5.6)
Mean Plasma Glucose: 200.12 mg/dL

## 2023-02-21 LAB — GLUCOSE, CAPILLARY
Glucose-Capillary: 248 mg/dL — ABNORMAL HIGH (ref 70–99)
Glucose-Capillary: 253 mg/dL — ABNORMAL HIGH (ref 70–99)
Glucose-Capillary: 272 mg/dL — ABNORMAL HIGH (ref 70–99)

## 2023-02-21 MED ORDER — INSULIN ASPART 100 UNIT/ML IJ SOLN
0.0000 [IU] | Freq: Three times a day (TID) | INTRAMUSCULAR | Status: DC
  Administered 2023-02-21: 3 [IU] via SUBCUTANEOUS
  Administered 2023-02-22 (×3): 5 [IU] via SUBCUTANEOUS
  Administered 2023-02-23: 3 [IU] via SUBCUTANEOUS
  Administered 2023-02-23: 5 [IU] via SUBCUTANEOUS
  Administered 2023-02-23 – 2023-02-24 (×4): 3 [IU] via SUBCUTANEOUS
  Administered 2023-02-25: 2 [IU] via SUBCUTANEOUS
  Administered 2023-02-25: 5 [IU] via SUBCUTANEOUS
  Administered 2023-02-26 – 2023-02-27 (×4): 3 [IU] via SUBCUTANEOUS

## 2023-02-21 MED ORDER — ALLOPURINOL 300 MG PO TABS
150.0000 mg | ORAL_TABLET | Freq: Every day | ORAL | Status: DC
  Administered 2023-02-22 – 2023-02-27 (×5): 150 mg via ORAL
  Filled 2023-02-21 (×5): qty 1

## 2023-02-21 MED ORDER — MORPHINE SULFATE (PF) 2 MG/ML IV SOLN
2.0000 mg | INTRAVENOUS | Status: DC | PRN
  Administered 2023-02-22 – 2023-02-24 (×6): 2 mg via INTRAVENOUS
  Filled 2023-02-21 (×6): qty 1

## 2023-02-21 MED ORDER — ASPIRIN 81 MG PO TBEC
81.0000 mg | DELAYED_RELEASE_TABLET | Freq: Every day | ORAL | Status: DC
  Administered 2023-02-21 – 2023-02-26 (×6): 81 mg via ORAL
  Filled 2023-02-21 (×6): qty 1

## 2023-02-21 MED ORDER — PANTOPRAZOLE SODIUM 40 MG PO TBEC
40.0000 mg | DELAYED_RELEASE_TABLET | Freq: Every day | ORAL | Status: DC
  Administered 2023-02-21 – 2023-02-26 (×6): 40 mg via ORAL
  Filled 2023-02-21 (×6): qty 1

## 2023-02-21 MED ORDER — POTASSIUM CHLORIDE IN NACL 20-0.9 MEQ/L-% IV SOLN
INTRAVENOUS | Status: DC
  Administered 2023-02-21: 1000 mL via INTRAVENOUS
  Filled 2023-02-21 (×2): qty 1000

## 2023-02-21 MED ORDER — ENOXAPARIN SODIUM 40 MG/0.4ML IJ SOSY
40.0000 mg | PREFILLED_SYRINGE | INTRAMUSCULAR | Status: DC
  Administered 2023-02-21 – 2023-02-26 (×6): 40 mg via SUBCUTANEOUS
  Filled 2023-02-21 (×6): qty 0.4

## 2023-02-21 MED ORDER — TRAZODONE HCL 50 MG PO TABS
25.0000 mg | ORAL_TABLET | Freq: Every evening | ORAL | Status: DC | PRN
  Administered 2023-02-21 – 2023-02-25 (×2): 25 mg via ORAL
  Filled 2023-02-21 (×2): qty 1

## 2023-02-21 MED ORDER — ACETAMINOPHEN 325 MG PO TABS
650.0000 mg | ORAL_TABLET | Freq: Four times a day (QID) | ORAL | Status: DC | PRN
  Administered 2023-02-21 – 2023-02-22 (×4): 650 mg via ORAL
  Filled 2023-02-21 (×4): qty 2

## 2023-02-21 MED ORDER — INSULIN ASPART 100 UNIT/ML IJ SOLN
0.0000 [IU] | Freq: Every day | INTRAMUSCULAR | Status: DC
  Administered 2023-02-21: 3 [IU] via SUBCUTANEOUS
  Administered 2023-02-26: 2 [IU] via SUBCUTANEOUS

## 2023-02-21 MED ORDER — ALBUTEROL SULFATE (2.5 MG/3ML) 0.083% IN NEBU: 2.5000 mg | INHALATION_SOLUTION | RESPIRATORY_TRACT | Status: DC | PRN

## 2023-02-21 MED ORDER — LATANOPROST 0.005 % OP SOLN
1.0000 [drp] | Freq: Every day | OPHTHALMIC | Status: DC
  Administered 2023-02-23 – 2023-02-26 (×4): 1 [drp] via OPHTHALMIC
  Filled 2023-02-21: qty 2.5

## 2023-02-21 MED ORDER — LEVOTHYROXINE SODIUM 100 MCG PO TABS
100.0000 ug | ORAL_TABLET | Freq: Every day | ORAL | Status: DC
  Administered 2023-02-22 – 2023-02-27 (×5): 100 ug via ORAL
  Filled 2023-02-21 (×6): qty 1

## 2023-02-21 MED ORDER — PIPERACILLIN-TAZOBACTAM 3.375 G IVPB
3.3750 g | Freq: Three times a day (TID) | INTRAVENOUS | Status: DC
  Administered 2023-02-21 – 2023-02-27 (×18): 3.375 g via INTRAVENOUS
  Filled 2023-02-21 (×18): qty 50

## 2023-02-21 MED ORDER — METOPROLOL TARTRATE 5 MG/5ML IV SOLN: 5.0000 mg | Freq: Four times a day (QID) | INTRAVENOUS | Status: DC | PRN

## 2023-02-21 MED ORDER — ONDANSETRON HCL 4 MG/2ML IJ SOLN: 4.0000 mg | Freq: Four times a day (QID) | INTRAMUSCULAR | Status: DC | PRN

## 2023-02-21 MED ORDER — EZETIMIBE 10 MG PO TABS
10.0000 mg | ORAL_TABLET | Freq: Every day | ORAL | Status: DC
  Administered 2023-02-22 – 2023-02-27 (×5): 10 mg via ORAL
  Filled 2023-02-21 (×5): qty 1

## 2023-02-21 NOTE — H&P (Signed)
History and Physical  Miguel Vasquez JXB:[id number] DOB: 11-Nov-1945 DOA: 02/21/2023  PCP: Benjaman Pott, MD   Chief Complaint: Abdominal pain  HPI: Miguel Vasquez is a 77 y.o. male with medical history significant for CAD status post CABG 2015 with redo CABG April 2023 admitted to the hospital with acute cholecystitis.  He started having acute onset of epigastric abdominal pain radiating to his back about 3 days ago.  There was some associated nausea but no vomiting.  Denies any fevers or chills.  He went to the ER at [facility] last evening, he took all his home medications yesterday morning, including his Plavix.  Currently he does not have any pain or nausea, but says that his epigastric area is sore, from the ER physicians exam.  Reportedly labs at OSH show normal CBC, AST/ALT in the 60s, Tbili 1.1, lipase normal.  He was given IV Zosyn, IV fluids, pain and nausea control, and accepted for hospital admission here due to lack of general surgery coverage at the outlying facility.  Review of Systems: Please see HPI for pertinent positives and negatives. A complete 10 system review of systems are otherwise negative.  Past Medical History:  Diagnosis Date   Aortic insufficiency    a. Mild AI by echo 04/2016.   Basal cell carcinoma, face    Coronary disease    a. stent to Cx 1996. b. stent to RCA 2002. c. s/p CABGx3 in 2015. d.  rotational atherectomy and DESx2 to ostial to mid RCA.   Elevated liver enzymes    GERD (gastroesophageal reflux disease)    H. pylori infection tx'd in the 1970s   Hemochromatosis    History of gout 1990s X 1   Hypercholesterolemia    Hypertension    Controlled   Hypothyroid    Myocardial infarction [facility]) 2002   "mild"   Obesity    Panic disorder    Hx of panic disorder   S/P primary angioplasty with coronary stent and rotational atherectomy of ostial mRCA with DES X 2-- 7/11/7 05/20/2016   Sinus bradycardia    a. h/o marked sinus bradycardia, BB  stopped.   Tachycardia    a. Noted on stress test strips in 2017. ? Atrial flutter. Further w/u underway.   Thrombocytopenia (HCC)    a. per review of labs.   Type II diabetes mellitus [facility])    Past Surgical History:  Procedure Laterality Date   BASAL CELL CARCINOMA EXCISION     "face & lips"   CARDIAC CATHETERIZATION  11//2015   CARDIAC CATHETERIZATION N/A 05/11/2016   Procedure: Left Heart Cath and Cors/Grafts Angiography;  Surgeon: Corky Crafts, MD;  Location: [facility] INVASIVE CV LAB;  Service: Cardiovascular;  Laterality: N/A;   CARDIAC CATHETERIZATION N/A 05/19/2016   Procedure: Coronary/Graft Atherectomy;  Surgeon: Peter M Swaziland, MD;  Location: [facility] INVASIVE CV LAB;  Service: Cardiovascular;  Laterality: N/A;   CARDIAC CATHETERIZATION N/A 05/19/2016   Procedure: Coronary Stent Intervention;  Surgeon: Peter M Swaziland, MD;  Location: [facility] INVASIVE CV LAB;  Service: Cardiovascular;  Laterality: N/A;   CATARACT EXTRACTION W/ INTRAOCULAR LENS  IMPLANT, BILATERAL Bilateral 2016   CORONARY ANGIOPLASTY WITH STENT PLACEMENT  1996; 2002; 05/19/2016   "1 + 2 +2"   CORONARY ARTERY BYPASS GRAFT N/A 10/03/2014   Procedure: CORONARY ARTERY BYPASS GRAFTING (CABG) x  three, using left internal mammary artery and right leg greater saphenous vein harvested endoscopically;  Surgeon: Loreli Slot, MD;  Location: [facility] OR;  Service:  Open Heart Surgery;  Laterality: N/A;   CORONARY ARTERY BYPASS GRAFT N/A 03/05/2022   Procedure: REDO CORONARY ARTERY BYPASS GRAFTING (CABG) X 3 WITH HARVESTED LEFT GREATER SAPHENOUS VEIN AND RIGHT RADIAL ARTERY;  Surgeon: Loreli Slot, MD;  Location: [facility] OR;  Service: Open Heart Surgery;  Laterality: N/A;   ENDOVEIN HARVEST OF GREATER SAPHENOUS VEIN Left 03/05/2022   Procedure: ENDOVEIN HARVEST OF GREATER SAPHENOUS VEIN;  Surgeon: Loreli Slot, MD;  Location: [facility] OR;  Service: Open Heart Surgery;  Laterality: Left;   LEFT HEART CATH AND CORS/GRAFTS ANGIOGRAPHY N/A  02/06/2022   Procedure: LEFT HEART CATH AND CORS/GRAFTS ANGIOGRAPHY;  Surgeon: Swaziland, Peter M, MD;  Location: [facility] INVASIVE CV LAB;  Service: Cardiovascular;  Laterality: N/A;   LEFT HEART CATHETERIZATION WITH CORONARY ANGIOGRAM N/A 09/28/2014   Procedure: LEFT HEART CATHETERIZATION WITH CORONARY ANGIOGRAM;  Surgeon: Lennette Bihari, MD;  Location: [facility] CATH LAB;  Service: Cardiovascular;  Laterality: N/A;   RADIAL ARTERY HARVEST Right 03/05/2022   Procedure: RADIAL ARTERY HARVEST;  Surgeon: Loreli Slot, MD;  Location: [facility] OR;  Service: Open Heart Surgery;  Laterality: Right;   TEE WITHOUT CARDIOVERSION N/A 10/03/2014   Procedure: TRANSESOPHAGEAL ECHOCARDIOGRAM (TEE);  Surgeon: Loreli Slot, MD;  Location: [facility] OR;  Service: Open Heart Surgery;  Laterality: N/A;   TEE WITHOUT CARDIOVERSION N/A 03/05/2022   Procedure: TRANSESOPHAGEAL ECHOCARDIOGRAM (TEE);  Surgeon: Loreli Slot, MD;  Location: [facility] OR;  Service: Open Heart Surgery;  Laterality: N/A;   TONSILLECTOMY  1950s   TRIGGER FINGER RELEASE Bilateral 1990s-2000s    Social History:  reports that he quit smoking about 27 years ago. His smoking use included cigarettes. He started smoking about 27 years ago. He has a 20.00 pack-year smoking history. He has never used smokeless tobacco. He reports current alcohol use of about 6.0 standard drinks of alcohol per week. He reports that he does not use drugs.   Allergies  Allergen Reactions   Statins Other (See Comments)    "Affects liver enzymes"    Family History  Problem Relation Age of Onset   Heart attack Sister    Heart failure Mother    Colon cancer Mother    Colon cancer Brother    GI problems Father      Prior to Admission medications   Medication Sig Start Date End Date Taking? Authorizing Provider  acetaminophen (TYLENOL) 325 MG tablet Take 2 tablets (650 mg total) by mouth every 6 (six) hours. 03/09/22   Leary Roca, PA-C  allopurinol (ZYLOPRIM) 300 MG  tablet Take 150 mg by mouth in the morning.    [provider]  aspirin 81 MG EC tablet Take 81 mg by mouth every evening.    [provider]  Cholecalciferol (VITAMIN D3) 25 MCG (1000 UT) CAPS Take 1,000 Units by mouth every evening.    [provider]  clopidogrel (PLAVIX) 75 MG tablet Take 1 tablet (75 mg total) by mouth daily. 10/29/22   [place]il[place], Peter M, MD  ezetimibe (ZETIA) 10 MG tablet Take 10 mg by mouth daily.      [provider]  folic acid (FOLVITE) 400 MCG tablet Take 400 mcg by mouth in the morning.    [provider]  glipiZIDE (GLUCOTROL XL) 5 MG 24 hr tablet Take 5 mg by mouth daily with breakfast.    [provider]  latanoprost (XALATAN) 0.005 % ophthalmic solution 1 drop at bedtime.    [provider]  levothyroxine (SYNTHROID, LEVOTHROID) 100 MCG tablet Take 100 mcg by mouth daily.      [provider]  metFORMIN (GLUCOPHAGE) 500 MG tablet Take 2 tablets (1,000 mg total) by mouth in the morning and at bedtime. Take 2 tablets by mouth 2 times daily with meal. 02/09/22   [place], Peter M, MD  metoprolol tartrate (LOPRESSOR) 25 MG tablet Take 1 tablet (25 mg total) by mouth 2 (two) times daily. 06/09/22   [place], Peter M, MD  nitroGLYCERIN (NITROSTAT) 0.4 MG SL tablet Place 0.4 mg under the tongue every 5 (five) minutes as needed for chest pain. DISSOLVE ONE TABLET UNDER THE TONGUE EVERY 5 MINUTES AS NEEDED FOR CHEST PAIN.  DO NOT EXCEED A TOTAL OF 3 DOSES IN 15 MINUTES 11/05/14   [provider]  omega-3 acid ethyl esters (LOVAZA) 1 g capsule Take 2 g by mouth 2 (two) times daily.    [provider]  pantoprazole (PROTONIX) 40 MG tablet Take 1 tablet (40 mg total) by mouth daily. Patient taking differently: Take 40 mg by mouth every evening. 09/20/14   Leone Brand, NP  ramipril (ALTACE) 10 MG capsule Take 20 mg by mouth in the morning.    [provider]  rosuvastatin (CRESTOR) 10 MG  tablet Take 10 mg by mouth in the morning.    [provider]    Physical Exam: BP 130/83 (BP Location: Left Arm)   Pulse 99   Temp 97.7 F (36.5 C) (Oral)   General:  Alert, oriented, calm, in no acute distress, resting comfortably Eyes: EOMI, clear conjuctivae, white sclerea Neck: supple, no masses, trachea mildline  Cardiovascular: RRR, no murmurs or rubs, no peripheral edema  Respiratory: clear to auscultation bilaterally, no wheezes, no crackles  Abdomen: soft, tender in the upper quadrants to deep palpation, very slightly distended, normal bowel tones heard  Skin: dry, no rashes  Musculoskeletal: no joint effusions, normal range of motion  Psychiatric: appropriate affect, normal speech  Neurologic: extraocular muscles intact, clear speech, moving all extremities with intact sensorium          Labs on Admission:  Basic Metabolic Panel: No results for input(s): "NA", "K", "CL", "CO2", "GLUCOSE", "BUN", "CREATININE", "CALCIUM", "MG", "PHOS" in the last 168 hours. Liver Function Tests: No results for input(s): "AST", "ALT", "ALKPHOS", "BILITOT", "PROT", "ALBUMIN" in the last 168 hours. No results for input(s): "LIPASE", "AMYLASE" in the last 168 hours. No results for input(s): "AMMONIA" in the last 168 hours. CBC: No results for input(s): "WBC", "NEUTROABS", "HGB", "HCT", "MCV", "PLT" in the last 168 hours. Cardiac Enzymes: No results for input(s): "CKTOTAL", "CKMB", "CKMBINDEX", "TROPONINI" in the last 168 hours.  BNP (last 3 results) No results for input(s): "BNP" in the last 8760 hours.  ProBNP (last 3 results) No results for input(s): "PROBNP" in the last 8760 hours.  CBG: Recent Labs  Lab 02/21/23 1238  GLUCAP 248*    Radiological Exams on Admission: No results found.  Assessment/Plan Principal Problem:   Acute cholecystitis-patient is stable with no evidence of sepsis.  Discussed with general surgery on-call Dr. Corliss Skains. -Inpatient admission -Pain  and nausea control -Okay for clear liquids -Continue empiric IV Zosyn    DM2-diabetic diet when eating, start moderate dose sliding scale     Hypercholesterolemia   Hypertension-Home medications will be continued once reconciled   GERD-PPI   Hypothyroidism-continue Synthroid   Transaminitis-due to his cholecystitis H/O three vessel coronary artery bypass-currently no evidence of coronary syndrome, patient is able  to ambulate up a flight of stairs, and perform his ADLs without shortness of breath, orthopnea, or complaints of chest pain.  Due to his complex cardiac history, general surgery requests cardiology consultation for postoperative risk evaluation. -Continue aspirin, hold Plavix last dose 4/13 a.m. -Recommend contact on-call cardiology in the morning for this nonurgent evaluation.  DVT prophylaxis: Lovenox     Code Status: Full Code  Consults called: None  Admission status: The appropriate patient status for this patient is INPATIENT. Inpatient status is judged to be reasonable and necessary in order to provide the required intensity of service to ensure the patient's safety. The patient's presenting symptoms, physical exam findings, and initial radiographic and laboratory data in the context of their chronic comorbidities is felt to place them at high risk for further clinical deterioration. Furthermore, it is not anticipated that the patient will be medically stable for discharge from the hospital within 2 midnights of admission.    I certify that at the point of admission it is my clinical judgment that the patient will require inpatient hospital care spanning beyond 2 midnights from the point of admission due to high intensity of service, high risk for further deterioration and high frequency of surveillance required   Time spent: 59 minutes  Cletus Mehlhoff Sharlette Dense MD [facility] Pager [phone]  If 7PM-7AM, please contact night-coverage [email or web address] Password  [facility]  02/21/2023, 12:52 PM

## 2023-02-21 NOTE — Consult Note (Signed)
Reason for Consult:Acute cholecystitis Referring Physician: Mir  Marce Charlesworth is an 77 y.o. male.  HPI: This is a 77 year old male with CAD s/p CABG and redo CABG who presented to [facility] with a three-day history of acute onset of epigastric pain radiating through to his back.  Nausea, but no vomiting.  He was evaluated with a CT scan which showed gallstones with mild inflammatory changes.  The patient's cardiologists and heart surgeons are in [place].  There is no general surgery coverage at [facility].  The patient is subsequently transferred, but he was diverted to [facility] due to bed availability.  He is on Plavix and ASA and took his last dose 4/13. WBC 8.5 Hgb 14.3 T. Bili 1.5 AST 66 ALT 61 Lipase 94    Past Medical History:  Diagnosis Date   Aortic insufficiency    a. Mild AI by echo 04/2016.   Basal cell carcinoma, face    Coronary disease    a. stent to Cx 1996. b. stent to RCA 2002. c. s/p CABGx3 in 2015. d.  rotational atherectomy and DESx2 to ostial to mid RCA.   Elevated liver enzymes    GERD (gastroesophageal reflux disease)    H. pylori infection tx'd in the 1970s   Hemochromatosis    History of gout 1990s X 1   Hypercholesterolemia    Hypertension    Controlled   Hypothyroid    Myocardial infarction [facility]) 2002   "mild"   Obesity    Panic disorder    Hx of panic disorder   S/P primary angioplasty with coronary stent and rotational atherectomy of ostial mRCA with DES X 2-- 7/11/7 05/20/2016   Sinus bradycardia    a. h/o marked sinus bradycardia, BB stopped.   Tachycardia    a. Noted on stress test strips in 2017. ? Atrial flutter. Further w/u underway.   Thrombocytopenia (HCC)    a. per review of labs.   Type II diabetes mellitus [facility])     Past Surgical History:  Procedure Laterality Date   BASAL CELL CARCINOMA EXCISION     "face & lips"   CARDIAC CATHETERIZATION  11//2015   CARDIAC CATHETERIZATION N/A 05/11/2016   Procedure: Left Heart Cath and  Cors/Grafts Angiography;  Surgeon: Corky Crafts, MD;  Location: [facility] INVASIVE CV LAB;  Service: Cardiovascular;  Laterality: N/A;   CARDIAC CATHETERIZATION N/A 05/19/2016   Procedure: Coronary/Graft Atherectomy;  Surgeon: Peter M Swaziland, MD;  Location: [facility] INVASIVE CV LAB;  Service: Cardiovascular;  Laterality: N/A;   CARDIAC CATHETERIZATION N/A 05/19/2016   Procedure: Coronary Stent Intervention;  Surgeon: Peter M Swaziland, MD;  Location: [facility] INVASIVE CV LAB;  Service: Cardiovascular;  Laterality: N/A;   CATARACT EXTRACTION W/ INTRAOCULAR LENS  IMPLANT, BILATERAL Bilateral 2016   CORONARY ANGIOPLASTY WITH STENT PLACEMENT  1996; 2002; 05/19/2016   "1 + 2 +2"   CORONARY ARTERY BYPASS GRAFT N/A 10/03/2014   Procedure: CORONARY ARTERY BYPASS GRAFTING (CABG) x  three, using left internal mammary artery and right leg greater saphenous vein harvested endoscopically;  Surgeon: Loreli Slot, MD;  Location: [facility] OR;  Service: Open Heart Surgery;  Laterality: N/A;   CORONARY ARTERY BYPASS GRAFT N/A 03/05/2022   Procedure: REDO CORONARY ARTERY BYPASS GRAFTING (CABG) X 3 WITH HARVESTED LEFT GREATER SAPHENOUS VEIN AND RIGHT RADIAL ARTERY;  Surgeon: Loreli Slot, MD;  Location: [facility] OR;  Service: Open Heart Surgery;  Laterality: N/A;   ENDOVEIN HARVEST OF GREATER SAPHENOUS VEIN Left 03/05/2022  Procedure: ENDOVEIN HARVEST OF GREATER SAPHENOUS VEIN;  Surgeon: Loreli Slot, MD;  Location: [facility] OR;  Service: Open Heart Surgery;  Laterality: Left;   LEFT HEART CATH AND CORS/GRAFTS ANGIOGRAPHY N/A 02/06/2022   Procedure: LEFT HEART CATH AND CORS/GRAFTS ANGIOGRAPHY;  Surgeon: Swaziland, Peter M, MD;  Location: [facility] INVASIVE CV LAB;  Service: Cardiovascular;  Laterality: N/A;   LEFT HEART CATHETERIZATION WITH CORONARY ANGIOGRAM N/A 09/28/2014   Procedure: LEFT HEART CATHETERIZATION WITH CORONARY ANGIOGRAM;  Surgeon: Lennette Bihari, MD;  Location: [facility] CATH LAB;  Service: Cardiovascular;  Laterality: N/A;    RADIAL ARTERY HARVEST Right 03/05/2022   Procedure: RADIAL ARTERY HARVEST;  Surgeon: Loreli Slot, MD;  Location: [facility] OR;  Service: Open Heart Surgery;  Laterality: Right;   TEE WITHOUT CARDIOVERSION N/A 10/03/2014   Procedure: TRANSESOPHAGEAL ECHOCARDIOGRAM (TEE);  Surgeon: Loreli Slot, MD;  Location: [facility] OR;  Service: Open Heart Surgery;  Laterality: N/A;   TEE WITHOUT CARDIOVERSION N/A 03/05/2022   Procedure: TRANSESOPHAGEAL ECHOCARDIOGRAM (TEE);  Surgeon: Loreli Slot, MD;  Location: [facility] OR;  Service: Open Heart Surgery;  Laterality: N/A;   TONSILLECTOMY  1950s   TRIGGER FINGER RELEASE Bilateral 1990s-2000s    Family History  Problem Relation Age of Onset   Heart attack Sister    Heart failure Mother    Colon cancer Mother    Colon cancer Brother    GI problems Father     Social History:  reports that he quit smoking about 27 years ago. His smoking use included cigarettes. He started smoking about 27 years ago. He has a 20.00 pack-year smoking history. He has never used smokeless tobacco. He reports current alcohol use of about 6.0 standard drinks of alcohol per week. He reports that he does not use drugs.  Allergies:  Allergies  Allergen Reactions   Statins Other (See Comments)    "Affects liver enzymes"    Medications:  Prior to Admission medications   Medication Sig Start Date End Date Taking? Authorizing Provider  acetaminophen (TYLENOL) 325 MG tablet Take 2 tablets (650 mg total) by mouth every 6 (six) hours. 03/09/22   Leary Roca, PA-C  allopurinol (ZYLOPRIM) 300 MG tablet Take 150 mg by mouth in the morning.    [provider]  aspirin 81 MG EC tablet Take 81 mg by mouth every evening.    [provider]  Cholecalciferol (VITAMIN D3) 25 MCG (1000 UT) CAPS Take 1,000 Units by mouth every evening.    [provider]  clopidogrel (PLAVIX) 75 MG tablet Take 1 tablet (75 mg total) by mouth daily. 10/29/22   [place],  Peter M, MD  ezetimibe (ZETIA) 10 MG tablet Take 10 mg by mouth daily.      [provider]  folic acid (FOLVITE) 400 MCG tablet Take 400 mcg by mouth in the morning.    [provider]  glipiZIDE (GLUCOTROL XL) 5 MG 24 hr tablet Take 5 mg by mouth daily with breakfast.    [provider]  latanoprost (XALATAN) 0.005 % ophthalmic solution 1 drop at bedtime.    [provider]  levothyroxine (SYNTHROID, LEVOTHROID) 100 MCG tablet Take 100 mcg by mouth daily.      [provider]  metFORMIN (GLUCOPHAGE) 500 MG tablet Take 2 tablets (1,000 mg total) by mouth in the morning and at bedtime. Take 2 tablets by mouth 2 times daily with meal. 02/09/22   [place], Peter M, MD  metoprolol tartrate (LOPRESSOR) 25 MG  tablet Take 1 tablet (25 mg total) by mouth 2 (two) times daily. 06/09/22   [place], Peter M, MD  nitroGLYCERIN (NITROSTAT) 0.4 MG SL tablet Place 0.4 mg under the tongue every 5 (five) minutes as needed for chest pain. DISSOLVE ONE TABLET UNDER THE TONGUE EVERY 5 MINUTES AS NEEDED FOR CHEST PAIN.  DO NOT EXCEED A TOTAL OF 3 DOSES IN 15 MINUTES 11/05/14   [provider]  omega-3 acid ethyl esters (LOVAZA) 1 g capsule Take 2 g by mouth 2 (two) times daily.    [provider]  pantoprazole (PROTONIX) 40 MG tablet Take 1 tablet (40 mg total) by mouth daily. Patient taking differently: Take 40 mg by mouth every evening. 09/20/14   Leone Brand, NP  ramipril (ALTACE) 10 MG capsule Take 20 mg by mouth in the morning.    [provider]  rosuvastatin (CRESTOR) 10 MG tablet Take 10 mg by mouth in the morning.    [provider]     EXAM: CT ABDOMEN PELVIS W CONTRAST ACCESSION: [id number] CH CLINICAL INDICATION: 77 years old with RUQ pain  COMPARISON: None  TECHNIQUE: A helical CT scan of the abdomen and pelvis was obtained following IV contrast from the lung bases through the pubic symphysis. Images were reconstructed in the  axial plane. Coronal and sagittal reformatted images were also provided for further evaluation.   FINDINGS:  LOWER CHEST: Unremarkable.  LIVER: Normal liver contour. No focal liver lesions.  BILIARY: Cholelithiasis. Mild inflammatory changes surrounding the gallbladder. Visualized gallstone which appears to be in the proximal cystic duct (6:52). The gallbladder wall is intact. intrahepatic and common bile duct is normal in caliber.  SPLEEN: Normal in size and contour.  PANCREAS: Normal pancreatic contour. No focal lesions. No ductal dilation.  ADRENAL GLANDS: Normal appearance of the adrenal glands.  KIDNEYS/URETERS: Symmetric renal enhancement. No hydronephrosis. No solid renal mass. Right lower pole renal cyst.  BLADDER: Unremarkable.  REPRODUCTIVE ORGANS: Unremarkable.  GI TRACT: No findings of bowel obstruction or acute inflammation. Normal appendix.  PERITONEUM, RETROPERITONEUM AND MESENTERY: No free air. No ascites. No fluid collection.  LYMPH NODES: Prominent portacaval lymph node measuring up to 1.3 cm. Additional prominent lymph node adjacent to the gallbladder measuring 0.6 cm (2:40).  VESSELS: Hepatic and portal veins are patent. Scattered atherosclerotic calcifications throughout the abdominal aorta and its branches. Normal caliber aorta.  BONES and SOFT TISSUES: Mild degenerative changes of the spine. No focal soft tissue lesions.   IMPRESSION: Cholecystitis with radiopaque stone in the gallbladder neck. No CT findings of gangrenous or emphysematous cholecystitis.   Review of Systems  Constitutional:  Positive for appetite change and fever.  HENT:  Negative for ear discharge, ear pain, hearing loss and tinnitus.   Eyes:  Negative for photophobia and pain.  Respiratory:  Negative for cough and shortness of breath.   Cardiovascular:  Negative for chest pain.  Gastrointestinal:  Positive for abdominal pain and nausea. Negative for vomiting.  Genitourinary:   Negative for dysuria, flank pain, frequency and urgency.  Musculoskeletal:  Negative for back pain, myalgias and neck pain.  Neurological:  Negative for dizziness and headaches.  Hematological:  Does not bruise/bleed easily.  Psychiatric/Behavioral:  The patient is not nervous/anxious.    Blood pressure 130/83, pulse 99, temperature 97.7 F (36.5 C), temperature source Oral. Physical Exam Constitutional:  WDWN in NAD, conversant, no obvious deformities; lying in bed comfortably Eyes:  Pupils equal, round; sclera anicteric; moist conjunctiva; no lid lag HENT:  Oral mucosa moist; good dentition  Neck:  No masses palpated, trachea midline; no thyromegaly Lungs:  CTA bilaterally; normal respiratory effort CV:  Regular rate and rhythm; no murmurs; extremities well-perfused with no edema Abd:  +bowel sounds, soft, moderately tender in upper abdomen; no palpable organomegaly; no palpable hernias Musc:  Unable to assess gait; no apparent clubbing or cyanosis in extremities Lymphatic:  No palpable cervical or axillary lymphadenopathy Skin:  Warm, dry; no sign of jaundice Psychiatric - alert and oriented x 4; calm mood and affect  Assessment/Plan: Acute calculus cholecystitis  Hold Plavix and Aspirin Cardiac clearance for general anesthesia for laparoscopic cholecystectomy Clear liquids - do not advance diet IV antibiotics for acute cholecystitis The [facility] surgeon this week will determine the timing of eventual laparoscopic cholecystectomy.  Wilmon Arms Buford Bremer 02/21/2023, 12:09 PM

## 2023-02-22 DIAGNOSIS — Z01818 Encounter for other preprocedural examination: Secondary | ICD-10-CM | POA: Diagnosis not present

## 2023-02-22 DIAGNOSIS — K810 Acute cholecystitis: Secondary | ICD-10-CM | POA: Diagnosis not present

## 2023-02-22 DIAGNOSIS — I2510 Atherosclerotic heart disease of native coronary artery without angina pectoris: Secondary | ICD-10-CM | POA: Diagnosis not present

## 2023-02-22 DIAGNOSIS — Z951 Presence of aortocoronary bypass graft: Secondary | ICD-10-CM | POA: Diagnosis not present

## 2023-02-22 DIAGNOSIS — I2583 Coronary atherosclerosis due to lipid rich plaque: Secondary | ICD-10-CM

## 2023-02-22 DIAGNOSIS — E7800 Pure hypercholesterolemia, unspecified: Secondary | ICD-10-CM | POA: Diagnosis not present

## 2023-02-22 DIAGNOSIS — I10 Essential (primary) hypertension: Secondary | ICD-10-CM

## 2023-02-22 DIAGNOSIS — R7401 Elevation of levels of liver transaminase levels: Secondary | ICD-10-CM

## 2023-02-22 LAB — GLUCOSE, CAPILLARY
Glucose-Capillary: 246 mg/dL — ABNORMAL HIGH (ref 70–99)
Glucose-Capillary: 204 mg/dL — ABNORMAL HIGH (ref 70–99)
Glucose-Capillary: 236 mg/dL — ABNORMAL HIGH (ref 70–99)
Glucose-Capillary: 187 mg/dL — ABNORMAL HIGH (ref 70–99)

## 2023-02-22 LAB — COMPREHENSIVE METABOLIC PANEL
ALT: 35 U/L (ref 0–44)
AST: 44 U/L — ABNORMAL HIGH (ref 15–41)
Albumin: 3.3 g/dL — ABNORMAL LOW (ref 3.5–5.0)
Alkaline Phosphatase: 50 U/L (ref 38–126)
Anion gap: 14 (ref 5–15)
BUN: 12 mg/dL (ref 8–23)
CO2: 22 mmol/L (ref 22–32)
Calcium: 8.8 mg/dL — ABNORMAL LOW (ref 8.9–10.3)
Chloride: 95 mmol/L — ABNORMAL LOW (ref 98–111)
Creatinine, Ser: 0.85 mg/dL (ref 0.61–1.24)
GFR, Estimated: >60 mL/min (ref >60)
Glucose, Bld: 251 mg/dL — ABNORMAL HIGH (ref 70–99)
Potassium: 3.5 mmol/L (ref 3.5–5.1)
Sodium: 131 mmol/L — ABNORMAL LOW (ref 135–145)
Total Bilirubin: 2 mg/dL — ABNORMAL HIGH (ref 0.3–1.2)
Total Protein: 6.5 g/dL (ref 6.5–8.1)

## 2023-02-22 LAB — CBC
HCT: 41.9 % (ref 39.0–52.0)
Hemoglobin: 14.1 g/dL (ref 13.0–17.0)
MCH: 30.3 pg (ref 26.0–34.0)
MCHC: 33.7 g/dL (ref 30.0–36.0)
MCV: 90.1 fL (ref 80.0–100.0)
Platelets: 73 10*3/uL — ABNORMAL LOW (ref 150–400)
RBC: 4.65 MIL/uL (ref 4.22–5.81)
RDW: 13.2 % (ref 11.5–15.5)
WBC: 6.6 10*3/uL (ref 4.0–10.5)
nRBC: 0 % (ref 0.0–0.2)

## 2023-02-22 LAB — LIPASE, BLOOD: Lipase: 20 U/L (ref 11–51)

## 2023-02-22 MED ORDER — RAMIPRIL 5 MG PO CAPS
20.0000 mg | ORAL_CAPSULE | Freq: Every day | ORAL | Status: DC
  Administered 2023-02-23 – 2023-02-24 (×2): 20 mg via ORAL
  Filled 2023-02-22 (×2): qty 4

## 2023-02-22 MED ORDER — METOPROLOL TARTRATE 25 MG PO TABS
25.0000 mg | ORAL_TABLET | Freq: Two times a day (BID) | ORAL | Status: DC
  Administered 2023-02-22 – 2023-02-27 (×10): 25 mg via ORAL
  Filled 2023-02-22 (×11): qty 1

## 2023-02-22 MED ORDER — SODIUM CHLORIDE 0.9 % IV SOLN: INTRAVENOUS | Status: DC

## 2023-02-22 NOTE — Assessment & Plan Note (Signed)
-   Continue SSI and CBG monitoring ?

## 2023-02-22 NOTE — Progress Notes (Addendum)
Subjective: CC: Continued pain in epigastrium/ruq. No n/v. Tolerating cld. No lower abdominal pain. No cp or sob. Last took plavix Saturday am.   No prior abdominal surgeries. Hx gastric ulcer in his 20's. On PPI daily. Denies frequent nsaid use.   Reports he is able to walk 1.5 miles without cp or sob.   Objective: Vital signs in last 24 hours: Temp:  [97.6 F (36.4 C)-98.6 F (37 C)] 97.7 F (36.5 C) (04/15 0445) Pulse Rate:  [99-105] 101 (04/15 0445) Resp:  [16-18] 16 (04/15 0445) BP: (126-131)/(76-87) 126/80 (04/15 0445) SpO2:  [95 %-96 %] 96 % (04/15 0445) Weight:  [98.4 kg] 98.4 kg (04/14 1306) Last BM Date : 02/20/23  Intake/Output from previous day: 04/14 0701 - 04/15 0700 In: 350 [P.O.:300; IV Piggyback:50] Out: 610 [Urine:610] Intake/Output this shift: No intake/output data recorded.  PE: Gen:  Alert, NAD, pleasant Pulm:  Rate and effort normal Abd: Soft, ND, epigastric and RUQ ttp. +BS Psych: A&Ox3  Lab Results:  Recent Labs    02/21/23 1557  WBC 8.2  HGB 15.1  HCT 44.3  PLT 117*   BMET Recent Labs    02/21/23 1557  NA 132*  K 3.8  CL 97*  CO2 23  GLUCOSE 269*  BUN 8  CREATININE 0.90  CALCIUM 9.1   PT/INR Recent Labs    02/21/23 1557  LABPROT 15.1  INR 1.2   CMP     Component Value Date/Time   NA 132 (L) 02/21/2023 1557   NA 138 02/04/2022 1104   K 3.8 02/21/2023 1557   CL 97 (L) 02/21/2023 1557   CO2 23 02/21/2023 1557   GLUCOSE 269 (H) 02/21/2023 1557   BUN 8 02/21/2023 1557   BUN 14 02/04/2022 1104   CREATININE 0.90 02/21/2023 1557   CREATININE 0.78 05/07/2016 0736   CALCIUM 9.1 02/21/2023 1557   PROT 7.2 02/21/2023 1557   PROT 6.6 02/04/2022 1104   ALBUMIN 3.9 02/21/2023 1557   ALBUMIN 4.6 02/04/2022 1104   AST 51 (H) 02/21/2023 1557   ALT 41 02/21/2023 1557   ALKPHOS 50 02/21/2023 1557   BILITOT 2.6 (H) 02/21/2023 1557   BILITOT 0.9 02/04/2022 1104   GFRNONAA >60 02/21/2023 1557   GFRAA >60 05/20/2016  0550   Lipase  No results found for: "LIPASE"  Studies/Results: No results found.       Anti-infectives: Anti-infectives (From admission, onward)    Start     Dose/Rate Route Frequency Ordered Stop   02/21/23 1300  piperacillin-tazobactam (ZOSYN) IVPB 3.375 g        3.375 g 12.5 mL/hr over 240 Minutes Intravenous Every 8 hours 02/21/23 1141          Assessment/Plan Acute Cholecystitis - CT from outside hospital reviewed and c/w acute cholecystitis - Cont abx - Trend LFT's. T. Bili 2.6 on admission. Downtrending. Continue to monitor. - Lipase elevated at 122 at [facility] > normalized  - Hold plavix, last dose 4/13am - Cardiology consult for preoperative evaluation/clearance - If cleared by cardiology will plan for lap chole during admission once Plavix has had enough time to wash out (likely wait full 5d with plt count of 73 this am)  FEN - Okay for CLD. 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   LOS: 1 day    Jacinto Halim , [facility] 02/22/2023, 7:56 AM Please see Amion for pager number during day hours 7:00am-4:30pm

## 2023-02-22 NOTE — Assessment & Plan Note (Signed)
-   noted on CT A/P at [facility] - general surgery following for CCY timing; plavix on hold -Cardiology consulted for clearance as well, appreciate assistance - Continue pain and nausea control -Tentative plan is for surgery on Thursday after full Plavix washout

## 2023-02-22 NOTE — Progress Notes (Signed)
Progress Note    Miguel Vasquez   ZOX:[id number]  DOB: Sep 18, 1946  DOA: 02/21/2023     1 PCP: Benjaman Pott, MD  Initial CC: abd pain  Hospital Course: Mr. Budai is a 77 yo male with PMH CAD s/p CABG x 2, HTN, HLD, hypothyroidism, DM II who presented as a transfer from [facility] for acute cholecystitis.  He initially presented there with abdominal pain and nausea but no vomiting. CT abdomen/pelvis was performed on 02/20/2023 which showed cholelithiasis with mild inflammatory changes surrounding the gallbladder and a gallstone visualized in the proximal cystic duct.  He was transferred for further surgery evaluation and possible cholecystectomy.  Cardiology was also consulted on admission for cardiac clearance.  Interval History:  Transferred for further general surgery evaluation for cholecystectomy regarding acute cholecystitis. Still having abdominal pain when seen this morning but no vomiting noted.  Assessment and Plan: * Acute cholecystitis - noted on CT A/P at [facility] - general surgery following for CCY timing; plavix on hold -Cardiology consulted for clearance as well, appreciate assistance - Continue pain and nausea control  Transaminitis - Mildly elevated AST and total bili - Follow for improvement pending cholecystectomy  H/O three vessel coronary artery bypass - CABG in 2015 followed by redo in April 2023 -Cardiology consult requested for clearance given cardiac history - plavix on hold - continue asa, zetia, lopressor, ramipril  Hypertension - Continue ramipril and metoprolol (currently tachycardic; will see how he tolerates BB given cardiology note of hx bradycardia on BB)  DMII (diabetes mellitus, type 2) - Continue SSI and CBG monitoring  Hypercholesterolemia - statin intolerant per allergy list - on zetia at home   Old records reviewed in assessment of this patient  Antimicrobials: Zosyn 02/21/2023 >> current  DVT prophylaxis:   enoxaparin (LOVENOX) injection 40 mg Start: 02/21/23 2200 SCDs Start: 02/21/23 1220   Code Status:   Code Status: Full Code  Mobility Assessment (last 72 hours)     Mobility Assessment     Row Name 02/22/23 0830 02/21/23 2130 02/21/23 1252       Does patient have an order for bedrest or is patient medically unstable No - Continue assessment No - Continue assessment No - Continue assessment     What is the highest level of mobility based on the progressive mobility assessment? Level 5 (Walks with assist in room/hall) - Balance while stepping forward/back and can walk in room with assist - Complete Level 5 (Walks with assist in room/hall) - Balance while stepping forward/back and can walk in room with assist - Complete Level 5 (Walks with assist in room/hall) - Balance while stepping forward/back and can walk in room with assist - Complete              Barriers to discharge: none Disposition Plan:  Home 3-4 days Status is: Inpt  Objective: Blood pressure 126/80, pulse (!) 101, temperature 97.7 F (36.5 C), temperature source Oral, resp. rate 16, height  (1.854 m), weight 98.4 kg, SpO2 96 %.  Examination:  Physical Exam Constitutional:      General: He is not in acute distress.    Appearance: Normal appearance.  HENT:     Head: Normocephalic and atraumatic.     Mouth/Throat:     Mouth: Mucous membranes are moist.  Eyes:     Extraocular Movements: Extraocular movements intact.  Cardiovascular:     Rate and Rhythm: Normal rate and regular rhythm.     Heart sounds: Normal  heart sounds.  Pulmonary:     Effort: Pulmonary effort is normal. No respiratory distress.     Breath sounds: Normal breath sounds. No wheezing.  Abdominal:     General: Bowel sounds are normal. There is no distension.     Palpations: Abdomen is soft.     Tenderness: There is abdominal tenderness (generalized but worse in right sided quadrants).  Musculoskeletal:        General: Normal range of  motion.     Cervical back: Normal range of motion and neck supple.  Skin:    General: Skin is warm and dry.  Neurological:     General: No focal deficit present.     Mental Status: He is alert.  Psychiatric:        Mood and Affect: Mood normal.        Behavior: Behavior normal.      Consultants:  General surgery Cardiology  Procedures:    Data Reviewed: Results for orders placed or performed during the hospital encounter of 02/21/23 (from the past 24 hour(s))  CBC     Status: Abnormal   Collection Time: 02/21/23  3:57 PM  Result Value Ref Range   WBC 8.2 4.0 - 10.5 K/uL   RBC 4.96 4.22 - 5.81 MIL/uL   Hemoglobin 15.1 13.0 - 17.0 g/dL   HCT 77.1 16.5 - 79.0 %   MCV 89.3 80.0 - 100.0 fL   MCH 30.4 26.0 - 34.0 pg   MCHC 34.1 30.0 - 36.0 g/dL   RDW 38.3 33.8 - 32.9 %   Platelets 117 (L) 150 - 400 K/uL   nRBC 0.0 0.0 - 0.2 %  Comprehensive metabolic panel     Status: Abnormal   Collection Time: 02/21/23  3:57 PM  Result Value Ref Range   Sodium 132 (L) 135 - 145 mmol/L   Potassium 3.8 3.5 - 5.1 mmol/L   Chloride 97 (L) 98 - 111 mmol/L   CO2 23 22 - 32 mmol/L   Glucose, Bld 269 (H) 70 - 99 mg/dL   BUN 8 8 - 23 mg/dL   Creatinine, Ser 1.91 0.61 - 1.24 mg/dL   Calcium 9.1 8.9 - 66.0 mg/dL   Total Protein 7.2 6.5 - 8.1 g/dL   Albumin 3.9 3.5 - 5.0 g/dL   AST 51 (H) 15 - 41 U/L   ALT 41 0 - 44 U/L   Alkaline Phosphatase 50 38 - 126 U/L   Total Bilirubin 2.6 (H) 0.3 - 1.2 mg/dL   GFR, Estimated >60 >04 mL/min   Anion gap 12 5 - 15  Protime-INR     Status: None   Collection Time: 02/21/23  3:57 PM  Result Value Ref Range   Prothrombin Time 15.1 11.4 - 15.2 seconds   INR 1.2 0.8 - 1.2  Hemoglobin A1c     Status: Abnormal   Collection Time: 02/21/23  3:57 PM  Result Value Ref Range   Hgb A1c MFr Bld 8.6 (H) 4.8 - 5.6 %   Mean Plasma Glucose 200.12 mg/dL  Glucose, capillary     Status: Abnormal   Collection Time: 02/21/23  4:22 PM  Result Value Ref Range    Glucose-Capillary 253 (H) 70 - 99 mg/dL  Glucose, capillary     Status: Abnormal   Collection Time: 02/21/23  8:42 PM  Result Value Ref Range   Glucose-Capillary 272 (H) 70 - 99 mg/dL   Comment 1 Notify RN    Comment 2 Document in Chart  Glucose, capillary     Status: Abnormal   Collection Time: 02/22/23  7:56 AM  Result Value Ref Range   Glucose-Capillary 246 (H) 70 - 99 mg/dL  CBC     Status: Abnormal   Collection Time: 02/22/23  8:14 AM  Result Value Ref Range   WBC 6.6 4.0 - 10.5 K/uL   RBC 4.65 4.22 - 5.81 MIL/uL   Hemoglobin 14.1 13.0 - 17.0 g/dL   HCT 16.1 09.6 - 04.5 %   MCV 90.1 80.0 - 100.0 fL   MCH 30.3 26.0 - 34.0 pg   MCHC 33.7 30.0 - 36.0 g/dL   RDW 40.9 81.1 - 91.4 %   Platelets 73 (L) 150 - 400 K/uL   nRBC 0.0 0.0 - 0.2 %  Comprehensive metabolic panel     Status: Abnormal   Collection Time: 02/22/23  8:14 AM  Result Value Ref Range   Sodium 131 (L) 135 - 145 mmol/L   Potassium 3.5 3.5 - 5.1 mmol/L   Chloride 95 (L) 98 - 111 mmol/L   CO2 22 22 - 32 mmol/L   Glucose, Bld 251 (H) 70 - 99 mg/dL   BUN 12 8 - 23 mg/dL   Creatinine, Ser 7.82 0.61 - 1.24 mg/dL   Calcium 8.8 (L) 8.9 - 10.3 mg/dL   Total Protein 6.5 6.5 - 8.1 g/dL   Albumin 3.3 (L) 3.5 - 5.0 g/dL   AST 44 (H) 15 - 41 U/L   ALT 35 0 - 44 U/L   Alkaline Phosphatase 50 38 - 126 U/L   Total Bilirubin 2.0 (H) 0.3 - 1.2 mg/dL   GFR, Estimated >95 >62 mL/min   Anion gap 14 5 - 15  Lipase, blood     Status: None   Collection Time: 02/22/23  8:14 AM  Result Value Ref Range   Lipase 20 11 - 51 U/L  Glucose, capillary     Status: Abnormal   Collection Time: 02/22/23 11:58 AM  Result Value Ref Range   Glucose-Capillary 204 (H) 70 - 99 mg/dL   Comment 1 Notify RN    Comment 2 Document in Chart     I have reviewed pertinent nursing notes, vitals, labs, and images as necessary. I have ordered labwork to follow up on as indicated.  I have reviewed the last notes from staff over past 24 hours. I have  discussed patient's care plan and test results with nursing staff, CM/SW, and other staff as appropriate.  Time spent: Greater than 50% of the 55 minute visit was spent in counseling/coordination of care for the patient as laid out in the A&P.   LOS: 1 day   Lewie Chamber, MD [facility] 02/22/2023, 1:06 PM

## 2023-02-22 NOTE — Hospital Course (Signed)
Miguel Vasquez is a 77 yo male with PMH CAD s/p CABG x 2, HTN, HLD, hypothyroidism, DM II who presented as a transfer from [facility] for acute cholecystitis.  He initially presented there with abdominal pain and nausea but no vomiting. CT abdomen/pelvis was performed on 02/20/2023 which showed cholelithiasis with mild inflammatory changes surrounding the gallbladder and a gallstone visualized in the proximal cystic duct.  He was transferred for further surgery evaluation and possible cholecystectomy.  Cardiology was also consulted on admission for cardiac clearance.

## 2023-02-22 NOTE — Assessment & Plan Note (Signed)
-   Mildly elevated AST and total bili - Follow for improvement pending cholecystectomy

## 2023-02-22 NOTE — Consult Note (Addendum)
Cardiology Consultation   Patient ID: Miguel Vasquez MRN: [id number]; DOB: December 27, 1945  Admit date: 02/21/2023 Date of Consult: 02/22/2023  PCP:  Benjaman Pott, MD   [facility] Providers Cardiologist:  Peter Swaziland, MD        Patient Profile:   Miguel Vasquez is a 77 y.o. male with a hx of HTN, HLD, DM-2, CAD with prior stents CABG 2015 and redo CABG 02/2022 for occlusion of prior bypass grafts including LIMA, underwent redo CABG and has done well, who is being seen 02/22/2023 for the evaluation of pre-op eval at the request of DR Girguis.  History of Present Illness:   Mr. [name] with hx as above and several stents prior to CABG in 2015, post CABG he had complex rotational atherectomy and stenting of RCA 05/2016, for failed VG to RCA.   Last cath 01/2022 with occlusion of all prior bypass grafts and 3 vessel disease.-EF was normal without significant valvular disease.   RE-do CABG  X 3 03/05/22 with rt radial to LAD, VG to OM and VG to PL.  Hx of thrombocytopenia. He coninted with plavix to help with graft patency.  He is on statin.   Pt was admitted yesterday for acute cholecystitis, began with epigastric abd pain radiation to his back about 3 days ago.  + nausea no vomiting.  Last dose of Plavix 02/20/23.   At [facility] AST/ALT elevated in the 60s. Tbili 1.1, given IV antibiotics and transferred to [facility]. No evidence of sepsis.  No chest pain. His plavix was held and asa continued. Though surgery has stopped ASA.  Plan for general anesthesia for lap chole.    EKG:  The EKG was personally reviewed and demonstrates:  SR though during the night up to 149 Telemetry:  Telemetry was personally reviewed and demonstrates:  SR to ST  Na 131 K+ 3.5 Glucose 251 BUN 12 Cr 0.85  WBC 6.6 Hgb 14.1 plts 73 down from 117  Troponin <0.034 A1c was 8.6   BP 126/80 P 109 R 16 temp 97.7   No chest pain, no SOB.  Is active prior to this issue, can walk up a flight or 2 of stairs without pain or  SOB,  mows grass on riding mower.     Past Medical History:  Diagnosis Date   Aortic insufficiency    a. Mild AI by echo 04/2016.   Basal cell carcinoma, face    Coronary disease    a. stent to Cx 1996. b. stent to RCA 2002. c. s/p CABGx3 in 2015. d.  rotational atherectomy and DESx2 to ostial to mid RCA.   Elevated liver enzymes    GERD (gastroesophageal reflux disease)    H. pylori infection tx'd in the 1970s   Hemochromatosis    History of gout 1990s X 1   Hypercholesterolemia    Hypertension    Controlled   Hypothyroid    Myocardial infarction 2002   "mild"   Obesity    Panic disorder    Hx of panic disorder   S/P primary angioplasty with coronary stent and rotational atherectomy of ostial mRCA with DES X 2-- 7/11/7 05/20/2016   Sinus bradycardia    a. h/o marked sinus bradycardia, BB stopped.   Tachycardia    a. Noted on stress test strips in 2017. ? Atrial flutter. Further w/u underway.   Thrombocytopenia    a. per review of labs.   Type II diabetes mellitus     Past Surgical  History:  Procedure Laterality Date   BASAL CELL CARCINOMA EXCISION     "face & lips"   CARDIAC CATHETERIZATION  11//2015   CARDIAC CATHETERIZATION N/A 05/11/2016   Procedure: Left Heart Cath and Cors/Grafts Angiography;  Surgeon: Corky Crafts, MD;  Location: [facility] INVASIVE CV LAB;  Service: Cardiovascular;  Laterality: N/A;   CARDIAC CATHETERIZATION N/A 05/19/2016   Procedure: Coronary/Graft Atherectomy;  Surgeon: Peter M Swaziland, MD;  Location: [facility] INVASIVE CV LAB;  Service: Cardiovascular;  Laterality: N/A;   CARDIAC CATHETERIZATION N/A 05/19/2016   Procedure: Coronary Stent Intervention;  Surgeon: Peter M Swaziland, MD;  Location: [facility] INVASIVE CV LAB;  Service: Cardiovascular;  Laterality: N/A;   CATARACT EXTRACTION W/ INTRAOCULAR LENS  IMPLANT, BILATERAL Bilateral 2016   CORONARY ANGIOPLASTY WITH STENT PLACEMENT  1996; 2002; 05/19/2016   "1 + 2 +2"   CORONARY ARTERY BYPASS GRAFT N/A 10/03/2014    Procedure: CORONARY ARTERY BYPASS GRAFTING (CABG) x  three, using left internal mammary artery and right leg greater saphenous vein harvested endoscopically;  Surgeon: Loreli Slot, MD;  Location: [facility] OR;  Service: Open Heart Surgery;  Laterality: N/A;   CORONARY ARTERY BYPASS GRAFT N/A 03/05/2022   Procedure: REDO CORONARY ARTERY BYPASS GRAFTING (CABG) X 3 WITH HARVESTED LEFT GREATER SAPHENOUS VEIN AND RIGHT RADIAL ARTERY;  Surgeon: Loreli Slot, MD;  Location: [facility] OR;  Service: Open Heart Surgery;  Laterality: N/A;   ENDOVEIN HARVEST OF GREATER SAPHENOUS VEIN Left 03/05/2022   Procedure: ENDOVEIN HARVEST OF GREATER SAPHENOUS VEIN;  Surgeon: Loreli Slot, MD;  Location: [facility] OR;  Service: Open Heart Surgery;  Laterality: Left;   LEFT HEART CATH AND CORS/GRAFTS ANGIOGRAPHY N/A 02/06/2022   Procedure: LEFT HEART CATH AND CORS/GRAFTS ANGIOGRAPHY;  Surgeon: Swaziland, Peter M, MD;  Location: [facility] INVASIVE CV LAB;  Service: Cardiovascular;  Laterality: N/A;   LEFT HEART CATHETERIZATION WITH CORONARY ANGIOGRAM N/A 09/28/2014   Procedure: LEFT HEART CATHETERIZATION WITH CORONARY ANGIOGRAM;  Surgeon: Lennette Bihari, MD;  Location: [facility] CATH LAB;  Service: Cardiovascular;  Laterality: N/A;   RADIAL ARTERY HARVEST Right 03/05/2022   Procedure: RADIAL ARTERY HARVEST;  Surgeon: Loreli Slot, MD;  Location: [facility] OR;  Service: Open Heart Surgery;  Laterality: Right;   TEE WITHOUT CARDIOVERSION N/A 10/03/2014   Procedure: TRANSESOPHAGEAL ECHOCARDIOGRAM (TEE);  Surgeon: Loreli Slot, MD;  Location: [facility] OR;  Service: Open Heart Surgery;  Laterality: N/A;   TEE WITHOUT CARDIOVERSION N/A 03/05/2022   Procedure: TRANSESOPHAGEAL ECHOCARDIOGRAM (TEE);  Surgeon: Loreli Slot, MD;  Location: [facility] OR;  Service: Open Heart Surgery;  Laterality: N/A;   TONSILLECTOMY  1950s   TRIGGER FINGER RELEASE Bilateral 1990s-2000s     Home Medications:  Prior to Admission medications   Medication Sig  Start Date End Date Taking? Authorizing Provider  acetaminophen (TYLENOL) 325 MG tablet Take 2 tablets (650 mg total) by mouth every 6 (six) hours. 03/09/22  Yes Roddenberry, Cecille Amsterdam, PA-C  allopurinol (ZYLOPRIM) 300 MG tablet Take 150 mg by mouth in the morning.   Yes [provider]  aspirin 81 MG EC tablet Take 81 mg by mouth every evening.   Yes [provider]  Cholecalciferol (VITAMIN D3) 25 MCG (1000 UT) CAPS Take 1,000 Units by mouth every evening.   Yes [provider]  clopidogrel (PLAVIX) 75 MG tablet Take 1 tablet (75 mg total) by mouth daily. 10/29/22  Yes Swaziland, Peter M, MD  ezetimibe (ZETIA) 10 MG tablet Take 10 mg by mouth  daily.     Yes [provider]  folic acid (FOLVITE) 400 MCG tablet Take 400 mcg by mouth in the morning.   Yes [provider]  glipiZIDE (GLUCOTROL XL) 5 MG 24 hr tablet Take 5 mg by mouth daily with breakfast.   Yes [provider]  latanoprost (XALATAN) 0.005 % ophthalmic solution Place 1 drop into both eyes at bedtime.   Yes [provider]  levothyroxine (SYNTHROID, LEVOTHROID) 100 MCG tablet Take 100 mcg by mouth daily.     Yes [provider]  metFORMIN (GLUCOPHAGE) 500 MG tablet Take 2 tablets (1,000 mg total) by mouth in the morning and at bedtime. Take 2 tablets by mouth 2 times daily with meal. 02/09/22  Yes Swaziland, Peter M, MD  metoprolol tartrate (LOPRESSOR) 25 MG tablet Take 1 tablet (25 mg total) by mouth 2 (two) times daily. 06/09/22  Yes Swaziland, Peter M, MD  omega-3 acid ethyl esters (LOVAZA) 1 g capsule Take 2 g by mouth 2 (two) times daily.   Yes [provider]  pantoprazole (PROTONIX) 40 MG tablet Take 1 tablet (40 mg total) by mouth daily. Patient taking differently: Take 40 mg by mouth every evening. 09/20/14  Yes Leone Brand, NP  ramipril (ALTACE) 10 MG capsule Take 20 mg by mouth in the morning.   Yes [provider]  rosuvastatin (CRESTOR) 10 MG  tablet Take 10 mg by mouth in the morning.   Yes [provider]  nitroGLYCERIN (NITROSTAT) 0.4 MG SL tablet Place 0.4 mg under the tongue every 5 (five) minutes as needed for chest pain. DISSOLVE ONE TABLET UNDER THE TONGUE EVERY 5 MINUTES AS NEEDED FOR CHEST PAIN.  DO NOT EXCEED A TOTAL OF 3 DOSES IN 15 MINUTES 11/05/14   [provider]    Inpatient Medications: Scheduled Meds:  allopurinol  150 mg Oral Daily   aspirin EC  81 mg Oral QHS   enoxaparin (LOVENOX) injection  40 mg Subcutaneous Q24H   ezetimibe  10 mg Oral Daily   insulin aspart  0-15 Units Subcutaneous TID WC   insulin aspart  0-5 Units Subcutaneous QHS   latanoprost  1 drop Both Eyes QHS   levothyroxine  100 mcg Oral QAC breakfast   pantoprazole  40 mg Oral QHS   Continuous Infusions:  0.9 % NaCl with KCl 20 mEq / L 50 mL/hr at 02/22/23 0015   piperacillin-tazobactam (ZOSYN)  IV 3.375 g (02/22/23 0603)   PRN Meds: acetaminophen, albuterol, metoprolol tartrate, morphine injection, ondansetron (ZOFRAN) IV, traZODone  Allergies:    Allergies  Allergen Reactions   Statins Other (See Comments)    "Affects liver enzymes"    Social History:   Social History   Socioeconomic History   Marital status: Married    Spouse name: Not on file   Number of children: 1   Years of education: Not on file   Highest education level: Not on file  Occupational History   Occupation: [employer]  Tobacco Use   Smoking status: Former    Packs/day: 1.00    Years: 20.00    Additional pack years: 0.00    Total pack years: 20.00    Types: Cigarettes    Start date: 09/20/1995    Quit date: 09/24/1995    Years since quitting: 27.4   Smokeless tobacco: Never  Vaping Use   Vaping Use: Never used  Substance and Sexual Activity   Alcohol use: Yes    Alcohol/week: 6.0  standard drinks of alcohol    Types: 6 Cans of beer per week   Drug use: No   Sexual activity: Yes  Other Topics Concern   Not on file   Social History Narrative   Not on file   Social Determinants of Health   Financial Resource Strain: Not on file  Food Insecurity: No Food Insecurity (02/21/2023)   Hunger Vital Sign    Worried About Running Out of Food in the Last Year: Never true    Ran Out of Food in the Last Year: Never true  Transportation Needs: No Transportation Needs (02/21/2023)   PRAPARE - Administrator, Civil Service (Medical): No    Lack of Transportation (Non-Medical): No  Physical Activity: Not on file  Stress: Not on file  Social Connections: Not on file  Intimate Partner Violence: Not At Risk (02/21/2023)   Humiliation, Afraid, Rape, and Kick questionnaire    Fear of Current or Ex-Partner: No    Emotionally Abused: No    Physically Abused: No    Sexually Abused: No    Family History:    Family History  Problem Relation Age of Onset   Heart attack Sister    Heart failure Mother    Colon cancer Mother    Colon cancer Brother    GI problems Father      ROS:  Please see the history of present illness.  General:no colds or fevers, no weight changes Skin:no rashes or ulcers HEENT:no blurred vision, no congestion CV:see HPI PUL:see HPI GI:no diarrhea constipation or melena, no indigestion GU:no hematuria, no dysuria MS:no joint pain, no claudication Neuro:no syncope, no lightheadedness Endo:+ diabetes, + thyroid disease  All other ROS reviewed and negative.     Physical Exam/Data:   Vitals:   02/21/23 1529 02/21/23 1841 02/21/23 1900 02/22/23 0445  BP: 128/76 131/87 128/84 126/80  Pulse: (!) 105 (!) 102 100 (!) 101  Resp: Temp: 97.6 F (36.4 C) 98.6 F (37 C) 97.8 F (36.6 C) 97.7 F (36.5 C)  TempSrc: Oral Oral Oral Oral  SpO2: 96% 95% 95% 96%  Weight:      Height:        Intake/Output Summary (Last 24 hours) at 02/22/2023 1101 Last data filed at 02/22/2023 1000 Gross per 24 hour  Intake 1099.27 ml  Output 935 ml  Net 164.27 ml      02/21/2023     1:06 PM 12/14/2022   10:07 AM 06/09/2022    9:22 AM  Last 3 Weights  Weight (lbs) 216 lb 14.9 oz 221 lb 6.4 oz 211 lb 12.8 oz  Weight (kg) 98.4 kg 100.426 kg 96.072 kg     Body mass index is 28.62 kg/m.  General:  Well nourished, well developed, in no acute distress though abd pain with movement  HEENT: normal Neck: no JVD Vascular: No carotid bruits; Distal pulses 2+ bilaterally Cardiac:  normal S1, S2; RRR; no murmur gallup rub or click Lungs:  clear to auscultation bilaterally, no wheezing, rhonchi or rales  Abd: soft, + tenderness RUQ, no hepatomegaly  Ext: no edema Musculoskeletal:  No deformities, BUE and BLE strength normal and equal Skin: warm and dry  Neuro:  CNs 2-12 intact, no focal abnormalities noted Psych:  Normal affect    Relevant CV Studies: Cardiac cath 02/06/22:  LEFT HEART CATH AND CORS/GRAFTS ANGIOGRAPHY    Conclusion       Mid LAD lesion is 90% stenosed.  Prox Cx to Mid Cx lesion is 90% stenosed.   1st Mrg lesion is 90% stenosed.   Dist RCA lesion is 99% stenosed.   Ost RCA to Mid RCA lesion is 20% stenosed.   Origin lesion is 100% stenosed.   Origin to Prox Graft lesion is 100% stenosed.   Origin to Prox Graft lesion is 100% stenosed.   The left ventricular systolic function is normal.   LV end diastolic pressure is mildly elevated.   The left ventricular ejection fraction is 55-65% by visual estimate.   Severe 3 vessel obstructive CAD. There is significant progression of disease in the distal RCA and LCx at OM1 and OM2 bifurcation.  LIMA is now occluded. Known occlusion of SVGs Stents in proximal RCA are patent Good LV function Mildly elevated LVEDP   Plan; significant progression of disease since 2017 and now occluded LIMA to the LAD. Recommend CT surgery consult to consider whether he is a candidate for redo bypass. If not would at least do atherectomy and stenting of the LAD. Further PCI of the distal RCA would be very difficult since the  vessel is hard to engage with prior stent at the ostium. The LCx has bifurcation disease.     Diagnostic Dominance: Right  Intervention  Echo 02/06/22: IMPRESSIONS     1. Left ventricular ejection fraction, by estimation, is 55 to 60%. The  left ventricle has normal function. The left ventricle has no regional  wall motion abnormalities. There is mild concentric left ventricular  hypertrophy. Left ventricular diastolic  parameters are indeterminate.   2. Right ventricular systolic function is normal. The right ventricular  size is normal. Tricuspid regurgitation signal is inadequate for assessing  PA pressure.   3. The mitral valve is normal in structure. No evidence of mitral valve  regurgitation. No evidence of mitral stenosis.   4. The aortic valve is grossly normal. There is mild calcification of the  aortic valve. There is mild thickening of the aortic valve. Aortic valve  regurgitation is mild. No aortic stenosis is present.   5. The inferior vena cava is normal in size with greater than 50%  respiratory variability, suggesting right atrial pressure of 3 mmHg.   6. Cannot exclude a small PFO.   Laboratory Data:  High Sensitivity Troponin:  No results for input(s): "TROPONINIHS" in the last 720 hours.   Chemistry Recent Labs  Lab 02/21/23 1557 02/22/23 0814  NA 132* 131*  K 3.8 3.5  CL 97* 95*  CO2 23 22  GLUCOSE 269* 251*  BUN 8 12  CREATININE 0.90 0.85  CALCIUM 9.1 8.8*  GFRNONAA >60 >60  ANIONGAP 12 14    Recent Labs  Lab 02/21/23 1557 02/22/23 0814  PROT 7.2 6.5  ALBUMIN 3.9 3.3*  AST 51* 44*  ALT 41 35  ALKPHOS 50 50  BILITOT 2.6* 2.0*   Lipids No results for input(s): "CHOL", "TRIG", "HDL", "LABVLDL", "LDLCALC", "CHOLHDL" in the last 168 hours.  Hematology Recent Labs  Lab 02/21/23 1557 02/22/23 0814  WBC 8.2 6.6  RBC 4.96 4.65  HGB 15.1 14.1  HCT 44.3 41.9  MCV 89.3 90.1  MCH 30.4 30.3  MCHC 34.1 33.7  RDW 12.9 13.2  PLT 117* 73*    Thyroid No results for input(s): "TSH", "FREET4" in the last 168 hours.  BNPNo results for input(s): "BNP", "PROBNP" in the last 168 hours.  DDimer No results for input(s): "DDIMER" in the last 168 hours.   Radiology/Studies:  No  results found.   Assessment and Plan:   Pre-op eval for anesthesia and lap chole with hx of CABG 2015 and redo CABG 2023 X 3 on ASA and plavix for graft patency.  Pt is 6.6% risk of major cardiac event  a class 3 risk. He is stable. Meets 4 METS without pain or SOB Acute cholecystitis for lap chole once plavix is washed out  CAD with hx of CABG in 2015 and redo in 2023.  Had multiple PCIs before and after 2015 CABG.   Back to plavix and asa post procedure HLD on zetia and crestor. And lovaza continue  HTN on altace 20  not on BB due to hx of bradycardia  Thrombocytopenia no bleeding  Risk Assessment/Risk Scores:                For questions or updates, please contact [facility] Please consult [email or web address] for contact info under    Signed, Nada Boozer, NP  02/22/2023 11:01 AM

## 2023-02-22 NOTE — Assessment & Plan Note (Signed)
-   CABG in 2015 followed by redo in April 2023 -Cardiology consult requested for clearance given cardiac history - plavix on hold - continue asa, zetia, lopressor, ramipril

## 2023-02-22 NOTE — Inpatient Diabetes Management (Signed)
Inpatient Diabetes Program Recommendations  AACE/ADA: New Consensus Statement on Inpatient Glycemic Control (2015)  Target Ranges:  Prepandial:   less than 140 mg/dL      Peak postprandial:   less than 180 mg/dL (1-2 hours)      Critically ill patients:  140 - 180 mg/dL   Lab Results  Component Value Date   GLUCAP 246 (H) 02/22/2023   HGBA1C 8.6 (H) 02/21/2023    Review of Glycemic Control  Latest Reference Range & Units 02/21/23 12:38 02/21/23 16:22 02/21/23 20:42 02/22/23 07:56  Glucose-Capillary 70 - 99 mg/dL 440 (H) 102 (H) 725 (H) 246 (H)  (H): Data is abnormally high  Diabetes history: DM2 Outpatient Diabetes medications: Glipizide 5 mg QAM, Metformin 1000 mg BID Current orders for Inpatient glycemic control: Novolog 0-15 units TID and 0-5 units QHS  Inpatient Diabetes Program Recommendations:    Semglee 10 units QD  Will continue to follow while inpatient.  Thank you, Dulce Sellar, MSN, CDCES Diabetes Coordinator Inpatient Diabetes Program [phone] (team pager from 8a-5p)

## 2023-02-22 NOTE — Assessment & Plan Note (Addendum)
-   Continue ramipril and metoprolol (currently tachycardic; will see how he tolerates BB given cardiology note of hx bradycardia on BB)

## 2023-02-22 NOTE — Assessment & Plan Note (Addendum)
-   statin intolerant per allergy list - on zetia at home

## 2023-02-23 ENCOUNTER — Encounter (HOSPITAL_COMMUNITY): Payer: Self-pay | Admitting: Internal Medicine

## 2023-02-23 DIAGNOSIS — Z951 Presence of aortocoronary bypass graft: Secondary | ICD-10-CM | POA: Diagnosis not present

## 2023-02-23 DIAGNOSIS — R7401 Elevation of levels of liver transaminase levels: Secondary | ICD-10-CM | POA: Diagnosis not present

## 2023-02-23 DIAGNOSIS — K810 Acute cholecystitis: Secondary | ICD-10-CM | POA: Diagnosis not present

## 2023-02-23 LAB — CBC WITH DIFFERENTIAL/PLATELET
Abs Immature Granulocytes: 0.11 10*3/uL — ABNORMAL HIGH (ref 0.00–0.07)
Basophils Absolute: 0 10*3/uL (ref 0.0–0.1)
Basophils Relative: 0 %
Eosinophils Absolute: 0 10*3/uL (ref 0.0–0.5)
Eosinophils Relative: 0 %
HCT: 38 % — ABNORMAL LOW (ref 39.0–52.0)
Hemoglobin: 12.6 g/dL — ABNORMAL LOW (ref 13.0–17.0)
Immature Granulocytes: 1 %
Lymphocytes Relative: 12 %
Lymphs Abs: 1 10*3/uL (ref 0.7–4.0)
MCH: 30.2 pg (ref 26.0–34.0)
MCHC: 33.2 g/dL (ref 30.0–36.0)
MCV: 91.1 fL (ref 80.0–100.0)
Monocytes Absolute: 0.3 10*3/uL (ref 0.1–1.0)
Monocytes Relative: 4 %
Neutro Abs: 6.4 10*3/uL (ref 1.7–7.7)
Neutrophils Relative %: 83 %
Platelets: 65 10*3/uL — ABNORMAL LOW (ref 150–400)
RBC: 4.17 MIL/uL — ABNORMAL LOW (ref 4.22–5.81)
RDW: 13.1 % (ref 11.5–15.5)
WBC: 7.8 10*3/uL (ref 4.0–10.5)
nRBC: 0 % (ref 0.0–0.2)

## 2023-02-23 LAB — COMPREHENSIVE METABOLIC PANEL
ALT: 41 U/L (ref 0–44)
AST: 54 U/L — ABNORMAL HIGH (ref 15–41)
Albumin: 3 g/dL — ABNORMAL LOW (ref 3.5–5.0)
Alkaline Phosphatase: 55 U/L (ref 38–126)
Anion gap: 9 (ref 5–15)
BUN: 14 mg/dL (ref 8–23)
CO2: 24 mmol/L (ref 22–32)
Calcium: 8 mg/dL — ABNORMAL LOW (ref 8.9–10.3)
Chloride: 96 mmol/L — ABNORMAL LOW (ref 98–111)
Creatinine, Ser: 1.06 mg/dL (ref 0.61–1.24)
GFR, Estimated: >60 mL/min (ref >60)
Glucose, Bld: 190 mg/dL — ABNORMAL HIGH (ref 70–99)
Potassium: 3 mmol/L — ABNORMAL LOW (ref 3.5–5.1)
Sodium: 129 mmol/L — ABNORMAL LOW (ref 135–145)
Total Bilirubin: 1.9 mg/dL — ABNORMAL HIGH (ref 0.3–1.2)
Total Protein: 6.1 g/dL — ABNORMAL LOW (ref 6.5–8.1)

## 2023-02-23 LAB — GLUCOSE, CAPILLARY
Glucose-Capillary: 182 mg/dL — ABNORMAL HIGH (ref 70–99)
Glucose-Capillary: 233 mg/dL — ABNORMAL HIGH (ref 70–99)
Glucose-Capillary: 176 mg/dL — ABNORMAL HIGH (ref 70–99)
Glucose-Capillary: 275 mg/dL — ABNORMAL HIGH (ref 70–99)

## 2023-02-23 LAB — MAGNESIUM: Magnesium: 1.8 mg/dL (ref 1.7–2.4)

## 2023-02-23 MED ORDER — POTASSIUM CHLORIDE 10 MEQ/100ML IV SOLN
10.0000 meq | INTRAVENOUS | Status: DC
  Filled 2023-02-23: qty 100

## 2023-02-23 MED ORDER — ORAL CARE MOUTH RINSE: 15.0000 mL | OROMUCOSAL | Status: DC | PRN

## 2023-02-23 MED ORDER — DOCUSATE SODIUM 100 MG PO CAPS
100.0000 mg | ORAL_CAPSULE | Freq: Two times a day (BID) | ORAL | Status: DC
  Administered 2023-02-23 – 2023-02-27 (×8): 100 mg via ORAL
  Filled 2023-02-23 (×8): qty 1

## 2023-02-23 MED ORDER — POTASSIUM CHLORIDE 10 MEQ/100ML IV SOLN
10.0000 meq | INTRAVENOUS | Status: AC
  Administered 2023-02-23 (×4): 10 meq via INTRAVENOUS
  Filled 2023-02-23 (×3): qty 100

## 2023-02-23 MED ORDER — OXYCODONE HCL 5 MG PO TABS
5.0000 mg | ORAL_TABLET | ORAL | Status: DC | PRN
  Administered 2023-02-23 – 2023-02-24 (×3): 5 mg via ORAL
  Administered 2023-02-24: 10 mg via ORAL
  Administered 2023-02-24 – 2023-02-26 (×7): 5 mg via ORAL
  Administered 2023-02-26: 10 mg via ORAL
  Administered 2023-02-27: 5 mg via ORAL
  Filled 2023-02-23: qty 2
  Filled 2023-02-23 (×9): qty 1
  Filled 2023-02-23: qty 2
  Filled 2023-02-23 (×2): qty 1

## 2023-02-23 MED ORDER — MAGNESIUM SULFATE 2 GM/50ML IV SOLN
2.0000 g | Freq: Once | INTRAVENOUS | Status: AC
  Administered 2023-02-23: 2 g via INTRAVENOUS
  Filled 2023-02-23: qty 50

## 2023-02-23 MED ORDER — POLYETHYLENE GLYCOL 3350 17 G PO PACK
17.0000 g | PACK | Freq: Every day | ORAL | Status: DC
  Administered 2023-02-23 – 2023-02-27 (×4): 17 g via ORAL
  Filled 2023-02-23 (×4): qty 1

## 2023-02-23 MED ORDER — BOOST / RESOURCE BREEZE PO LIQD CUSTOM
1.0000 | Freq: Three times a day (TID) | ORAL | Status: DC
  Administered 2023-02-23 – 2023-02-26 (×7): 1 via ORAL

## 2023-02-23 NOTE — Progress Notes (Signed)
Mobility Specialist - Progress Note   02/23/23 1501  Mobility  Activity Ambulated with assistance in hallway  Level of Assistance Modified independent, requires aide device or extra time  Assistive Device Other (Comment) (IV Pole)  Distance Ambulated (ft) 290 ft  Activity Response Tolerated well  Mobility Referral Yes  $Mobility charge 1 Mobility   Pt received on bench and agreeable to mobility. No complaints during session. Pt to bed after session with all needs met.    [facility]

## 2023-02-23 NOTE — Inpatient Diabetes Management (Signed)
Inpatient Diabetes Program Recommendations  AACE/ADA: New Consensus Statement on Inpatient Glycemic Control (2015)  Target Ranges:  Prepandial:   less than 140 mg/dL      Peak postprandial:   less than 180 mg/dL (1-2 hours)      Critically ill patients:  140 - 180 mg/dL   Lab Results  Component Value Date   GLUCAP 233 (H) 02/23/2023   HGBA1C 8.6 (H) 02/21/2023    Review of Glycemic Control  Latest Reference Range & Units 02/22/23 17:00 02/22/23 21:50 02/23/23 07:34 02/23/23 11:52  Glucose-Capillary 70 - 99 mg/dL 409 (H) 811 (H) 914 (H) 233 (H)  (H): Data is abnormally high Diabetes history: DM2 Outpatient Diabetes medications: Glipizide 5 mg QAM, Metformin 1000 mg BID Current orders for Inpatient glycemic control: Novolog 0-15 units TID and 0-5 units QHS   Inpatient Diabetes Program Recommendations:     Consider adding Semglee 10 units QD   Thanks, Lujean Rave, MSN, RNC-OB Diabetes Coordinator [phone] (8a-5p)

## 2023-02-23 NOTE — Progress Notes (Signed)
Progress Note    Miguel Vasquez   MVH:[id number]  DOB: 11-Mar-1946  DOA: 02/21/2023     2 PCP: Benjaman Pott, MD  Initial CC: abd pain  Hospital Course: Mr. Lemmons is a 77 yo male with PMH CAD s/p CABG x 2, HTN, HLD, hypothyroidism, DM II who presented as a transfer from [facility] for acute cholecystitis.  He initially presented there with abdominal pain and nausea but no vomiting. CT abdomen/pelvis was performed on 02/20/2023 which showed cholelithiasis with mild inflammatory changes surrounding the gallbladder and a gallstone visualized in the proximal cystic duct.  He was transferred for further surgery evaluation and possible cholecystectomy.  Cardiology was also consulted on admission for cardiac clearance.  Interval History:  No events overnight.  Still having abdominal pain as expected.  Tentative plan is for surgery on Thursday.  Wife present bedside this morning.  Assessment and Plan: * Acute cholecystitis - noted on CT A/P at [facility] - general surgery following for CCY timing; plavix on hold -Cardiology consulted for clearance as well, appreciate assistance - Continue pain and nausea control -Tentative plan is for surgery on Thursday after full Plavix washout  Transaminitis - Mildly elevated AST and total bili - Follow for improvement pending cholecystectomy  H/O three vessel coronary artery bypass - CABG in 2015 followed by redo in April 2023 -Cardiology consult requested for clearance given cardiac history - plavix on hold - continue asa, zetia, lopressor, ramipril  Hypertension - Continue ramipril and metoprolol (currently tachycardic; will see how he tolerates BB given cardiology note of hx bradycardia on BB)  DMII (diabetes mellitus, type 2) - Continue SSI and CBG monitoring  Hypercholesterolemia - statin intolerant per allergy list - on zetia at home   Old records reviewed in assessment of this patient  Antimicrobials: Zosyn 02/21/2023 >>  current  DVT prophylaxis:  enoxaparin (LOVENOX) injection 40 mg Start: 02/21/23 2200 SCDs Start: 02/21/23 1220   Code Status:   Code Status: Full Code  Mobility Assessment (last 72 hours)     Mobility Assessment     Row Name 02/23/23 0830 02/22/23 1930 02/22/23 0830 02/21/23 2130 02/21/23 1252   Does patient have an order for bedrest or is patient medically unstable No - Continue assessment No - Continue assessment No - Continue assessment No - Continue assessment No - Continue assessment   What is the highest level of mobility based on the progressive mobility assessment? Level 5 (Walks with assist in room/hall) - Balance while stepping forward/back and can walk in room with assist - Complete Level 5 (Walks with assist in room/hall) - Balance while stepping forward/back and can walk in room with assist - Complete Level 5 (Walks with assist in room/hall) - Balance while stepping forward/back and can walk in room with assist - Complete Level 5 (Walks with assist in room/hall) - Balance while stepping forward/back and can walk in room with assist - Complete Level 5 (Walks with assist in room/hall) - Balance while stepping forward/back and can walk in room with assist - Complete            Barriers to discharge: none Disposition Plan:  Home 3-4 days Status is: Inpt  Objective: Blood pressure 93/62, pulse 73, temperature 99 F (37.2 C), temperature source Oral, resp. rate 20, height  (1.854 m), weight 98.4 kg, SpO2 97 %.  Examination:  Physical Exam Constitutional:      General: He is not in acute distress.    Appearance: Normal  appearance.  HENT:     Head: Normocephalic and atraumatic.     Mouth/Throat:     Mouth: Mucous membranes are moist.  Eyes:     Extraocular Movements: Extraocular movements intact.  Cardiovascular:     Rate and Rhythm: Normal rate and regular rhythm.     Heart sounds: Normal heart sounds.  Pulmonary:     Effort: Pulmonary effort is normal. No  respiratory distress.     Breath sounds: Normal breath sounds. No wheezing.  Abdominal:     General: Bowel sounds are normal. There is no distension.     Palpations: Abdomen is soft.     Tenderness: There is abdominal tenderness (generalized but worse in right sided quadrants).  Musculoskeletal:        General: Normal range of motion.     Cervical back: Normal range of motion and neck supple.  Skin:    General: Skin is warm and dry.  Neurological:     General: No focal deficit present.     Mental Status: He is alert.  Psychiatric:        Mood and Affect: Mood normal.        Behavior: Behavior normal.      Consultants:  General surgery Cardiology  Procedures:    Data Reviewed: Results for orders placed or performed during the hospital encounter of 02/21/23 (from the past 24 hour(s))  Glucose, capillary     Status: Abnormal   Collection Time: 02/22/23  5:00 PM  Result Value Ref Range   Glucose-Capillary 236 (H) 70 - 99 mg/dL   Comment 1 Notify RN   Glucose, capillary     Status: Abnormal   Collection Time: 02/22/23  9:50 PM  Result Value Ref Range   Glucose-Capillary 187 (H) 70 - 99 mg/dL   Comment 1 Notify RN    Comment 2 Document in Chart   CBC with Differential/Platelet     Status: Abnormal   Collection Time: 02/23/23  6:13 AM  Result Value Ref Range   WBC 7.8 4.0 - 10.5 K/uL   RBC 4.17 (L) 4.22 - 5.81 MIL/uL   Hemoglobin 12.6 (L) 13.0 - 17.0 g/dL   HCT 16.1 (L) 09.6 - 04.5 %   MCV 91.1 80.0 - 100.0 fL   MCH 30.2 26.0 - 34.0 pg   MCHC 33.2 30.0 - 36.0 g/dL   RDW 40.9 81.1 - 91.4 %   Platelets 65 (L) 150 - 400 K/uL   nRBC 0.0 0.0 - 0.2 %   Neutrophils Relative % 83 %   Neutro Abs 6.4 1.7 - 7.7 K/uL   Lymphocytes Relative 12 %   Lymphs Abs 1.0 0.7 - 4.0 K/uL   Monocytes Relative 4 %   Monocytes Absolute 0.3 0.1 - 1.0 K/uL   Eosinophils Relative 0 %   Eosinophils Absolute 0.0 0.0 - 0.5 K/uL   Basophils Relative 0 %   Basophils Absolute 0.0 0.0 - 0.1 K/uL    WBC Morphology DOHLE BODIES    Immature Granulocytes 1 %   Abs Immature Granulocytes 0.11 (H) 0.00 - 0.07 K/uL  Comprehensive metabolic panel     Status: Abnormal   Collection Time: 02/23/23  6:13 AM  Result Value Ref Range   Sodium 129 (L) 135 - 145 mmol/L   Potassium 3.0 (L) 3.5 - 5.1 mmol/L   Chloride 96 (L) 98 - 111 mmol/L   CO2 24 22 - 32 mmol/L   Glucose, Bld 190 (H) 70 - 99 mg/dL  BUN 14 8 - 23 mg/dL   Creatinine, Ser 1.91 0.61 - 1.24 mg/dL   Calcium 8.0 (L) 8.9 - 10.3 mg/dL   Total Protein 6.1 (L) 6.5 - 8.1 g/dL   Albumin 3.0 (L) 3.5 - 5.0 g/dL   AST 54 (H) 15 - 41 U/L   ALT 41 0 - 44 U/L   Alkaline Phosphatase 55 38 - 126 U/L   Total Bilirubin 1.9 (H) 0.3 - 1.2 mg/dL   GFR, Estimated >47 >82 mL/min   Anion gap 9 5 - 15  Magnesium     Status: None   Collection Time: 02/23/23  6:13 AM  Result Value Ref Range   Magnesium 1.8 1.7 - 2.4 mg/dL  Glucose, capillary     Status: Abnormal   Collection Time: 02/23/23  7:34 AM  Result Value Ref Range   Glucose-Capillary 182 (H) 70 - 99 mg/dL  Glucose, capillary     Status: Abnormal   Collection Time: 02/23/23 11:52 AM  Result Value Ref Range   Glucose-Capillary 233 (H) 70 - 99 mg/dL    I have reviewed pertinent nursing notes, vitals, labs, and images as necessary. I have ordered labwork to follow up on as indicated.  I have reviewed the last notes from staff over past 24 hours. I have discussed patient's care plan and test results with nursing staff, CM/SW, and other staff as appropriate.  Time spent: Greater than 50% of the 55 minute visit was spent in counseling/coordination of care for the patient as laid out in the A&P.   LOS: 2 days   Lewie Chamber, MD [facility] 02/23/2023, 2:37 PM

## 2023-02-23 NOTE — Progress Notes (Signed)
Subjective: CC: Continued pain in epigastrium/ruq. No n/v. Tolerating cld. Pain is not worsened with po intake. Passing flatus. No bm.   Hx thrombocytopenia on prior labs. He has never seen a hematologist.   Objective: Vital signs in last 24 hours: Temp:  [97.4 F (36.3 C)-98.6 F (37 C)] 98.6 F (37 C) (04/16 0653) Pulse Rate:  [82-102] 82 (04/16 0653) Resp:  [16-18] 18 (04/16 0653) BP: (111-132)/(72-82) 119/75 (04/16 0653) SpO2:  [94 %-97 %] 97 % (04/16 0653) Last BM Date : 02/20/23  Intake/Output from previous day: 04/15 0701 - 04/16 0700 In: 230.9 [P.O.:150; IV Piggyback:80.9] Out: 125 [Urine:125] Intake/Output this shift: No intake/output data recorded.  PE: Gen:  Alert, NAD, pleasant Pulm:  Rate and effort normal Abd: Soft, ND, epigastric and RUQ ttp. +BS Psych: A&Ox3  Lab Results:  Recent Labs    02/22/23 0814 02/23/23 0613  WBC 6.6 7.8  HGB 14.1 12.6*  HCT 41.9 38.0*  PLT 73* 65*    BMET Recent Labs    02/22/23 0814 02/23/23 0613  NA 131* 129*  K 3.5 3.0*  CL 95* 96*  CO2 22 24  GLUCOSE 251* 190*  BUN 12 14  CREATININE 0.85 1.06  CALCIUM 8.8* 8.0*    PT/INR Recent Labs    02/21/23 1557  LABPROT 15.1  INR 1.2    CMP     Component Value Date/Time   NA 129 (L) 02/23/2023 0613   NA 138 02/04/2022 1104   K 3.0 (L) 02/23/2023 0613   CL 96 (L) 02/23/2023 0613   CO2 24 02/23/2023 0613   GLUCOSE 190 (H) 02/23/2023 0613   BUN 14 02/23/2023 0613   BUN 14 02/04/2022 1104   CREATININE 1.06 02/23/2023 0613   CREATININE 0.78 05/07/2016 0736   CALCIUM 8.0 (L) 02/23/2023 0613   PROT 6.1 (L) 02/23/2023 0613   PROT 6.6 02/04/2022 1104   ALBUMIN 3.0 (L) 02/23/2023 0613   ALBUMIN 4.6 02/04/2022 1104   AST 54 (H) 02/23/2023 0613   ALT 41 02/23/2023 0613   ALKPHOS 55 02/23/2023 0613   BILITOT 1.9 (H) 02/23/2023 0613   BILITOT 0.9 02/04/2022 1104   GFRNONAA >60 02/23/2023 0613   GFRAA >60 05/20/2016 0550   Lipase     Component  Value Date/Time   LIPASE 20 02/22/2023 0814    Studies/Results: No results found.       Anti-infectives: Anti-infectives (From admission, onward)    Start     Dose/Rate Route Frequency Ordered Stop   02/21/23 1300  piperacillin-tazobactam (ZOSYN) IVPB 3.375 g        3.375 g 12.5 mL/hr over 240 Minutes Intravenous Every 8 hours 02/21/23 1141          Assessment/Plan Acute Cholecystitis - CT from outside hospital reviewed and c/w acute cholecystitis - Cont abx - LFT's stable/downtrending. Continue to trend - Lipase elevated at 122 at [facility] > normalized  - Hold plavix, last dose 4/13am - Cardiology consult for preoperative evaluation/clearance appreciate - moderate risk for perioperative complications. No further w/u recommended.  - With thrombocytopenia (plt 73 > 65) would wait full 5d after last dose of plavix before proceeding with lap chole. Plan for lap chole on 4/18. Will write for type and screen in the am.   FEN - Don't adv past FLD. IVF per TRH VTE - SCDs, Lovenox ID - Zosyn  - Per [facility] -  CAD s/p CABG x 2 on DAPT  HTN HLD  GERD/remote hx Gastric Ulcer - PPI Hypothyroidism Thrombocytopenia - Hx of similar on prior labs. He has never seen a hematologist.   I reviewed nursing notes, hospitalist notes, cardiology notes, last 24 h vitals and pain scores, last 48 h intake and output, last 24 h labs and trends, and last 24 h imaging results.   LOS: 2 days    Miguel Vasquez , [facility] 02/23/2023, 8:01 AM Please see Amion for pager number during day hours 7:00am-4:30pm

## 2023-02-24 DIAGNOSIS — E871 Hypo-osmolality and hyponatremia: Secondary | ICD-10-CM | POA: Insufficient documentation

## 2023-02-24 DIAGNOSIS — K810 Acute cholecystitis: Secondary | ICD-10-CM | POA: Diagnosis not present

## 2023-02-24 LAB — CBC WITH DIFFERENTIAL/PLATELET
Abs Immature Granulocytes: 0.02 10*3/uL (ref 0.00–0.07)
Basophils Absolute: 0 10*3/uL (ref 0.0–0.1)
Basophils Relative: 0 %
Eosinophils Absolute: 0.1 10*3/uL (ref 0.0–0.5)
Eosinophils Relative: 1 %
HCT: 35.7 % — ABNORMAL LOW (ref 39.0–52.0)
Hemoglobin: 11.9 g/dL — ABNORMAL LOW (ref 13.0–17.0)
Immature Granulocytes: 0 %
Lymphocytes Relative: 13 %
Lymphs Abs: 0.9 10*3/uL (ref 0.7–4.0)
MCH: 30.2 pg (ref 26.0–34.0)
MCHC: 33.3 g/dL (ref 30.0–36.0)
MCV: 90.6 fL (ref 80.0–100.0)
Monocytes Absolute: 0.6 10*3/uL (ref 0.1–1.0)
Monocytes Relative: 9 %
Neutro Abs: 5.2 10*3/uL (ref 1.7–7.7)
Neutrophils Relative %: 77 %
Platelets: 70 10*3/uL — ABNORMAL LOW (ref 150–400)
RBC: 3.94 MIL/uL — ABNORMAL LOW (ref 4.22–5.81)
RDW: 12.9 % (ref 11.5–15.5)
WBC: 6.9 10*3/uL (ref 4.0–10.5)
nRBC: 0 % (ref 0.0–0.2)

## 2023-02-24 LAB — COMPREHENSIVE METABOLIC PANEL
ALT: 49 U/L — ABNORMAL HIGH (ref 0–44)
AST: 74 U/L — ABNORMAL HIGH (ref 15–41)
Albumin: 2.7 g/dL — ABNORMAL LOW (ref 3.5–5.0)
Alkaline Phosphatase: 73 U/L (ref 38–126)
Anion gap: 9 (ref 5–15)
BUN: 20 mg/dL (ref 8–23)
CO2: 22 mmol/L (ref 22–32)
Calcium: 7.6 mg/dL — ABNORMAL LOW (ref 8.9–10.3)
Chloride: 94 mmol/L — ABNORMAL LOW (ref 98–111)
Creatinine, Ser: 1.09 mg/dL (ref 0.61–1.24)
GFR, Estimated: >60 mL/min (ref >60)
Glucose, Bld: 193 mg/dL — ABNORMAL HIGH (ref 70–99)
Potassium: 2.8 mmol/L — ABNORMAL LOW (ref 3.5–5.1)
Sodium: 125 mmol/L — ABNORMAL LOW (ref 135–145)
Total Bilirubin: 1.5 mg/dL — ABNORMAL HIGH (ref 0.3–1.2)
Total Protein: 6.1 g/dL — ABNORMAL LOW (ref 6.5–8.1)

## 2023-02-24 LAB — TSH: TSH: 3.578 u[IU]/mL (ref 0.350–4.500)

## 2023-02-24 LAB — MAGNESIUM: Magnesium: 2.3 mg/dL (ref 1.7–2.4)

## 2023-02-24 LAB — SURGICAL PCR SCREEN
MRSA, PCR: NEGATIVE
Staphylococcus aureus: NEGATIVE

## 2023-02-24 LAB — TYPE AND SCREEN
ABO/RH(D): O NEG
Antibody Screen: NEGATIVE

## 2023-02-24 LAB — GLUCOSE, CAPILLARY
Glucose-Capillary: 178 mg/dL — ABNORMAL HIGH (ref 70–99)
Glucose-Capillary: 168 mg/dL — ABNORMAL HIGH (ref 70–99)
Glucose-Capillary: 178 mg/dL — ABNORMAL HIGH (ref 70–99)
Glucose-Capillary: 152 mg/dL — ABNORMAL HIGH (ref 70–99)

## 2023-02-24 LAB — SODIUM, URINE, RANDOM: Sodium, Ur: 24 mmol/L

## 2023-02-24 LAB — CORTISOL: Cortisol, Plasma: 24.2 ug/dL

## 2023-02-24 LAB — OSMOLALITY: Osmolality: 277 mOsm/kg (ref 275–295)

## 2023-02-24 LAB — OSMOLALITY, URINE: Osmolality, Ur: 457 mOsm/kg (ref 300–900)

## 2023-02-24 MED ORDER — POTASSIUM CHLORIDE CRYS ER 20 MEQ PO TBCR
40.0000 meq | EXTENDED_RELEASE_TABLET | ORAL | Status: AC
  Administered 2023-02-24 (×2): 40 meq via ORAL
  Filled 2023-02-24 (×2): qty 2

## 2023-02-24 MED ORDER — INSULIN GLARGINE-YFGN 100 UNIT/ML ~~LOC~~ SOLN
10.0000 [IU] | Freq: Every day | SUBCUTANEOUS | Status: DC
  Administered 2023-02-24 – 2023-02-26 (×2): 10 [IU] via SUBCUTANEOUS
  Filled 2023-02-24 (×4): qty 0.1

## 2023-02-24 MED ORDER — LACTATED RINGERS IV SOLN: INTRAVENOUS | Status: DC

## 2023-02-24 NOTE — Progress Notes (Signed)
Subjective: CC: Improved epigastrium/ruq. No n/v. Tolerating fld. Passing flatus. No bm.    Objective: Vital signs in last 24 hours: Temp:  [97.8 F (36.6 C)-99 F (37.2 C)] 98.4 F (36.9 C) (04/17 0521) Pulse Rate:  [73-80] 74 (04/17 0521) Resp:  [14-20] 14 (04/17 0521) BP: (93-121)/(50-88) 121/69 (04/17 0521) SpO2:  [95 %-97 %] 97 % (04/17 0521) Last BM Date : 02/20/23  Intake/Output from previous day: 04/16 0701 - 04/17 0700 In: 120 [P.O.:120] Out: 1075 [Urine:1075] Intake/Output this shift: No intake/output data recorded.  PE: Gen:  Alert, NAD, pleasant Pulm:  Rate and effort normal Abd: Soft, ND, epigastric and RUQ ttp. +BS Psych: A&Ox3  Lab Results:  Recent Labs    02/23/23 0613 02/24/23 0550  WBC 7.8 6.9  HGB 12.6* 11.9*  HCT 38.0* 35.7*  PLT 65* 70*    BMET Recent Labs    02/23/23 0613 02/24/23 0550  NA 129* 125*  K 3.0* 2.8*  CL 96* 94*  CO2 24 22  GLUCOSE 190* 193*  BUN 14 20  CREATININE 1.06 1.09  CALCIUM 8.0* 7.6*    PT/INR Recent Labs    02/21/23 1557  LABPROT 15.1  INR 1.2    CMP     Component Value Date/Time   NA 125 (L) 02/24/2023 0550   NA 138 02/04/2022 1104   K 2.8 (L) 02/24/2023 0550   CL 94 (L) 02/24/2023 0550   CO2 22 02/24/2023 0550   GLUCOSE 193 (H) 02/24/2023 0550   BUN 20 02/24/2023 0550   BUN 14 02/04/2022 1104   CREATININE 1.09 02/24/2023 0550   CREATININE 0.78 05/07/2016 0736   CALCIUM 7.6 (L) 02/24/2023 0550   PROT 6.1 (L) 02/24/2023 0550   PROT 6.6 02/04/2022 1104   ALBUMIN 2.7 (L) 02/24/2023 0550   ALBUMIN 4.6 02/04/2022 1104   AST 74 (H) 02/24/2023 0550   ALT 49 (H) 02/24/2023 0550   ALKPHOS 73 02/24/2023 0550   BILITOT 1.5 (H) 02/24/2023 0550   BILITOT 0.9 02/04/2022 1104   GFRNONAA >60 02/24/2023 0550   GFRAA >60 05/20/2016 0550   Lipase     Component Value Date/Time   LIPASE 20 02/22/2023 0814    Studies/Results: No results found.       Anti-infectives: Anti-infectives  (From admission, onward)    Start     Dose/Rate Route Frequency Ordered Stop   02/21/23 1300  piperacillin-tazobactam (ZOSYN) IVPB 3.375 g        3.375 g 12.5 mL/hr over 240 Minutes Intravenous Every 8 hours 02/21/23 1141          Assessment/Plan Acute Cholecystitis - CT from outside hospital reviewed and c/w acute cholecystitis - Cont abx - LFT's stable/downtrending. Continue to trend - Lipase elevated at 122 at [facility] > normalized  - Hold plavix, last dose 4/13am - Cardiology consult for preoperative evaluation/clearance appreciate - moderate risk for perioperative complications. No further w/u recommended.  - With thrombocytopenia (plt 73 > 65 > 70) would wait full 5d after last dose of plavix before proceeding with lap chole. Plan for lap chole on 4/18. Type and screen done. Depending on am labs, may need platelets/blood products available on call to OR. Patient consented to transfusion of blood products if needed.  - Plan OR in the AM. I have explained the procedure, risks, and aftercare of Laparoscopic cholecystectomy.  Risks include but are not limited to anesthesia (MI, CVA, death, prolonged intubation and aspiration), bleeding, infection, wound  problems, hernia, bile leak, injury to common bile duct/liver/intestine, possible need for subtotal cholecystectomy or open cholecystectomy, increased risk of DVT/PE and diarrhea post op. He seems to understand and agrees to proceed.  FEN - FLD, IVF per [facility]. Hypokalemia - replace K (2.8) VTE - SCDs, Lovenox ID - Zosyn  - Per [facility] -  CAD s/p CABG x 2 on DAPT  HTN HLD GERD/remote hx Gastric Ulcer - PPI Hypothyroidism Thrombocytopenia - Hx of similar on prior labs. He has never seen a hematologist.   I reviewed nursing notes, hospitalist notes, cardiology notes, last 24 h vitals and pain scores, last 48 h intake and output, last 24 h labs and trends, and last 24 h imaging results.   LOS: 3 days    Miguel Vasquez ,  [facility] 02/24/2023, 8:50 AM Please see Amion for pager number during day hours 7:00am-4:30pm

## 2023-02-24 NOTE — Progress Notes (Signed)
PROGRESS NOTE    Miguel Vasquez  ZOX:[id number] DOB: 1945-12-27 DOA: 02/21/2023 PCP: Benjaman Pott, MD     Brief Narrative:  Miguel Vasquez is a 77 yo male with PMH CAD s/p CABG x 2, HTN, HLD, hypothyroidism, DM II who presented as a transfer from [facility] for acute cholecystitis.  He initially presented there with abdominal pain and nausea but no vomiting. CT abdomen/pelvis was performed on 02/20/2023 which showed cholelithiasis with mild inflammatory changes surrounding the gallbladder and a gallstone visualized in the proximal cystic duct.  He was transferred for further surgery evaluation and possible cholecystectomy.  Cardiology was also consulted on admission for cardiac clearance.  New events last 24 hours / Subjective: Patient feeling well overall.  Still having some abdominal pain.  No nausea or vomiting.  Awaiting OR tomorrow.  Assessment & Plan:   Principal Problem:   Acute cholecystitis Active Problems:   Transaminitis   H/O three vessel coronary artery bypass   Hypertension   Hypercholesterolemia   DMII (diabetes mellitus, type 2)   S/P CABG x 3   CAD (coronary artery disease) of artery bypass graft, VG-OM occluded,    Hyponatremia   Acute cholecystitis -Plavix on hold -Cardiology consulted for preop evaluation 4/15 -General surgery following, planning for OR 4/18 -Zosyn  Acute transaminitis -Trend  CAD status post CABG -Cardiology consulted for preop evaluation 4/15 -Continue aspirin, Zetia, Lopressor.  Plavix on hold, resume as soon as able postoperatively  Hypertension -Metoprolol  Diabetes mellitus -Added Semglee.  Sliding scale insulin  Hyperlipidemia -Zetia  Hyponatremia -Serum osmole 277 -Cortisol 24 -TSH 3.578 -Urine sodium 24 -Urine osmole pending -Patient appears euvolemic.  He is also on ACE inhibitor which can contribute.  I will discontinue this today and monitor sodium  levels.  Hypokalemia -Replace  Hypothyroidism -Synthroid   DVT prophylaxis:  enoxaparin (LOVENOX) injection 40 mg Start: 02/21/23 2200 SCDs Start: 02/21/23 1220  Code Status: Full code Family Communication: No family at bedside Disposition Plan: Home Status is: Inpatient Remains inpatient appropriate because: Surgery tomorrow    Antimicrobials:  Anti-infectives (From admission, onward)    Start     Dose/Rate Route Frequency Ordered Stop   02/21/23 1300  piperacillin-tazobactam (ZOSYN) IVPB 3.375 g        3.375 g 12.5 mL/hr over 240 Minutes Intravenous Every 8 hours 02/21/23 1141          Objective: Vitals:   02/23/23 2005 02/23/23 2041 02/24/23 0521 02/24/23 1201  BP: (!) 94/50 110/88 121/69 103/64  Pulse: 80  74 72  Resp: Temp: 97.8 F (36.6 C)  98.4 F (36.9 C) 97.9 F (36.6 C)  TempSrc: Oral  Oral Oral  SpO2: 95%  97% 97%  Weight:      Height:        Intake/Output Summary (Last 24 hours) at 02/24/2023 1329 Last data filed at 02/24/2023 0525 Gross per 24 hour  Intake --  Output 275 ml  Net -275 ml   Filed Weights   02/21/23 1306  Weight: 98.4 kg    Examination:  General exam: Appears calm and comfortable  Respiratory system: Clear to auscultation. Respiratory effort normal. No respiratory distress. No conversational dyspnea.  Cardiovascular system: S1 & S2 heard, RRR. No murmurs. No pedal edema. Gastrointestinal system: Abdomen is nondistended, soft and mildly tender to palpation right upper quadrant Central nervous system: Alert and oriented. No focal neurological deficits. Speech clear.  Extremities: Symmetric in appearance  Skin: No rashes,  lesions or ulcers on exposed skin  Psychiatry: Judgement and insight appear normal. Mood & affect appropriate.   Data Reviewed: I have personally reviewed following labs and imaging studies  CBC: Recent Labs  Lab 02/21/23 1557 02/22/23 0814 02/23/23 0613 02/24/23 0550  WBC 8.2 6.6 7.8  6.9  NEUTROABS  --   --  6.4 5.2  HGB 15.1 14.1 12.6* 11.9*  HCT 44.3 41.9 38.0* 35.7*  MCV 89.3 90.1 91.1 90.6  PLT 117* 73* 65* 70*   Basic Metabolic Panel: Recent Labs  Lab 02/21/23 1557 02/22/23 0814 02/23/23 0613 02/24/23 0550  NA 132* 131* 129* 125*  K 3.8 3.5 3.0* 2.8*  CL 97* 95* 96* 94*  CO2 GLUCOSE 269* 251* 190* 193*  BUN CREATININE 0.90 0.85 1.06 1.09  CALCIUM 9.1 8.8* 8.0* 7.6*  MG  --   --  1.8 2.3   GFR: Estimated Creatinine Clearance: 73.4 mL/min (by C-G formula based on SCr of 1.09 mg/dL). Liver Function Tests: Recent Labs  Lab 02/21/23 1557 02/22/23 0814 02/23/23 0613 02/24/23 0550  AST 51* 44* 54* 74*  ALT 41 35 41 49*  ALKPHOS 50 50 55 73  BILITOT 2.6* 2.0* 1.9* 1.5*  PROT 7.2 6.5 6.1* 6.1*  ALBUMIN 3.9 3.3* 3.0* 2.7*   Recent Labs  Lab 02/22/23 0814  LIPASE 20   No results for input(s): "AMMONIA" in the last 168 hours. Coagulation Profile: Recent Labs  Lab 02/21/23 1557  INR 1.2   Cardiac Enzymes: No results for input(s): "CKTOTAL", "CKMB", "CKMBINDEX", "TROPONINI" in the last 168 hours. BNP (last 3 results) No results for input(s): "PROBNP" in the last 8760 hours. HbA1C: Recent Labs    02/21/23 1557  HGBA1C 8.6*   CBG: Recent Labs  Lab 02/23/23 1152 02/23/23 1645 02/23/23 2058 02/24/23 0804 02/24/23 1158  GLUCAP 233* 176* 275* 178* 168*   Lipid Profile: No results for input(s): "CHOL", "HDL", "LDLCALC", "TRIG", "CHOLHDL", "LDLDIRECT" in the last 72 hours. Thyroid Function Tests: Recent Labs    02/24/23 0820  TSH 3.578   Anemia Panel: No results for input(s): "VITAMINB12", "FOLATE", "FERRITIN", "TIBC", "IRON", "RETICCTPCT" in the last 72 hours. Sepsis Labs: No results for input(s): "PROCALCITON", "LATICACIDVEN" in the last 168 hours.  No results found for this or any previous visit (from the past 240 hour(s)).    Radiology Studies: No results found.    Scheduled Meds:   allopurinol  150 mg Oral Daily   aspirin EC  81 mg Oral QHS   docusate sodium  100 mg Oral BID   enoxaparin (LOVENOX) injection  40 mg Subcutaneous Q24H   ezetimibe  10 mg Oral Daily   feeding supplement  1 Container Oral TID BM   insulin aspart  0-15 Units Subcutaneous TID WC   insulin aspart  0-5 Units Subcutaneous QHS   insulin glargine-yfgn  10 Units Subcutaneous Daily   latanoprost  1 drop Both Eyes QHS   levothyroxine  100 mcg Oral QAC breakfast   metoprolol tartrate  25 mg Oral BID   pantoprazole  40 mg Oral QHS   polyethylene glycol  17 g Oral Daily   potassium chloride  40 mEq Oral Q4H   Continuous Infusions:  lactated ringers 75 mL/hr at 02/24/23 0800   piperacillin-tazobactam (ZOSYN)  IV 3.375 g (02/24/23 0523)     LOS: 3 days   Time spent: 35 minutes   Noralee Stain, DO [facility] 02/24/2023, 1:29 PM  Available via Epic secure chat 7am-7pm After these hours, please refer to coverage provider listed on [email or web address]

## 2023-02-24 NOTE — H&P (View-Only) (Signed)
Subjective: CC: Improved epigastrium/ruq. No n/v. Tolerating fld. Passing flatus. No bm.    Objective: Vital signs in last 24 hours: Temp:  [97.8 F (36.6 C)-99 F (37.2 C)] 98.4 F (36.9 C) (04/17 0521) Pulse Rate:  [73-80] 74 (04/17 0521) Resp:  [14-20] 14 (04/17 0521) BP: (93-121)/(50-88) 121/69 (04/17 0521) SpO2:  [95 %-97 %] 97 % (04/17 0521) Last BM Date : 02/20/23  Intake/Output from previous day: 04/16 0701 - 04/17 0700 In: 120 [P.O.:120] Out: 1075 [Urine:1075] Intake/Output this shift: No intake/output data recorded.  PE: Gen:  Alert, NAD, pleasant Pulm:  Rate and effort normal Abd: Soft, ND, epigastric and RUQ ttp. +BS Psych: A&Ox3  Lab Results:  Recent Labs    02/23/23 0613 02/24/23 0550  WBC 7.8 6.9  HGB 12.6* 11.9*  HCT 38.0* 35.7*  PLT 65* 70*    BMET Recent Labs    02/23/23 0613 02/24/23 0550  NA 129* 125*  K 3.0* 2.8*  CL 96* 94*  CO2 24 22  GLUCOSE 190* 193*  BUN 14 20  CREATININE 1.06 1.09  CALCIUM 8.0* 7.6*    PT/INR Recent Labs    02/21/23 1557  LABPROT 15.1  INR 1.2    CMP     Component Value Date/Time   NA 125 (L) 02/24/2023 0550   NA 138 02/04/2022 1104   K 2.8 (L) 02/24/2023 0550   CL 94 (L) 02/24/2023 0550   CO2 22 02/24/2023 0550   GLUCOSE 193 (H) 02/24/2023 0550   BUN 20 02/24/2023 0550   BUN 14 02/04/2022 1104   CREATININE 1.09 02/24/2023 0550   CREATININE 0.78 05/07/2016 0736   CALCIUM 7.6 (L) 02/24/2023 0550   PROT 6.1 (L) 02/24/2023 0550   PROT 6.6 02/04/2022 1104   ALBUMIN 2.7 (L) 02/24/2023 0550   ALBUMIN 4.6 02/04/2022 1104   AST 74 (H) 02/24/2023 0550   ALT 49 (H) 02/24/2023 0550   ALKPHOS 73 02/24/2023 0550   BILITOT 1.5 (H) 02/24/2023 0550   BILITOT 0.9 02/04/2022 1104   GFRNONAA >60 02/24/2023 0550   GFRAA >60 05/20/2016 0550   Lipase     Component Value Date/Time   LIPASE 20 02/22/2023 0814    Studies/Results: No results found.       Anti-infectives: Anti-infectives  (From admission, onward)    Start     Dose/Rate Route Frequency Ordered Stop   02/21/23 1300  piperacillin-tazobactam (ZOSYN) IVPB 3.375 g        3.375 g 12.5 mL/hr over 240 Minutes Intravenous Every 8 hours 02/21/23 1141          Assessment/Plan Acute Cholecystitis - CT from outside hospital reviewed and c/w acute cholecystitis - Cont abx - LFT's stable/downtrending. Continue to trend - Lipase elevated at 122 at [facility] > normalized  - Hold plavix, last dose 4/13am - Cardiology consult for preoperative evaluation/clearance appreciate - moderate risk for perioperative complications. No further w/u recommended.  - With thrombocytopenia (plt 73 > 65 > 70) would wait full 5d after last dose of plavix before proceeding with lap chole. Plan for lap chole on 4/18. Type and screen done. Depending on am labs, may need platelets/blood products available on call to OR. Patient consented to transfusion of blood products if needed.  - Plan OR in the AM. I have explained the procedure, risks, and aftercare of Laparoscopic cholecystectomy.  Risks include but are not limited to anesthesia (MI, CVA, death, prolonged intubation and aspiration), bleeding, infection, wound  problems, hernia, bile leak, injury to common bile duct/liver/intestine, possible need for subtotal cholecystectomy or open cholecystectomy, increased risk of DVT/PE and diarrhea post op. He seems to understand and agrees to proceed.  FEN - FLD, IVF per [facility]. Hypokalemia - replace K (2.8) VTE - SCDs, Lovenox ID - Zosyn  - Per [facility] -  CAD s/p CABG x 2 on DAPT  HTN HLD GERD/remote hx Gastric Ulcer - PPI Hypothyroidism Thrombocytopenia - Hx of similar on prior labs. He has never seen a hematologist.   I reviewed nursing notes, hospitalist notes, cardiology notes, last 24 h vitals and pain scores, last 48 h intake and output, last 24 h labs and trends, and last 24 h imaging results.   LOS: 3 days    Jairy Angulo M Ayah Cozzolino ,  PA-C [facility] 02/24/2023, 8:50 AM Please see Amion for pager number during day hours 7:00am-4:30pm  

## 2023-02-25 ENCOUNTER — Encounter (HOSPITAL_COMMUNITY)
Disposition: A | Discharge status: Home / Self Care | Payer: Self-pay | Source: Other Acute Inpatient Hospital | Attending: Internal Medicine

## 2023-02-25 ENCOUNTER — Inpatient Hospital Stay (HOSPITAL_COMMUNITY): Payer: Medicare PPO | Admitting: Certified Registered"

## 2023-02-25 ENCOUNTER — Encounter (HOSPITAL_COMMUNITY): Payer: Self-pay | Admitting: Internal Medicine

## 2023-02-25 ENCOUNTER — Inpatient Hospital Stay (HOSPITAL_COMMUNITY): Payer: Medicare PPO

## 2023-02-25 ENCOUNTER — Other Ambulatory Visit: Payer: Self-pay

## 2023-02-25 DIAGNOSIS — K810 Acute cholecystitis: Secondary | ICD-10-CM | POA: Diagnosis not present

## 2023-02-25 DIAGNOSIS — I10 Essential (primary) hypertension: Secondary | ICD-10-CM

## 2023-02-25 DIAGNOSIS — Z87891 Personal history of nicotine dependence: Secondary | ICD-10-CM

## 2023-02-25 DIAGNOSIS — Z951 Presence of aortocoronary bypass graft: Secondary | ICD-10-CM

## 2023-02-25 DIAGNOSIS — I25119 Atherosclerotic heart disease of native coronary artery with unspecified angina pectoris: Secondary | ICD-10-CM

## 2023-02-25 HISTORY — PX: CHOLECYSTECTOMY: SHX55

## 2023-02-25 LAB — CBC WITH DIFFERENTIAL/PLATELET
Abs Immature Granulocytes: 0.04 10*3/uL (ref 0.00–0.07)
Basophils Absolute: 0 10*3/uL (ref 0.0–0.1)
Basophils Relative: 1 %
Eosinophils Absolute: 0.1 10*3/uL (ref 0.0–0.5)
Eosinophils Relative: 1 %
HCT: 37.6 % — ABNORMAL LOW (ref 39.0–52.0)
Hemoglobin: 12.1 g/dL — ABNORMAL LOW (ref 13.0–17.0)
Immature Granulocytes: 1 %
Lymphocytes Relative: 11 %
Lymphs Abs: 0.7 10*3/uL (ref 0.7–4.0)
MCH: 29.4 pg (ref 26.0–34.0)
MCHC: 32.2 g/dL (ref 30.0–36.0)
MCV: 91.5 fL (ref 80.0–100.0)
Monocytes Absolute: 0.9 10*3/uL (ref 0.1–1.0)
Monocytes Relative: 13 %
Neutro Abs: 4.9 10*3/uL (ref 1.7–7.7)
Neutrophils Relative %: 73 %
Platelets: 89 10*3/uL — ABNORMAL LOW (ref 150–400)
RBC: 4.11 MIL/uL — ABNORMAL LOW (ref 4.22–5.81)
RDW: 13.3 % (ref 11.5–15.5)
WBC: 6.6 10*3/uL (ref 4.0–10.5)
nRBC: 0 % (ref 0.0–0.2)

## 2023-02-25 LAB — COMPREHENSIVE METABOLIC PANEL
ALT: 78 U/L — ABNORMAL HIGH (ref 0–44)
AST: 102 U/L — ABNORMAL HIGH (ref 15–41)
Albumin: 2.7 g/dL — ABNORMAL LOW (ref 3.5–5.0)
Alkaline Phosphatase: 129 U/L — ABNORMAL HIGH (ref 38–126)
Anion gap: 9 (ref 5–15)
BUN: 15 mg/dL (ref 8–23)
CO2: 22 mmol/L (ref 22–32)
Calcium: 8.2 mg/dL — ABNORMAL LOW (ref 8.9–10.3)
Chloride: 97 mmol/L — ABNORMAL LOW (ref 98–111)
Creatinine, Ser: 0.94 mg/dL (ref 0.61–1.24)
GFR, Estimated: >60 mL/min (ref >60)
Glucose, Bld: 183 mg/dL — ABNORMAL HIGH (ref 70–99)
Potassium: 4.3 mmol/L (ref 3.5–5.1)
Sodium: 128 mmol/L — ABNORMAL LOW (ref 135–145)
Total Bilirubin: 2.1 mg/dL — ABNORMAL HIGH (ref 0.3–1.2)
Total Protein: 6.4 g/dL — ABNORMAL LOW (ref 6.5–8.1)

## 2023-02-25 LAB — GLUCOSE, CAPILLARY
Glucose-Capillary: 158 mg/dL — ABNORMAL HIGH (ref 70–99)
Glucose-Capillary: 161 mg/dL — ABNORMAL HIGH (ref 70–99)
Glucose-Capillary: 177 mg/dL — ABNORMAL HIGH (ref 70–99)
Glucose-Capillary: 183 mg/dL — ABNORMAL HIGH (ref 70–99)
Glucose-Capillary: 222 mg/dL — ABNORMAL HIGH (ref 70–99)
Glucose-Capillary: 187 mg/dL — ABNORMAL HIGH (ref 70–99)

## 2023-02-25 LAB — MAGNESIUM: Magnesium: 2.5 mg/dL — ABNORMAL HIGH (ref 1.7–2.4)

## 2023-02-25 SURGERY — LAPAROSCOPIC CHOLECYSTECTOMY WITH INTRAOPERATIVE CHOLANGIOGRAM
Anesthesia: General

## 2023-02-25 MED ORDER — DEXAMETHASONE SODIUM PHOSPHATE 10 MG/ML IJ SOLN
INTRAMUSCULAR | Status: AC
  Filled 2023-02-25: qty 1

## 2023-02-25 MED ORDER — ACETAMINOPHEN 10 MG/ML IV SOLN: 1000.0000 mg | Freq: Once | INTRAVENOUS | Status: DC | PRN

## 2023-02-25 MED ORDER — PHENYLEPHRINE HCL (PRESSORS) 10 MG/ML IV SOLN
INTRAVENOUS | Status: AC
  Filled 2023-02-25: qty 1

## 2023-02-25 MED ORDER — PROPOFOL 10 MG/ML IV BOLUS
INTRAVENOUS | Status: DC | PRN
  Administered 2023-02-25: 140 mg via INTRAVENOUS

## 2023-02-25 MED ORDER — FENTANYL CITRATE (PF) 100 MCG/2ML IJ SOLN
INTRAMUSCULAR | Status: AC
  Filled 2023-02-25: qty 2

## 2023-02-25 MED ORDER — ONDANSETRON HCL 4 MG/2ML IJ SOLN: 4.0000 mg | Freq: Once | INTRAMUSCULAR | Status: DC | PRN

## 2023-02-25 MED ORDER — BUPIVACAINE HCL (PF) 0.25 % IJ SOLN
INTRAMUSCULAR | Status: AC
  Filled 2023-02-25: qty 30

## 2023-02-25 MED ORDER — LACTATED RINGERS IR SOLN
Status: DC | PRN
  Administered 2023-02-25: 1000 mL

## 2023-02-25 MED ORDER — LIDOCAINE HCL (PF) 2 % IJ SOLN
INTRAMUSCULAR | Status: AC
  Filled 2023-02-25: qty 5

## 2023-02-25 MED ORDER — CHLORHEXIDINE GLUCONATE 0.12 % MT SOLN
15.0000 mL | Freq: Once | OROMUCOSAL | Status: AC
  Administered 2023-02-25: 15 mL via OROMUCOSAL

## 2023-02-25 MED ORDER — ROCURONIUM BROMIDE 10 MG/ML (PF) SYRINGE
PREFILLED_SYRINGE | INTRAVENOUS | Status: AC
  Filled 2023-02-25: qty 10

## 2023-02-25 MED ORDER — 0.9 % SODIUM CHLORIDE (POUR BTL) OPTIME
TOPICAL | Status: DC | PRN
  Administered 2023-02-25: 1000 mL

## 2023-02-25 MED ORDER — ONDANSETRON HCL 4 MG/2ML IJ SOLN
INTRAMUSCULAR | Status: AC
  Filled 2023-02-25: qty 2

## 2023-02-25 MED ORDER — DEXMEDETOMIDINE HCL IN NACL 80 MCG/20ML IV SOLN
INTRAVENOUS | Status: DC | PRN
  Administered 2023-02-25: 12 ug via BUCCAL

## 2023-02-25 MED ORDER — LIDOCAINE 2% (20 MG/ML) 5 ML SYRINGE
INTRAMUSCULAR | Status: DC | PRN
  Administered 2023-02-25: 100 mg via INTRAVENOUS

## 2023-02-25 MED ORDER — METHOCARBAMOL 1000 MG/10ML IJ SOLN: 500.0000 mg | Freq: Four times a day (QID) | INTRAVENOUS | Status: DC | PRN

## 2023-02-25 MED ORDER — DEXAMETHASONE SODIUM PHOSPHATE 10 MG/ML IJ SOLN
INTRAMUSCULAR | Status: DC | PRN
  Administered 2023-02-25: 4 mg via INTRAVENOUS

## 2023-02-25 MED ORDER — PHENYLEPHRINE 80 MCG/ML (10ML) SYRINGE FOR IV PUSH (FOR BLOOD PRESSURE SUPPORT)
PREFILLED_SYRINGE | INTRAVENOUS | Status: DC | PRN
  Administered 2023-02-25 (×4): 80 ug via INTRAVENOUS

## 2023-02-25 MED ORDER — SUGAMMADEX SODIUM 200 MG/2ML IV SOLN
INTRAVENOUS | Status: DC | PRN
  Administered 2023-02-25: 200 mg via INTRAVENOUS

## 2023-02-25 MED ORDER — FENTANYL CITRATE PF 50 MCG/ML IJ SOSY: 25.0000 ug | PREFILLED_SYRINGE | INTRAMUSCULAR | Status: DC | PRN

## 2023-02-25 MED ORDER — BUPIVACAINE HCL (PF) 0.25 % IJ SOLN
INTRAMUSCULAR | Status: DC | PRN
  Administered 2023-02-25: 30 mL

## 2023-02-25 MED ORDER — LACTATED RINGERS IV SOLN: INTRAVENOUS | Status: DC

## 2023-02-25 MED ORDER — ONDANSETRON HCL 4 MG/2ML IJ SOLN
INTRAMUSCULAR | Status: DC | PRN
  Administered 2023-02-25: 4 mg via INTRAVENOUS

## 2023-02-25 MED ORDER — ROCURONIUM BROMIDE 10 MG/ML (PF) SYRINGE
PREFILLED_SYRINGE | INTRAVENOUS | Status: DC | PRN
  Administered 2023-02-25: 50 mg via INTRAVENOUS
  Administered 2023-02-25: 10 mg via INTRAVENOUS

## 2023-02-25 MED ORDER — FENTANYL CITRATE (PF) 100 MCG/2ML IJ SOLN
INTRAMUSCULAR | Status: DC | PRN
  Administered 2023-02-25 (×2): 50 ug via INTRAVENOUS

## 2023-02-25 MED ORDER — EPHEDRINE SULFATE-NACL 50-0.9 MG/10ML-% IV SOSY
PREFILLED_SYRINGE | INTRAVENOUS | Status: DC | PRN
  Administered 2023-02-25 (×2): 5 mg via INTRAVENOUS

## 2023-02-25 MED ORDER — MORPHINE SULFATE (PF) 2 MG/ML IV SOLN
2.0000 mg | INTRAVENOUS | Status: DC | PRN
  Administered 2023-02-25: 2 mg via INTRAVENOUS
  Filled 2023-02-25: qty 1
  Filled 2023-02-25: qty 2

## 2023-02-25 MED ORDER — ACETAMINOPHEN 325 MG PO TABS
650.0000 mg | ORAL_TABLET | Freq: Four times a day (QID) | ORAL | Status: DC | PRN
  Administered 2023-02-25 (×2): 650 mg via ORAL
  Filled 2023-02-25 (×2): qty 2

## 2023-02-25 MED ORDER — PROPOFOL 10 MG/ML IV BOLUS
INTRAVENOUS | Status: AC
  Filled 2023-02-25: qty 20

## 2023-02-25 SURGICAL SUPPLY — 36 items
APPLIER CLIP 5 13 M/L LIGAMAX5 (MISCELLANEOUS) ×1
BAG COUNTER SPONGE SURGICOUNT (BAG) IMPLANT
CABLE HIGH FREQUENCY MONO STRZ (ELECTRODE) ×1 IMPLANT
CATH REDDICK CHOLANGI 4FR 50CM (CATHETERS) ×1 IMPLANT
CHLORAPREP W/TINT 26 (MISCELLANEOUS) ×1 IMPLANT
CLIP APPLIE 5 13 M/L LIGAMAX5 (MISCELLANEOUS) ×1 IMPLANT
COVER MAYO STAND XLG (MISCELLANEOUS) ×1 IMPLANT
DERMABOND ADVANCED .7 DNX12 (GAUZE/BANDAGES/DRESSINGS) ×1 IMPLANT
DRAIN CHANNEL 19F RND (DRAIN) IMPLANT
DRAPE C-ARM 42X120 X-RAY (DRAPES) ×1 IMPLANT
ELECT REM PT RETURN 15FT ADLT (MISCELLANEOUS) ×1 IMPLANT
EVACUATOR SILICONE 100CC (DRAIN) IMPLANT
GLOVE BIO SURGEON STRL SZ7.5 (GLOVE) ×1 IMPLANT
GOWN STRL REUS W/ TWL LRG LVL3 (GOWN DISPOSABLE) IMPLANT
GOWN STRL REUS W/TWL LRG LVL3 (GOWN DISPOSABLE)
HEMOSTAT SNOW SURGICEL 2X4 (HEMOSTASIS) IMPLANT
HEMOSTAT SURGICEL 4X8 (HEMOSTASIS) IMPLANT
IRRIG SUCT STRYKERFLOW 2 WTIP (MISCELLANEOUS) ×1
IRRIGATION SUCT STRKRFLW 2 WTP (MISCELLANEOUS) ×1 IMPLANT
IV CATH 14GX2 1/4 (CATHETERS) ×1 IMPLANT
KIT BASIN OR (CUSTOM PROCEDURE TRAY) ×1 IMPLANT
KIT TURNOVER KIT A (KITS) IMPLANT
PENCIL SMOKE EVACUATOR (MISCELLANEOUS) IMPLANT
SCISSORS LAP 5X35 DISP (ENDOMECHANICALS) ×1 IMPLANT
SET TUBE SMOKE EVAC HIGH FLOW (TUBING) ×1 IMPLANT
SLEEVE Z-THREAD 5X100MM (TROCAR) ×2 IMPLANT
SPIKE FLUID TRANSFER (MISCELLANEOUS) ×1 IMPLANT
SUT ETHILON 2 0 PS N (SUTURE) IMPLANT
SUT MNCRL AB 4-0 PS2 18 (SUTURE) ×1 IMPLANT
SYS BAG RETRIEVAL 10MM (BASKET) ×1
SYSTEM BAG RETRIEVAL 10MM (BASKET) ×1 IMPLANT
TOWEL OR 17X26 10 PK STRL BLUE (TOWEL DISPOSABLE) ×1 IMPLANT
TOWEL OR NON WOVEN STRL DISP B (DISPOSABLE) ×1 IMPLANT
TRAY LAPAROSCOPIC (CUSTOM PROCEDURE TRAY) ×1 IMPLANT
TROCAR BALLN 12MMX100 BLUNT (TROCAR) ×1 IMPLANT
TROCAR Z-THREAD OPTICAL 5X100M (TROCAR) ×1 IMPLANT

## 2023-02-25 NOTE — Progress Notes (Signed)
PT Cancellation Note  Patient Details Name: Miguel Vasquez MRN: [id number] DOB: Mar 11, 1946   Cancelled Treatment:    Reason Eval/Treat Not Completed: Other (comment) Pt had surgery today and has been up with nursing and OT.  Spoke with RN and she reports she just got pt back to bed and gave him morphine.  Recommended coming tomorrow.  PT agrees. Will f/u later date.  Anise Salvo, PT Acute Rehab [facility] [phone]   Rayetta Humphrey 02/25/2023, 5:24 PM

## 2023-02-25 NOTE — Interval H&P Note (Signed)
History and Physical Interval Note:  02/25/2023 10:01 AM  Miguel Vasquez  has presented today for surgery, with the diagnosis of ACUTE CHOLECYSTITIS.  The various methods of treatment have been discussed with the patient and family. After consideration of risks, benefits and other options for treatment, the patient has consented to  Procedure(s): LAPAROSCOPIC CHOLECYSTECTOMY WITH PSSIBLE INTRAOPERATIVE CHOLANGIOGRAM (N/A) as a surgical intervention.  The patient's history has been reviewed, patient examined, no change in status, stable for surgery.  I have reviewed the patient's chart and labs.  Questions were answered to the patient's satisfaction.     Chevis Pretty III

## 2023-02-25 NOTE — Op Note (Signed)
02/21/2023 - 02/25/2023  11:58 AM  PATIENT:  Miguel Vasquez  77 y.o. male  PRE-OPERATIVE DIAGNOSIS:  ACUTE CHOLECYSTITIS  POST-OPERATIVE DIAGNOSIS:  ACUTE GANGRENOUS CHOLECYSTITIS  PROCEDURE:  Procedure(s): LAPAROSCOPIC CHOLECYSTECTOMY (N/A)  SURGEON:  Surgeon(s) and Role:    * Griselda Miner, MD - Primary    Karie Soda, MD - Assisting  PHYSICIAN ASSISTANT:   ASSISTANTS: Dr. Michaell Cowing   ANESTHESIA:   local and general  EBL:  100 mL   BLOOD ADMINISTERED:none  DRAINS: (1) Blake drain(s) in the gallbladder bed of liver    LOCAL MEDICATIONS USED:  MARCAINE     SPECIMEN:  Source of Specimen:  gallbladder  DISPOSITION OF SPECIMEN:  PATHOLOGY  COUNTS:  YES  TOURNIQUET:  * No tourniquets in log *  DICTATION: .Dragon Dictation    Procedure: After informed consent was obtained the patient was brought to the operating room and placed in the supine position on the operating room table. After adequate induction of general anesthesia the patient's abdomen was prepped with ChloraPrep allowed to dry and draped in usual sterile manner. An appropriate timeout was performed. The area below the umbilicus was infiltrated with quarter percent  Marcaine. A small incision was made with a 15 blade knife. The incision was carried down through the subcutaneous tissue bluntly with a hemostat and [employer] retractors. The linea alba was identified. The linea alba was incised with a 15 blade knife and each side was grasped with Coker clamps. The preperitoneal space was then probed with a hemostat until the peritoneum was opened and access was gained to the abdominal cavity. A 0 Vicryl pursestring stitch was placed in the fascia surrounding the opening. A Hassan cannula was then placed through the opening and anchored in place with the previously placed Vicryl purse string stitch. The abdomen was insufflated with carbon dioxide without difficulty. A laparoscope was inserted through the [facility] cannula in the  right upper quadrant was inspected. Next the epigastric region was infiltrated with % Marcaine. A small incision was made with a 15 blade knife. A 5 mm port was placed bluntly through this incision into the abdominal cavity under direct vision. Next 2 sites were chosen laterally on the right side of the abdomen for placement of 5 mm ports. Each of these areas was infiltrated with quarter percent Marcaine. Small stab incisions were made with a 15 blade knife. 5 mm ports were then placed bluntly through these incisions into the abdominal cavity under direct vision without difficulty. A blunt grasper was placed through the lateralmost 5 mm port.  The right upper quadrant was examined and the liver was noted to be cirrhotic.  I was able to bluntly peeled the omentum down off the top of the gallbladder.  The gallbladder was gangrenous.  I was able to aspirate the gallbladder with a nijat suction device.  As we were examining the lower portion of the gallbladder the wall of the gallbladder fell apart and a large amount of pus was evacuated.  From this point I was able to separate the gallbladder from the liver bed sharply with the hook cautery.  A couple of small bleeding areas along the wall of the gallbladder were controlled with clips.  Once the gallbladder was freed from the liver it was placed in a laparoscopic bag and sealed.  The gallbladder and bag were then removed through the infraumbilical port without difficulty.  The port was then replaced in the liver bed was inspected again.  Several small  bleeding areas were controlled with the electrocautery.  I believe I could see a small cuff of the bottom of the gallbladder that was left behind.  The tissue in this area was so necrotic and inflamed it was not able to be controlled any further.  The area was then irrigated with copious amounts of saline until the effluent was clear.  A 19 French round Blake drain was brought into the abdominal cavity through the  lateralmost 5 mm port and the drain was placed along the gallbladder bed of the liver.  The liver bed was covered with Surgicel snow.  At this point the area appeared to be hemostatic and there was no back leak of bile.  The fascial defect was then closed with the previously placed Vicryl pursestring stitch as well as with another figure-of-eight 0 Vicryl stitch. The liver bed was inspected again and found to be hemostatic. The abdomen was irrigated with copious amounts of saline until the effluent was clear. The ports were then removed under direct vision without difficulty and were found to be hemostatic. The gas was allowed to escape. No other abnormalities were noted on general inspection of the abdomen. The skin incisions were all closed with interrupted 4-0 Monocryl subcuticular stitches. Dermabond dressings were applied.  The drain was placed to bulb suction and there was a good seal.  The patient tolerated the procedure well. At the end of the case all needle sponge and instrument counts were correct. The patient was then awakened and taken to recovery in stable condition  PLAN OF CARE: Admit to inpatient   PATIENT DISPOSITION:  PACU - hemodynamically stable.   Delay start of Pharmacological VTE agent (>24hrs) due to surgical blood loss or risk of bleeding: yes

## 2023-02-25 NOTE — Progress Notes (Signed)
PROGRESS NOTE    Miguel Vasquez  ONG:[id number] DOB: Jan 06, 1946 DOA: 02/21/2023 PCP: Benjaman Pott, MD     Brief Narrative:  Miguel Vasquez is a 77 yo male with PMH CAD s/p CABG x 2, HTN, HLD, hypothyroidism, DM II who presented as a transfer from [facility] for acute cholecystitis.  He initially presented there with abdominal pain and nausea but no vomiting. CT abdomen/pelvis was performed on 02/20/2023 which showed cholelithiasis with mild inflammatory changes surrounding the gallbladder and a gallstone visualized in the proximal cystic duct.  He was transferred for further surgery evaluation and possible cholecystectomy.  Cardiology was also consulted on admission for cardiac clearance.  New events last 24 hours / Subjective: Patient complains of some abdominal soreness, no nausea.  Awaiting OR  Assessment & Plan:   Principal Problem:   Acute cholecystitis Active Problems:   Transaminitis   H/O three vessel coronary artery bypass   Hypertension   Hypercholesterolemia   DMII (diabetes mellitus, type 2)   S/P CABG x 3   CAD (coronary artery disease) of artery bypass graft, VG-OM occluded,    Hyponatremia   Acute cholecystitis -Plavix on hold -Cardiology consulted for preop evaluation 4/15 -General surgery following, planning for OR 4/18 -Zosyn  Acute transaminitis -Trend  CAD status post CABG -Cardiology consulted for preop evaluation 4/15 -Continue aspirin, Zetia, Lopressor.  Plavix on hold, resume as soon as able postoperatively  Hypertension -Metoprolol  Diabetes mellitus -Added Semglee.  Sliding scale insulin  Hyperlipidemia -Zetia  Hyponatremia -Serum osmole 277 -Cortisol 24 -TSH 3.578 -Urine sodium 24 -Urine osmole 457 -Patient appears euvolemic.  He is also on ACE inhibitor which can contribute.  I will discontinue this  Hypothyroidism -Synthroid   DVT prophylaxis:  enoxaparin (LOVENOX) injection 40 mg Start: 02/21/23 2200 SCDs Start:  02/21/23 1220  Code Status: Full code Family Communication: Wife at bedside Disposition Plan: Home Status is: Inpatient Remains inpatient appropriate because: Surgery today    Antimicrobials:  Anti-infectives (From admission, onward)    Start     Dose/Rate Route Frequency Ordered Stop   02/21/23 1300  piperacillin-tazobactam (ZOSYN) IVPB 3.375 g        3.375 g 12.5 mL/hr over 240 Minutes Intravenous Every 8 hours 02/21/23 1141          Objective: Vitals:   02/25/23 1215 02/25/23 1230 02/25/23 1245 02/25/23 1300  BP: (!) 142/74 (!) 130/94 (!) 165/90 (!) 168/102  Pulse: 80 81 84 83  Resp: (!) 25 20 (!) 24 20  Temp:      TempSrc:      SpO2: 100% 90% 92% 93%  Weight:      Height:        Intake/Output Summary (Last 24 hours) at 02/25/2023 1327 Last data filed at 02/25/2023 1225 Gross per 24 hour  Intake 2375.84 ml  Output 750 ml  Net 1625.84 ml    Filed Weights   02/21/23 1306  Weight: 98.4 kg    Examination:  General exam: Appears calm and comfortable  Respiratory system: Clear to auscultation. Respiratory effort normal. No respiratory distress. No conversational dyspnea.  Cardiovascular system: S1 & S2 heard, RRR. No murmurs. No pedal edema. Gastrointestinal system: Abdomen is nondistended, soft and not tender to palpation  Central nervous system: Alert and oriented. No focal neurological deficits. Speech clear.  Extremities: Symmetric in appearance  Skin: No rashes, lesions or ulcers on exposed skin  Psychiatry: Judgement and insight appear normal. Mood & affect appropriate.   Data Reviewed:  I have personally reviewed following labs and imaging studies  CBC: Recent Labs  Lab 02/21/23 1557 02/22/23 0814 02/23/23 0613 02/24/23 0550 02/25/23 0717  WBC 8.2 6.6 7.8 6.9 6.6  NEUTROABS  --   --  6.4 5.2 4.9  HGB 15.1 14.1 12.6* 11.9* 12.1*  HCT 44.3 41.9 38.0* 35.7* 37.6*  MCV 89.3 90.1 91.1 90.6 91.5  PLT 117* 73* 65* 70* 89*    Basic Metabolic  Panel: Recent Labs  Lab 02/21/23 1557 02/22/23 0814 02/23/23 0613 02/24/23 0550 02/25/23 0717  NA 132* 131* 129* 125* 128*  K 3.8 3.5 3.0* 2.8* 4.3  CL 97* 95* 96* 94* 97*  CO2 GLUCOSE 269* 251* 190* 193* 183*  BUN CREATININE 0.90 0.85 1.06 1.09 0.94  CALCIUM 9.1 8.8* 8.0* 7.6* 8.2*  MG  --   --  1.8 2.3 2.5*    GFR: Estimated Creatinine Clearance: 85.1 mL/min (by C-G formula based on SCr of 0.94 mg/dL). Liver Function Tests: Recent Labs  Lab 02/21/23 1557 02/22/23 0814 02/23/23 0613 02/24/23 0550 02/25/23 0717  AST 51* 44* 54* 74* 102*  ALT 41 35 41 49* 78*  ALKPHOS 50 50 55 73 129*  BILITOT 2.6* 2.0* 1.9* 1.5* 2.1*  PROT 7.2 6.5 6.1* 6.1* 6.4*  ALBUMIN 3.9 3.3* 3.0* 2.7* 2.7*    Recent Labs  Lab 02/22/23 0814  LIPASE 20    No results for input(s): "AMMONIA" in the last 168 hours. Coagulation Profile: Recent Labs  Lab 02/21/23 1557  INR 1.2    Cardiac Enzymes: No results for input(s): "CKTOTAL", "CKMB", "CKMBINDEX", "TROPONINI" in the last 168 hours. BNP (last 3 results) No results for input(s): "PROBNP" in the last 8760 hours. HbA1C: No results for input(s): "HGBA1C" in the last 72 hours.  CBG: Recent Labs  Lab 02/24/23 1715 02/24/23 2037 02/25/23 0746 02/25/23 1005 02/25/23 1209  GLUCAP 178* 152* 158* 161* 177*    Lipid Profile: No results for input(s): "CHOL", "HDL", "LDLCALC", "TRIG", "CHOLHDL", "LDLDIRECT" in the last 72 hours. Thyroid Function Tests: Recent Labs    02/24/23 0820  TSH 3.578    Anemia Panel: No results for input(s): "VITAMINB12", "FOLATE", "FERRITIN", "TIBC", "IRON", "RETICCTPCT" in the last 72 hours. Sepsis Labs: No results for input(s): "PROCALCITON", "LATICACIDVEN" in the last 168 hours.  Recent Results (from the past 240 hour(s))  Surgical pcr screen     Status: None   Collection Time: 02/24/23  7:46 PM   Specimen: Nasal Mucosa; Nasal Swab  Result Value Ref Range Status    MRSA, PCR NEGATIVE NEGATIVE Final   Staphylococcus aureus NEGATIVE NEGATIVE Final    Comment: (NOTE) The Xpert SA Assay (FDA approved for NASAL specimens in patients 72 years of age and older), is one component of a comprehensive surveillance program. It is not intended to diagnose infection nor to guide or monitor treatment. Performed at [facility], [address], [place], [place] [address]       Radiology Studies: DG C-Arm 1-60 Min-No Report  Result Date: 02/25/2023 Fluoroscopy was utilized by the requesting physician.  No radiographic interpretation.      Scheduled Meds:  allopurinol  150 mg Oral Daily   aspirin EC  81 mg Oral QHS   docusate sodium  100 mg Oral BID   enoxaparin (LOVENOX) injection  40 mg Subcutaneous Q24H   ezetimibe  10 mg Oral Daily   feeding supplement  1 Container Oral  TID BM   insulin aspart  0-15 Units Subcutaneous TID WC   insulin aspart  0-5 Units Subcutaneous QHS   insulin glargine-yfgn  10 Units Subcutaneous Daily   latanoprost  1 drop Both Eyes QHS   levothyroxine  100 mcg Oral QAC breakfast   metoprolol tartrate  25 mg Oral BID   pantoprazole  40 mg Oral QHS   polyethylene glycol  17 g Oral Daily   Continuous Infusions:  lactated ringers 75 mL/hr at 02/24/23 0800   piperacillin-tazobactam (ZOSYN)  IV 3.375 g (02/25/23 0529)     LOS: 4 days   Time spent: 25 minutes   Noralee Stain, DO [facility] 02/25/2023, 1:27 PM   Available via Epic secure chat 7am-7pm After these hours, please refer to coverage provider listed on [email or web address]

## 2023-02-25 NOTE — Care Management Important Message (Signed)
Important Message  Patient Details IM Letter given. Name: Guerin Lashomb MRN: [id number] Date of Birth: 09-05-1946   Medicare Important Message Given:  Yes     Caren Macadam 02/25/2023, 3:14 PM

## 2023-02-25 NOTE — Anesthesia Postprocedure Evaluation (Signed)
Anesthesia Post Note  Patient: Rosendo Couser  Procedure(s) Performed: LAPAROSCOPIC CHOLECYSTECTOMY     Patient location during evaluation: PACU Anesthesia Type: General Level of consciousness: awake and alert Pain management: pain level controlled Vital Signs Assessment: post-procedure vital signs reviewed and stable Respiratory status: spontaneous breathing, nonlabored ventilation, respiratory function stable and patient connected to nasal cannula oxygen Cardiovascular status: blood pressure returned to baseline and stable Postop Assessment: no apparent nausea or vomiting Anesthetic complications: no  No notable events documented.  Last Vitals:  Vitals:   02/25/23 1520 02/25/23 2052  BP:  (!) 146/82  Pulse:  68  Resp:  14  Temp:  36.6 C  SpO2: 96% 96%    Last Pain:  Vitals:   02/25/23 2052  TempSrc: Oral  PainSc:                  Trevor Iha

## 2023-02-25 NOTE — Anesthesia Procedure Notes (Signed)
Procedure Name: Intubation Date/Time: 02/25/2023 10:46 AM  Performed by: Sindy Guadeloupe, CRNAPre-anesthesia Checklist: Patient identified, Emergency Drugs available, Suction available, Patient being monitored and Timeout performed Patient Re-evaluated:Patient Re-evaluated prior to induction Oxygen Delivery Method: Circle system utilized Preoxygenation: Pre-oxygenation with 100% oxygen Induction Type: IV induction Ventilation: Mask ventilation without difficulty Laryngoscope Size: Mac and 4 Grade View: Grade I Tube type: Oral Tube size: 7.5 mm Number of attempts: 1 Airway Equipment and Method: Stylet Placement Confirmation: ETT inserted through vocal cords under direct vision, positive ETCO2 and breath sounds checked- equal and bilateral Secured at: 23 cm Tube secured with: Tape Dental Injury: Teeth and Oropharynx as per pre-operative assessment

## 2023-02-25 NOTE — Transfer of Care (Signed)
Immediate Anesthesia Transfer of Care Note  Patient: Miguel Vasquez  Procedure(s) Performed: LAPAROSCOPIC CHOLECYSTECTOMY  Patient Location: PACU  Anesthesia Type:General  Level of Consciousness: awake and patient cooperative  Airway & Oxygen Therapy: Patient Spontanous Breathing and Patient connected to face mask oxygen  Post-op Assessment: Report given to RN and Post -op Vital signs reviewed and stable  Post vital signs: Reviewed and stable  Last Vitals:  Vitals Value Taken Time  BP 125/66   Temp    Pulse 81   Resp 21   SpO2 100     Last Pain:  Vitals:   02/25/23 0952  TempSrc:   PainSc: 2       Patients Stated Pain Goal: 0 (02/25/23 0952)  Complications: No notable events documented.

## 2023-02-25 NOTE — Evaluation (Signed)
Occupational Therapy Evaluation Patient Details Name: Miguel Vasquez MRN: [id number] DOB: Mar 24, 1946 Today's Date: 02/25/2023   History of Present Illness 77 yo M adm 4/14 from an outside hospital with acute cholecystitis.  S/p LAPAROSCOPIC CHOLECYSTECTOMY 4/18.   Clinical Impression   Patient admitted for the procedure above.  PTA patient lives with his spouse, and remains Ind with ADL, iADL and mobility.  Patient has been completing sink baths at baseline recently.  Currently he is needing generalized supervision with mobility, Min a with transfers, and Min A with lower body ADL.  OT can follow in the acute setting to address deficits, but no post acute OT is anticipated.  Patient should progress quickly.        Recommendations for follow up therapy are one component of a multi-disciplinary discharge planning process, led by the attending physician.  Recommendations may be updated based on patient status, additional functional criteria and insurance authorization.   Assistance Recommended at Discharge Intermittent Supervision/Assistance  Patient can return home with the following Assist for transportation;Assistance with cooking/housework    Functional Status Assessment  Patient has had a recent decline in their functional status and demonstrates the ability to make significant improvements in function in a reasonable and predictable amount of time.  Equipment Recommendations  None recommended by OT    Recommendations for Other Services       Precautions / Restrictions Precautions Precautions: Fall Precaution Comments: JP drain to R flank Restrictions Weight Bearing Restrictions: No      Mobility Bed Mobility Overal bed mobility: Needs Assistance Bed Mobility: Sidelying to Sit, Sit to Sidelying   Sidelying to sit: Min assist     Sit to sidelying: Min assist General bed mobility comments: assist with legs and log roll technique    Transfers Overall transfer level: Needs  assistance   Transfers: Sit to/from Stand, Bed to chair/wheelchair/BSC Sit to Stand: Min assist     Step pivot transfers: Supervision            Balance Overall balance assessment: Needs assistance Sitting-balance support: Feet supported Sitting balance-Leahy Scale: Good     Standing balance support: Reliant on assistive device for balance Standing balance-Leahy Scale: Fair                             ADL either performed or assessed with clinical judgement   ADL       Grooming: Wash/dry hands;Wash/dry face;Supervision/safety;Standing               Lower Body Dressing: Minimal assistance;Sit to/from stand   Toilet Transfer: Supervision/safety;Rolling walker (2 wheels);Ambulation                   Vision Baseline Vision/History: 1 Wears glasses Patient Visual Report: No change from baseline       Perception     Praxis      Pertinent Vitals/Pain Pain Assessment Pain Assessment: Faces Faces Pain Scale: Hurts even more Pain Location: surgical site Pain Descriptors / Indicators: Burning Pain Intervention(s): Monitored during session, Patient requesting pain meds-RN notified     Hand Dominance Right   Extremity/Trunk Assessment Upper Extremity Assessment Upper Extremity Assessment: Overall WFL for tasks assessed   Lower Extremity Assessment Lower Extremity Assessment: Defer to PT evaluation   Cervical / Trunk Assessment Cervical / Trunk Assessment: Kyphotic   Communication Communication Communication: No difficulties   Cognition Arousal/Alertness: Awake/alert Behavior During Therapy: WFL for tasks assessed/performed Overall Cognitive  Status: Within Functional Limits for tasks assessed                                       General Comments   VSS on RA    Exercises     Shoulder Instructions      Home Living Family/patient expects to be discharged to:: Private residence Living Arrangements:  Spouse/significant other Available Help at Discharge: Family;Available 24 hours/day Type of Home: House Home Access: Stairs to enter [employer] of Steps: 3 to 4   Home Layout: One level     Bathroom Shower/Tub: Tub/shower unit;Walk-in shower   Bathroom Toilet: Standard Bathroom Accessibility: Yes How Accessible: Accessible via walker Home Equipment: None          Prior Functioning/Environment Prior Level of Function : Independent/Modified Independent;Driving                        OT Problem List: Impaired balance (sitting and/or standing);Pain      OT Treatment/Interventions: Self-care/ADL training;Therapeutic activities;Patient/family education;DME and/or AE instruction;Balance training    OT Goals(Current goals can be found in the care plan section) Acute Rehab OT Goals Patient Stated Goal: Return home OT Goal Formulation: With patient Time For Goal Achievement: 03/11/23 Potential to Achieve Goals: Good ADL Goals Pt Will Perform Grooming: Independently;standing Pt Will Perform Lower Body Dressing: Independently;sit to/from stand Pt Will Transfer to Toilet: Independently;ambulating;regular height toilet  OT Frequency: Min 1X/week    Co-evaluation              AM-PAC OT "6 Clicks" Daily Activity     Outcome Measure Help from another person eating meals?: None Help from another person taking care of personal grooming?: A Little Help from another person toileting, which includes using toliet, bedpan, or urinal?: A Little Help from another person bathing (including washing, rinsing, drying)?: A Little Help from another person to put on and taking off regular upper body clothing?: A Little Help from another person to put on and taking off regular lower body clothing?: A Little 6 Click Score: 19   End of Session Equipment Utilized During Treatment: Rolling walker (2 wheels) Nurse Communication: Mobility status;Patient requests pain  meds  Activity Tolerance: Patient tolerated treatment well Patient left: in bed;with call bell/phone within reach;with family/visitor present  OT Visit Diagnosis: Unsteadiness on feet (R26.81)                Time: 5621-3086 OT Time Calculation (min): 22 min Charges:  OT General Charges $OT Visit: 1 Visit OT Evaluation $OT Eval Moderate Complexity: 1 Mod  02/25/2023  RP, OTR/L  Acute Rehabilitation Services  Office:  [phone]   Suzanna Obey 02/25/2023, 2:43 PM

## 2023-02-25 NOTE — Anesthesia Preprocedure Evaluation (Signed)
Anesthesia Evaluation  Patient identified by MRN, date of birth, ID band Patient awake    Reviewed: Allergy & Precautions, NPO status , Patient's Chart, lab work & pertinent test results  Airway Mallampati: II  TM Distance: >3 FB Neck ROM: Full    Dental no notable dental hx. (+) Teeth Intact, Dental Advisory Given   Pulmonary former smoker   Pulmonary exam normal breath sounds clear to auscultation       Cardiovascular hypertension, Pt. on medications + angina  + CAD and + CABG (02/2022)  Normal cardiovascular exam Rhythm:Regular Rate:Normal  02/2022 TEE  Left Ventricle: The left ventricle has normal systolic function, with an  ejection fraction of 55-60%. The cavity size was normal. There is mild  concentric left ventricular hypertrophy.     Neuro/Psych    GI/Hepatic ,GERD  ,,  Endo/Other  diabetes, Well Controlled, Type 2, Oral Hypoglycemic Agents    Renal/GU Lab Results      Component                Value               Date                      CREATININE               0.94                02/25/2023                BUN                      15                  02/25/2023                NA                       128 (L)             02/25/2023                K                        4.3                 02/25/2023                    Musculoskeletal  (+) Arthritis ,    Abdominal   Peds  Hematology Lab Results      Component                Value               Date                      WBC                      6.6                 02/25/2023                HGB                      12.1 (L)  02/25/2023                HCT                      37.6 (L)            02/25/2023                MCV                      91.5                02/25/2023                PLT                      89 (L)              02/25/2023              Anesthesia Other Findings   Reproductive/Obstetrics                              Anesthesia Physical Anesthesia Plan  ASA: 3  Anesthesia Plan: General   Post-op Pain Management: Precedex and Toradol IV (intra-op)*   Induction: Intravenous  PONV Risk Score and Plan: 3 and Treatment may vary due to age or medical condition, Ondansetron, Dexamethasone and Midazolam  Airway Management Planned: Oral ETT  Additional Equipment: None  Intra-op Plan:   Post-operative Plan: Extubation in OR  Informed Consent: I have reviewed the patients History and Physical, chart, labs and discussed the procedure including the risks, benefits and alternatives for the proposed anesthesia with the patient or authorized representative who has indicated his/her understanding and acceptance.     Dental advisory given  Plan Discussed with:   Anesthesia Plan Comments:        Anesthesia Quick Evaluation

## 2023-02-26 ENCOUNTER — Encounter (HOSPITAL_COMMUNITY): Payer: Self-pay | Admitting: General Surgery

## 2023-02-26 DIAGNOSIS — K810 Acute cholecystitis: Secondary | ICD-10-CM | POA: Diagnosis not present

## 2023-02-26 LAB — COMPREHENSIVE METABOLIC PANEL
ALT: 69 U/L — ABNORMAL HIGH (ref 0–44)
AST: 74 U/L — ABNORMAL HIGH (ref 15–41)
Albumin: 2.6 g/dL — ABNORMAL LOW (ref 3.5–5.0)
Alkaline Phosphatase: 134 U/L — ABNORMAL HIGH (ref 38–126)
Anion gap: 9 (ref 5–15)
BUN: 12 mg/dL (ref 8–23)
CO2: 25 mmol/L (ref 22–32)
Calcium: 8.3 mg/dL — ABNORMAL LOW (ref 8.9–10.3)
Chloride: 98 mmol/L (ref 98–111)
Creatinine, Ser: 0.91 mg/dL (ref 0.61–1.24)
GFR, Estimated: >60 mL/min (ref >60)
Glucose, Bld: 195 mg/dL — ABNORMAL HIGH (ref 70–99)
Potassium: 4.5 mmol/L (ref 3.5–5.1)
Sodium: 132 mmol/L — ABNORMAL LOW (ref 135–145)
Total Bilirubin: 1.5 mg/dL — ABNORMAL HIGH (ref 0.3–1.2)
Total Protein: 6.5 g/dL (ref 6.5–8.1)

## 2023-02-26 LAB — CBC WITH DIFFERENTIAL/PLATELET
Abs Immature Granulocytes: 0.03 10*3/uL (ref 0.00–0.07)
Basophils Absolute: 0 10*3/uL (ref 0.0–0.1)
Basophils Relative: 0 %
Eosinophils Absolute: 0 10*3/uL (ref 0.0–0.5)
Eosinophils Relative: 0 %
HCT: 35.7 % — ABNORMAL LOW (ref 39.0–52.0)
Hemoglobin: 11.6 g/dL — ABNORMAL LOW (ref 13.0–17.0)
Immature Granulocytes: 1 %
Lymphocytes Relative: 19 %
Lymphs Abs: 0.8 10*3/uL (ref 0.7–4.0)
MCH: 29.7 pg (ref 26.0–34.0)
MCHC: 32.5 g/dL (ref 30.0–36.0)
MCV: 91.3 fL (ref 80.0–100.0)
Monocytes Absolute: 0.4 10*3/uL (ref 0.1–1.0)
Monocytes Relative: 10 %
Neutro Abs: 3.2 10*3/uL (ref 1.7–7.7)
Neutrophils Relative %: 70 %
Platelets: 104 10*3/uL — ABNORMAL LOW (ref 150–400)
RBC: 3.91 MIL/uL — ABNORMAL LOW (ref 4.22–5.81)
RDW: 13.5 % (ref 11.5–15.5)
WBC: 4.5 10*3/uL (ref 4.0–10.5)
nRBC: 0 % (ref 0.0–0.2)

## 2023-02-26 LAB — GLUCOSE, CAPILLARY
Glucose-Capillary: 200 mg/dL — ABNORMAL HIGH (ref 70–99)
Glucose-Capillary: 154 mg/dL — ABNORMAL HIGH (ref 70–99)
Glucose-Capillary: 185 mg/dL — ABNORMAL HIGH (ref 70–99)
Glucose-Capillary: 211 mg/dL — ABNORMAL HIGH (ref 70–99)

## 2023-02-26 LAB — SURGICAL PATHOLOGY

## 2023-02-26 LAB — MAGNESIUM: Magnesium: 2.5 mg/dL — ABNORMAL HIGH (ref 1.7–2.4)

## 2023-02-26 MED ORDER — CLOPIDOGREL BISULFATE 75 MG PO TABS
75.0000 mg | ORAL_TABLET | Freq: Every day | ORAL | Status: DC
  Administered 2023-02-26 – 2023-02-27 (×2): 75 mg via ORAL
  Filled 2023-02-26 (×2): qty 1

## 2023-02-26 MED ORDER — MORPHINE SULFATE (PF) 2 MG/ML IV SOLN: 2.0000 mg | INTRAVENOUS | Status: DC | PRN

## 2023-02-26 MED ORDER — ACETAMINOPHEN 500 MG PO TABS
1000.0000 mg | ORAL_TABLET | Freq: Three times a day (TID) | ORAL | Status: DC
  Administered 2023-02-26 – 2023-02-27 (×3): 1000 mg via ORAL
  Filled 2023-02-26 (×3): qty 2

## 2023-02-26 MED ORDER — METHOCARBAMOL 500 MG PO TABS: 500.0000 mg | ORAL_TABLET | Freq: Four times a day (QID) | ORAL | Status: DC | PRN

## 2023-02-26 MED ORDER — INSULIN GLARGINE-YFGN 100 UNIT/ML ~~LOC~~ SOLN
12.0000 [IU] | Freq: Every day | SUBCUTANEOUS | Status: DC
  Administered 2023-02-27: 12 [IU] via SUBCUTANEOUS
  Filled 2023-02-26: qty 0.12

## 2023-02-26 NOTE — TOC Progression Note (Signed)
Transition of Care [facility]) - Progression Note    Patient Details  Name: Loukas Antonson MRN: [id number] Date of Birth: Mar 18, 1946  Transition of Care [facility]) CM/SW Contact  Beckie Busing, RN Phone Number:[phone]  02/26/2023, 3:51 PM  Clinical Narrative:     Transition of Care (TOC) Screening Note   Patient Details  Name: Kaspian Muccio Date of Birth: 03-20-1946   Transition of Care [facility]) CM/SW Contact:    Beckie Busing, RN Phone Number: 02/26/2023, 3:52 PM    Transition of Care Department [facility]) has reviewed patient and no TOC needs have been identified at this time. We will continue to monitor patient advancement through interdisciplinary progression rounds. If new patient transition needs arise, please place a TOC consult.          Expected Discharge Plan and Services                                               Social Determinants of Health (SDOH) Interventions SDOH Screenings   Food Insecurity: No Food Insecurity (02/21/2023)  Housing: Low Risk  (02/21/2023)  Transportation Needs: No Transportation Needs (02/21/2023)  Utilities: Not At Risk (02/21/2023)  Tobacco Use: Medium Risk (02/26/2023)    Readmission Risk Interventions    03/09/2022   12:03 PM  Readmission Risk Prevention Plan  Post Dischage Appt Complete  Medication Screening Complete  Transportation Screening Complete

## 2023-02-26 NOTE — Progress Notes (Signed)
PROGRESS NOTE    Griselda Tosh  ZOX:[id number] DOB: April 21, 1946 DOA: 02/21/2023 PCP: Benjaman Pott, MD     Brief Narrative:  Miguel Vasquez is a 77 yo male with PMH CAD s/p CABG x 2, HTN, HLD, hypothyroidism, DM II who presented as a transfer from [facility] for acute cholecystitis.  He initially presented there with abdominal pain and nausea but no vomiting. CT abdomen/pelvis was performed on 02/20/2023 which showed cholelithiasis with mild inflammatory changes surrounding the gallbladder and a gallstone visualized in the proximal cystic duct.  He was transferred for further surgery evaluation and possible cholecystectomy.  Cardiology was also consulted on admission for cardiac clearance.  Patient underwent lap chole with Dr. Carolynne Edouard 4/18.  They found acute gangrenous cholecystitis.  New events last 24 hours / Subjective: Patient feeling well, some soreness around the incision site  Assessment & Plan:   Principal Problem:   Acute cholecystitis Active Problems:   Transaminitis   H/O three vessel coronary artery bypass   Hypertension   Hypercholesterolemia   DMII (diabetes mellitus, type 2)   S/P CABG x 3   CAD (coronary artery disease) of artery bypass graft, VG-OM occluded,    Hyponatremia   Acute gangrenous cholecystitis -Plavix on hold -resume once okay by general surgery -Status post lap chole 4/18 by Dr. Carolynne Edouard -JP drain in place, Zosyn  Acute transaminitis -Trend  CAD status post CABG -Cardiology consulted for preop evaluation 4/15 -Continue aspirin, Zetia, Lopressor.  Plavix on hold, resume as soon as able postoperatively  Hypertension -Metoprolol  Diabetes mellitus -Semglee dose increased today.  Sliding scale insulin  Hyperlipidemia -Zetia  Hyponatremia -Serum osmole 277 -Cortisol 24 -TSH 3.578 -Urine sodium 24 -Urine osmole 457 -Patient appears euvolemic.  He is also on ACE inhibitor which can contribute.  I will discontinue  this -Improved  Hypothyroidism -Synthroid   DVT prophylaxis:  enoxaparin (LOVENOX) injection 40 mg Start: 02/21/23 2200 SCDs Start: 02/21/23 1220  Code Status: Full code Family Communication: Wife at bedside Disposition Plan: Home Status is: Inpatient Remains inpatient appropriate because: Surgery today    Antimicrobials:  Anti-infectives (From admission, onward)    Start     Dose/Rate Route Frequency Ordered Stop   02/21/23 1300  piperacillin-tazobactam (ZOSYN) IVPB 3.375 g        3.375 g 12.5 mL/hr over 240 Minutes Intravenous Every 8 hours 02/21/23 1141          Objective: Vitals:   02/25/23 1445 02/25/23 1520 02/25/23 2052 02/26/23 0514  BP: (!) 146/81  (!) 146/82 (!) 143/77  Pulse: 78  68 68  Resp:   14 14  Temp: 97.8 F (36.6 C)  97.8 F (36.6 C) 97.7 F (36.5 C)  TempSrc: Oral  Oral Oral  SpO2: 90% 96% 96% 97%  Weight:      Height:        Intake/Output Summary (Last 24 hours) at 02/26/2023 1325 Last data filed at 02/26/2023 1123 Gross per 24 hour  Intake 50 ml  Output 2650 ml  Net -2600 ml    Filed Weights   02/21/23 1306  Weight: 98.4 kg    Examination:  General exam: Appears calm and comfortable  Respiratory system: Clear to auscultation. Respiratory effort normal. No respiratory distress. No conversational dyspnea.  Cardiovascular system: S1 & S2 heard, RRR. No murmurs. No pedal edema. Gastrointestinal system: Abdomen is nondistended, soft and not tender to palpation, +JP drain in place  Central nervous system: Alert and oriented. No focal neurological deficits.  Speech clear.  Extremities: Symmetric in appearance  Skin: No rashes, lesions or ulcers on exposed skin  Psychiatry: Judgement and insight appear normal. Mood & affect appropriate.   Data Reviewed: I have personally reviewed following labs and imaging studies  CBC: Recent Labs  Lab 02/22/23 0814 02/23/23 0613 02/24/23 0550 02/25/23 0717 02/26/23 0538  WBC 6.6 7.8 6.9 6.6  4.5  NEUTROABS  --  6.4 5.2 4.9 3.2  HGB 14.1 12.6* 11.9* 12.1* 11.6*  HCT 41.9 38.0* 35.7* 37.6* 35.7*  MCV 90.1 91.1 90.6 91.5 91.3  PLT 73* 65* 70* 89* 104*    Basic Metabolic Panel: Recent Labs  Lab 02/22/23 0814 02/23/23 0613 02/24/23 0550 02/25/23 0717 02/26/23 0538  NA 131* 129* 125* 128* 132*  K 3.5 3.0* 2.8* 4.3 4.5  CL 95* 96* 94* 97* 98  CO2 GLUCOSE 251* 190* 193* 183* 195*  BUN CREATININE 0.85 1.06 1.09 0.94 0.91  CALCIUM 8.8* 8.0* 7.6* 8.2* 8.3*  MG  --  1.8 2.3 2.5* 2.5*    GFR: Estimated Creatinine Clearance: 87.9 mL/min (by C-G formula based on SCr of 0.91 mg/dL). Liver Function Tests: Recent Labs  Lab 02/22/23 0814 02/23/23 0613 02/24/23 0550 02/25/23 0717 02/26/23 0538  AST 44* 54* 74* 102* 74*  ALT 35 41 49* 78* 69*  ALKPHOS 50 55 73 129* 134*  BILITOT 2.0* 1.9* 1.5* 2.1* 1.5*  PROT 6.5 6.1* 6.1* 6.4* 6.5  ALBUMIN 3.3* 3.0* 2.7* 2.7* 2.6*    Recent Labs  Lab 02/22/23 0814  LIPASE 20    No results for input(s): "AMMONIA" in the last 168 hours. Coagulation Profile: Recent Labs  Lab 02/21/23 1557  INR 1.2    Cardiac Enzymes: No results for input(s): "CKTOTAL", "CKMB", "CKMBINDEX", "TROPONINI" in the last 168 hours. BNP (last 3 results) No results for input(s): "PROBNP" in the last 8760 hours. HbA1C: No results for input(s): "HGBA1C" in the last 72 hours.  CBG: Recent Labs  Lab 02/25/23 1349 02/25/23 1613 02/25/23 2056 02/26/23 0707 02/26/23 1124  GLUCAP 183* 222* 187* 200* 154*    Lipid Profile: No results for input(s): "CHOL", "HDL", "LDLCALC", "TRIG", "CHOLHDL", "LDLDIRECT" in the last 72 hours. Thyroid Function Tests: Recent Labs    02/24/23 0820  TSH 3.578    Anemia Panel: No results for input(s): "VITAMINB12", "FOLATE", "FERRITIN", "TIBC", "IRON", "RETICCTPCT" in the last 72 hours. Sepsis Labs: No results for input(s): "PROCALCITON", "LATICACIDVEN" in the last 168  hours.  Recent Results (from the past 240 hour(s))  Surgical pcr screen     Status: None   Collection Time: 02/24/23  7:46 PM   Specimen: Nasal Mucosa; Nasal Swab  Result Value Ref Range Status   MRSA, PCR NEGATIVE NEGATIVE Final   Staphylococcus aureus NEGATIVE NEGATIVE Final    Comment: (NOTE) The Xpert SA Assay (FDA approved for NASAL specimens in patients 63 years of age and older), is one component of a comprehensive surveillance program. It is not intended to diagnose infection nor to guide or monitor treatment. Performed at [facility], [address], [place], [place] [address]       Radiology Studies: DG C-Arm 1-60 Min-No Report  Result Date: 02/25/2023 Fluoroscopy was utilized by the requesting physician.  No radiographic interpretation.      Scheduled Meds:  acetaminophen  1,000 mg Oral Q8H   allopurinol  150 mg Oral Daily   aspirin EC  81 mg Oral  QHS   docusate sodium  100 mg Oral BID   enoxaparin (LOVENOX) injection  40 mg Subcutaneous Q24H   ezetimibe  10 mg Oral Daily   feeding supplement  1 Container Oral TID BM   insulin aspart  0-15 Units Subcutaneous TID WC   insulin aspart  0-5 Units Subcutaneous QHS   [START ON 02/27/2023] insulin glargine-yfgn  12 Units Subcutaneous Daily   latanoprost  1 drop Both Eyes QHS   levothyroxine  100 mcg Oral QAC breakfast   metoprolol tartrate  25 mg Oral BID   pantoprazole  40 mg Oral QHS   polyethylene glycol  17 g Oral Daily   Continuous Infusions:  lactated ringers 75 mL/hr at 02/25/23 1505   piperacillin-tazobactam (ZOSYN)  IV 3.375 g (02/26/23 0513)     LOS: 5 days   Time spent: 25 minutes   Noralee Stain, DO [facility] 02/26/2023, 1:25 PM   Available via Epic secure chat 7am-7pm After these hours, please refer to coverage provider listed on [email or web address]

## 2023-02-26 NOTE — Progress Notes (Signed)
1 Day Post-Op  Subjective: CC: Doing well.  Reports all of his upper abdominal pain that was present prior to surgery has resolved.  Now just some soreness around his drain site.  Well-controlled.  Tolerating CLD without any worsening abdominal pain or nausea/vomiting.  Passing flatus.  No BM.  Voiding since surgery without difficulties.  Has not been out of bed.  Afebrile. No tachycardia or hypotension. WBC wnl. LFT's overall stable. Hgb 11.6 from 11.9. Plt 104.   Objective: Vital signs in last 24 hours: Temp:  [97.7 F (36.5 C)-99 F (37.2 C)] 97.7 F (36.5 C) (04/19 0514) Pulse Rate:  [68-84] 68 (04/19 0514) Resp:  [14-25] 14 (04/19 0514) BP: (125-168)/(66-102) 143/77 (04/19 0514) SpO2:  [90 %-100 %] 97 % (04/19 0514) Last BM Date : 02/24/23  Intake/Output from previous day: 04/18 0701 - 04/19 0700 In: 1124 [P.O.:60; I.V.:1014; IV Piggyback:50] Out: 2050 [Urine:1425; Drains:525; Blood:100] Intake/Output this shift: Total I/O In: -  Out: 350 [Urine:350]  PE: Gen:  Alert, NAD, pleasant Pulm: Rate and effort normal Abd: Soft,  distension, appropriately tender around laparoscopic incisions, no rigidity or guarding and otherwise NT, +BS. Incisions with glue intact appears well and are without drainage, bleeding, or signs of infection. JP drain SS, 525cc/24 hours.  Psych: A&Ox3   Lab Results:  Recent Labs    02/25/23 0717 02/26/23 0538  WBC 6.6 4.5  HGB 12.1* 11.6*  HCT 37.6* 35.7*  PLT 89* 104*   BMET Recent Labs    02/25/23 0717 02/26/23 0538  NA 128* 132*  K 4.3 4.5  CL 97* 98  CO2 22 25  GLUCOSE 183* 195*  BUN 15 12  CREATININE 0.94 0.91  CALCIUM 8.2* 8.3*   PT/INR No results for input(s): "LABPROT", "INR" in the last 72 hours. CMP     Component Value Date/Time   NA 132 (L) 02/26/2023 0538   NA 138 02/04/2022 1104   K 4.5 02/26/2023 0538   CL 98 02/26/2023 0538   CO2 25 02/26/2023 0538   GLUCOSE 195 (H) 02/26/2023 0538   BUN 12 02/26/2023  0538   BUN 14 02/04/2022 1104   CREATININE 0.91 02/26/2023 0538   CREATININE 0.78 05/07/2016 0736   CALCIUM 8.3 (L) 02/26/2023 0538   PROT 6.5 02/26/2023 0538   PROT 6.6 02/04/2022 1104   ALBUMIN 2.6 (L) 02/26/2023 0538   ALBUMIN 4.6 02/04/2022 1104   AST 74 (H) 02/26/2023 0538   ALT 69 (H) 02/26/2023 0538   ALKPHOS 134 (H) 02/26/2023 0538   BILITOT 1.5 (H) 02/26/2023 0538   BILITOT 0.9 02/04/2022 1104   GFRNONAA >60 02/26/2023 0538   GFRAA >60 05/20/2016 0550   Lipase     Component Value Date/Time   LIPASE 20 02/22/2023 0814    Studies/Results: DG C-Arm 1-60 Min-No Report  Result Date: 02/25/2023 Fluoroscopy was utilized by the requesting physician.  No radiographic interpretation.    Anti-infectives: Anti-infectives (From admission, onward)    Start     Dose/Rate Route Frequency Ordered Stop   02/21/23 1300  piperacillin-tazobactam (ZOSYN) IVPB 3.375 g        3.375 g 12.5 mL/hr over 240 Minutes Intravenous Every 8 hours 02/21/23 1141          Assessment/Plan POD 1 s/p laparoscopic cholecystectomy for acute gangrenous cholecystitis by Dr. Carolynne Edouard on 02/25/2023 - Continue abx, 5 days postop - Continue JP drain, currently SS - Advance diet - Mobilize, PT already following.  - Pulm toilet -  Will discuss final plan with MD - anticipate we will restart Plavix today and plan to keep overnight for monitoring of JP drain and repeat labs in the morning.   FEN - Adv to [facility] diet. IVF per [facility] VTE - SCDs, Lovenox ID - Zosyn Foley - None   - Per [facility] -  CAD s/p CABG x 2 on DAPT  HTN HLD GERD/remote hx Gastric Ulcer - PPI Hypothyroidism Thrombocytopenia - Hx of similar on prior labs. He has never seen a hematologist.    LOS: 5 days    Miguel Vasquez , [facility] 02/26/2023, 10:30 AM Please see Amion for pager number during day hours 7:00am-4:30pm

## 2023-02-26 NOTE — Evaluation (Signed)
Physical Therapy Evaluation Patient Details Name: Miguel Vasquez MRN: [id number] DOB: Mar 05, 1946 Today's Date: 02/26/2023  History of Present Illness  Pt is 77 yo male admitted 4/14 from an outside hospital with acute cholecystitis.  S/p LAPAROSCOPIC CHOLECYSTECTOMY 4/18. Pt with hx including CAD s/p CABG x 2, HTN, HLD, hypothyroidism, DM II  Clinical Impression  Pt admitted with above diagnosis.  He was independent at baseline and has support at d/c.  Pt required light min A for supine/sit due to surgery but otherwise was supervision for transfers and ambulation.  Walking 400' with steady gait with supervision for lines.  Noted slight decrease in dorsiflexion on L but pt reports baseline from polio as a child.  Pt is near baseline and  does not require skilled therapy surfaces.        Recommendations for follow up therapy are one component of a multi-disciplinary discharge planning process, led by the attending physician.  Recommendations may be updated based on patient status, additional functional criteria and insurance authorization.  Follow Up Recommendations       Assistance Recommended at Discharge PRN  Patient can return home with the following       Equipment Recommendations None recommended by PT  Recommendations for Other Services       Functional Status Assessment Patient has not had a recent decline in their functional status     Precautions / Restrictions Precautions Precautions: Fall Precaution Comments: JP drain to R flank      Mobility  Bed Mobility Overal bed mobility: Needs Assistance Bed Mobility: Supine to Sit, Sit to Supine     Supine to sit: Min assist Sit to supine: Min assist   General bed mobility comments: Light min A to lift trunk and for legs back to bed    Transfers Overall transfer level: Needs assistance Equipment used: None Transfers: Sit to/from Stand Sit to Stand: Supervision           General transfer comment: supervision for  lines    Ambulation/Gait Ambulation/Gait assistance: Supervision Gait Distance (Feet): 400 Feet Assistive device: IV Pole, None Gait Pattern/deviations: Step-through pattern, Decreased dorsiflexion - left Gait velocity: normal     General Gait Details: Steady gait, cues for safety with IV pole, noted decreased dorsiflexion on L LE -pt reports baseline from polio  Stairs            Wheelchair Mobility    Modified Rankin (Stroke Patients Only)       Balance Overall balance assessment: Needs assistance   Sitting balance-Leahy Scale: Normal Sitting balance - Comments: donned shoes at EOB   Standing balance support: No upper extremity supported Standing balance-Leahy Scale: Good                               Pertinent Vitals/Pain Pain Assessment Pain Assessment: No/denies pain    Home Living Family/patient expects to be discharged to:: Private residence Living Arrangements: Spouse/significant other Available Help at Discharge: Family;Available 24 hours/day Type of Home: House Home Access: Stairs to enter Entrance Stairs-Rails: None Entrance Stairs-Number of Steps: 3 to 4   Home Layout: One level Home Equipment: Agricultural consultant (2 wheels);Cane - single point      Prior Function Prior Level of Function : Independent/Modified Independent;Driving             Mobility Comments: Likes to golf , fish, and watch grandkids play sport       Hand Dominance  Extremity/Trunk Assessment   Upper Extremity Assessment Upper Extremity Assessment: Overall WFL for tasks assessed    Lower Extremity Assessment Lower Extremity Assessment: Overall WFL for tasks assessed    Cervical / Trunk Assessment Cervical / Trunk Assessment: Normal  Communication      Cognition Arousal/Alertness: Awake/alert Behavior During Therapy: Impulsive Overall Cognitive Status: Within Functional Limits for tasks assessed                                  General Comments: Mild impulsivity with IV        General Comments General comments (skin integrity, edema, etc.): VSS; educated on no PT needs but to call for assist with mobility for safety due to lines/leads    Exercises     Assessment/Plan    PT Assessment Patient does not need any further PT services  PT Problem List         PT Treatment Interventions      PT Goals (Current goals can be found in the Care Plan section)  Acute Rehab PT Goals Patient Stated Goal: return home PT Goal Formulation: All assessment and education complete, DC therapy    Frequency       Co-evaluation               AM-PAC PT "6 Clicks" Mobility  Outcome Measure Help needed turning from your back to your side while in a flat bed without using bedrails?: None Help needed moving from lying on your back to sitting on the side of a flat bed without using bedrails?: A Little Help needed moving to and from a bed to a chair (including a wheelchair)?: A Little Help needed standing up from a chair using your arms (e.g., wheelchair or bedside chair)?: A Little Help needed to walk in hospital room?: A Little Help needed climbing 3-5 steps with a railing? : A Little 6 Click Score: 19    End of Session Equipment Utilized During Treatment: Gait belt Activity Tolerance: Patient tolerated treatment well Patient left: in bed;with call bell/phone within reach;with bed alarm set;with SCD's reapplied Nurse Communication: Mobility status PT Visit Diagnosis: Other abnormalities of gait and mobility (R26.89)    Time: 1191-4782 PT Time Calculation (min) (ACUTE ONLY): 22 min   Charges:   PT Evaluation $PT Eval Low Complexity: 1 Low          Indigo Barbian, PT Acute Rehab [facility] Re[facility] [phone]   Rayetta Humphrey 02/26/2023, 4:41 PM

## 2023-02-26 NOTE — Inpatient Diabetes Management (Signed)
Inpatient Diabetes Program Recommendations  AACE/ADA: New Consensus Statement on Inpatient Glycemic Control (2015)  Target Ranges:  Prepandial:   less than 140 mg/dL      Peak postprandial:   less than 180 mg/dL (1-2 hours)      Critically ill patients:  140 - 180 mg/dL   Lab Results  Component Value Date   GLUCAP 200 (H) 02/26/2023   HGBA1C 8.6 (H) 02/21/2023    Review of Glycemic Control  Latest Reference Range & Units 02/25/23 13:49 02/25/23 16:13 02/25/23 20:56 02/26/23 07:07  Glucose-Capillary 70 - 99 mg/dL 409 (H) 811 (H) 914 (H) 200 (H)  (H): Data is abnormally high  Diabetes history: DM2 Outpatient Diabetes medications: Glipizide 5 mg QD, Metformin 1000 mg BID Current orders for Inpatient glycemic control: Semglee 10 units QD, Novolog 0-15 units TID and 0-5 units QHS  Inpatient Diabetes Program Recommendations:    Semglee 12 units QAM  Will continue to follow while inpatient.  Thank you, Dulce Sellar, MSN, CDCES Diabetes Coordinator Inpatient Diabetes Program [phone] (team pager from 8a-5p)

## 2023-02-26 NOTE — Discharge Instructions (Signed)
[facility]  Please arrive at least 30 min before your appointment to complete your check in paperwork.  If you are unable to arrive 30 min prior to your appointment time we may have to cancel or reschedule you. LAPAROSCOPIC SURGERY: POST OP INSTRUCTIONS Always review your discharge instruction sheet given to you by the facility where your surgery was performed. IF YOU HAVE DISABILITY OR FAMILY LEAVE FORMS, YOU MUST BRING THEM TO THE OFFICE FOR PROCESSING.   DO NOT GIVE THEM TO YOUR DOCTOR.  PAIN CONTROL  First take acetaminophen (Tylenol) AND/or ibuprofen (Advil) to control your pain after surgery.  Follow directions on package.  Taking acetaminophen (Tylenol) and/or ibuprofen (Advil) regularly after surgery will help to control your pain and lower the amount of prescription pain medication you may need.  You should not take more than 4,000 mg (4 grams) of acetaminophen (Tylenol) in 24 hours.  You should not take ibuprofen (Advil), aleve, motrin, naprosyn or other NSAIDS if you have a history of stomach ulcers or chronic kidney disease.  A prescription for pain medication may be given to you upon discharge.  Take your pain medication as prescribed, if you still have uncontrolled pain after taking acetaminophen (Tylenol) or ibuprofen (Advil). Use ice packs to help control pain. If you need a refill on your pain medication, please contact your pharmacy.  They will contact our office to request authorization. Prescriptions will not be filled after 5pm or on week-ends.  HOME MEDICATIONS Take your usually prescribed medications unless otherwise directed.  DIET You should follow a light diet the first few days after arrival home.  Be sure to include lots of fluids daily. Avoid fatty, fried foods.   CONSTIPATION It is common to experience some constipation after surgery and if you are taking pain medication.  Increasing fluid intake and taking a stool softener (such as Colace)  will usually help or prevent this problem from occurring.  A mild laxative (Milk of Magnesia or Miralax) should be taken according to package instructions if there are no bowel movements after 48 hours.  WOUND/INCISION CARE Most patients will experience some swelling and bruising in the area of the incisions.  Ice packs will help.  Swelling and bruising can take several days to resolve.  Unless discharge instructions indicate otherwise, follow guidelines below  STERI-STRIPS - you may remove your outer bandages 48 hours after surgery, and you may shower at that time.  You have steri-strips (small skin tapes) in place directly over the incision.  These strips should be left on the skin for 7-10 days.   DERMABOND/SKIN GLUE - you may shower in 24 hours.  The glue will flake off over the next 2-3 weeks. Any sutures or staples will be removed at the office during your follow-up visit.  ACTIVITIES You may resume regular (light) daily activities beginning the next day--such as daily self-care, walking, climbing stairs--gradually increasing activities as tolerated.  You may have sexual intercourse when it is comfortable.  Refrain from any heavy lifting or straining until approved by your doctor. You may drive when you are no longer taking prescription pain medication, you can comfortably wear a seatbelt, and you can safely maneuver your car and apply brakes.  FOLLOW-UP You should see your doctor in the office for a follow-up appointment approximately 2-3 weeks after your surgery.  You should have been given your post-op/follow-up appointment when your surgery was scheduled.  If you did not receive a post-op/follow-up appointment, make sure   that you call for this appointment within a day or two after you arrive home to insure a convenient appointment time.   WHEN TO CALL YOUR DOCTOR: Fever over 101.0 Inability to urinate Continued bleeding from incision. Increased pain, redness, or drainage from the  incision. Increasing abdominal pain  The clinic staff is available to answer your questions during regular business hours.  Please don't hesitate to call and ask to speak to one of the nurses for clinical concerns.  If you have a medical emergency, go to the nearest emergency room or call 911.  A surgeon from [facility] is always on call at the hospital. [address], [address], [place], [place]  [address] ? [address] [address], [place], [place]   [address] [phone] ? [phone] ? FAX [phone]  

## 2023-02-27 DIAGNOSIS — K810 Acute cholecystitis: Secondary | ICD-10-CM | POA: Diagnosis not present

## 2023-02-27 LAB — GLUCOSE, CAPILLARY: Glucose-Capillary: 167 mg/dL — ABNORMAL HIGH (ref 70–99)

## 2023-02-27 MED ORDER — AMOXICILLIN-POT CLAVULANATE 875-125 MG PO TABS: 1.0000 | ORAL_TABLET | Freq: Two times a day (BID) | ORAL | 0 refills | Status: AC

## 2023-02-27 MED ORDER — OXYCODONE HCL 5 MG PO TABS: 5.0000 mg | ORAL_TABLET | ORAL | 0 refills | Status: DC | PRN

## 2023-02-27 NOTE — Discharge Summary (Addendum)
Physician Discharge Summary  Miguel Vasquez VHQ:[id number] DOB: 1946/09/03 DOA: 02/21/2023  PCP: Benjaman Pott, MD  Admit date: 02/21/2023 Discharge date: 02/27/2023  Admitted From: Home Disposition:  Home  Recommendations for Outpatient Follow-up:  Follow up with PCP in 1 week Follow up with General surgery  Repeat CMP to follow sodium and LFTs  Discharge Condition: Stable CODE STATUS: Full  Diet recommendation:  Diet Orders (From admission, onward)     Start     Ordered   02/27/23 0000  Diet - low sodium heart healthy        02/27/23 0945   02/26/23 0805  Diet Heart Room service appropriate? Yes; Fluid consistency: Thin  Diet effective now       Question Answer Comment  Room service appropriate? Yes   Fluid consistency: Thin      02/26/23 0804           Brief/Interim Summary: Miguel Vasquez is a 77 yo male with PMH CAD s/p CABG x 2, HTN, HLD, hypothyroidism, DM II who presented as a transfer from [facility] for acute cholecystitis.  He initially presented there with abdominal pain and nausea but no vomiting. CT abdomen/pelvis was performed on 02/20/2023 which showed cholelithiasis with mild inflammatory changes surrounding the gallbladder and a gallstone visualized in the proximal cystic duct.  He was transferred for further surgery evaluation and possible cholecystectomy.  Cardiology was also consulted on admission for cardiac clearance.  Patient underwent lap chole with Dr. Carolynne Edouard 4/18.  They found acute gangrenous cholecystitis. Patient was monitored post-operatively, plavix resumed. He was discharged home in stable condition with antibiotics.   Discharge Diagnoses:   Principal Problem:   Acute cholecystitis Active Problems:   Transaminitis   H/O three vessel coronary artery bypass   Hypertension   Hypercholesterolemia   DMII (diabetes mellitus, type 2)   S/P CABG x 3   CAD (coronary artery disease) of artery bypass graft, VG-OM occluded,     Hyponatremia    Acute gangrenous cholecystitis -Status post lap chole 4/18 by Dr. Carolynne Edouard -JP drain in place, Augmentin 5 days    Acute transaminitis -Trend   CAD status post CABG -Cardiology consulted for preop evaluation 4/15 -Continue aspirin, Zetia, Lopressor, plavix    Hypertension -Metoprolol   Diabetes mellitus -Resume home meds    Hyperlipidemia -Zetia   Hyponatremia -Serum osmole 277 -Cortisol 24 -TSH 3.578 -Urine sodium 24 -Urine osmole 457 -Patient appears euvolemic -Improved   Hypothyroidism -Synthroid  Discharge Instructions  Discharge Instructions     Call MD for:  difficulty breathing, headache or visual disturbances   Complete by: As directed    Call MD for:  extreme fatigue   Complete by: As directed    Call MD for:  persistant dizziness or light-headedness   Complete by: As directed    Call MD for:  persistant nausea and vomiting   Complete by: As directed    Call MD for:  redness, tenderness, or signs of infection (pain, swelling, redness, odor or green/yellow discharge around incision site)   Complete by: As directed    Call MD for:  severe uncontrolled pain   Complete by: As directed    Call MD for:  temperature >100.4   Complete by: As directed    Diet - low sodium heart healthy   Complete by: As directed    Discharge instructions   Complete by: As directed    You were cared for by a hospitalist during your hospital stay. If  you have any questions about your discharge medications or the care you received while you were in the hospital after you are discharged, you can call the unit and ask to speak with the hospitalist on call if the hospitalist that took care of you is not available. Once you are discharged, your primary care physician will handle any further medical issues. Please note that NO REFILLS for any discharge medications will be authorized once you are discharged, as it is imperative that you return to your primary care  physician (or establish a relationship with a primary care physician if you do not have one) for your aftercare needs so that they can reassess your need for medications and monitor your lab values.   Increase activity slowly   Complete by: As directed       Allergies as of 02/27/2023       Reactions   Statins Other (See Comments)   "Affects liver enzymes"        Medication List     TAKE these medications    acetaminophen 325 MG tablet Commonly known as: TYLENOL Take 2 tablets (650 mg total) by mouth every 6 (six) hours.   allopurinol 300 MG tablet Commonly known as: ZYLOPRIM Take 150 mg by mouth in the morning.   amoxicillin-clavulanate 875-125 MG tablet Commonly known as: AUGMENTIN Take 1 tablet by mouth 2 (two) times daily for 5 days.   aspirin EC 81 MG tablet Take 81 mg by mouth every evening.   clopidogrel 75 MG tablet Commonly known as: Plavix Take 1 tablet (75 mg total) by mouth daily.   ezetimibe 10 MG tablet Commonly known as: ZETIA Take 10 mg by mouth daily.   folic acid 400 MCG tablet Commonly known as: FOLVITE Take 400 mcg by mouth in the morning.   glipiZIDE 5 MG 24 hr tablet Commonly known as: GLUCOTROL XL Take 5 mg by mouth daily with breakfast.   latanoprost 0.005 % ophthalmic solution Commonly known as: XALATAN Place 1 drop into both eyes at bedtime.   levothyroxine 100 MCG tablet Commonly known as: SYNTHROID Take 100 mcg by mouth daily.   metFORMIN 500 MG tablet Commonly known as: GLUCOPHAGE Take 2 tablets (1,000 mg total) by mouth in the morning and at bedtime. Take 2 tablets by mouth 2 times daily with meal.   metoprolol tartrate 25 MG tablet Commonly known as: LOPRESSOR Take 1 tablet (25 mg total) by mouth 2 (two) times daily.   nitroGLYCERIN 0.4 MG SL tablet Commonly known as: NITROSTAT Place 0.4 mg under the tongue every 5 (five) minutes as needed for chest pain. DISSOLVE ONE TABLET UNDER THE TONGUE EVERY 5 MINUTES AS NEEDED  FOR CHEST PAIN.  DO NOT EXCEED A TOTAL OF 3 DOSES IN 15 MINUTES   omega-3 acid ethyl esters 1 g capsule Commonly known as: LOVAZA Take 2 g by mouth 2 (two) times daily.   oxyCODONE 5 MG immediate release tablet Commonly known as: Oxy IR/ROXICODONE Take 1-2 tablets (5-10 mg total) by mouth every 4 (four) hours as needed for moderate pain or severe pain.   pantoprazole 40 MG tablet Commonly known as: PROTONIX Take 1 tablet (40 mg total) by mouth daily. What changed: when to take this   ramipril 10 MG capsule Commonly known as: ALTACE Take 20 mg by mouth in the morning.   rosuvastatin 10 MG tablet Commonly known as: CRESTOR Take 10 mg by mouth in the morning.   Vitamin D3 25 MCG (1000 UT)  Caps Take 1,000 Units by mouth every evening.        Follow-up Information     Maczis, Hedda Slade, PA-C Follow up.   Specialty: General Surgery Why: Please call to confirm your appointment date/time. Please arrive 30 minutes prior to your appointment paperwork. Please bring a copy your foot ID and insurance card. Contact information: [address] [place] [address] [phone]         [facility] Follow up.   Specialty: General Surgery Why: Please call to confirm your appointment date and time. This is a nurse visit for drain check and possible removal.  Please arrive 30 minutes prior to your appointment paperwork.  Please bring a copy your foot ID and insurance card. Contact information: [address] [place] [address] [phone]         Benjaman Pott, MD Follow up.   Specialty: Family Medicine               Allergies  Allergen Reactions   Statins Other (See Comments)    "Affects liver enzymes"    Consultations: General surgery    Procedures/Studies: DG C-Arm 1-60 Min-No Report  Result Date: 02/25/2023 Fluoroscopy was utilized by the requesting physician.  No radiographic interpretation.       Discharge  Exam: Vitals:   02/26/23 2149 02/27/23 0425  BP: (!) 152/76 (!) 155/84  Pulse: 68 64  Resp: 18 18  Temp: 97.9 F (36.6 C) 97.6 F (36.4 C)  SpO2: 97% 96%    General: Pt is alert, awake, not in acute distress Cardiovascular: RRR, S1/S2 +, no edema Respiratory: CTA bilaterally, no wheezing, no rhonchi, no respiratory distress, no conversational dyspnea  Abdominal: Soft, NT, ND, bowel sounds +, +JP drain in place  Extremities: no edema, no cyanosis Psych: Normal mood and affect, stable judgement and insight     The results of significant diagnostics from this hospitalization (including imaging, microbiology, ancillary and laboratory) are listed below for reference.     Microbiology: Recent Results (from the past 240 hour(s))  Surgical pcr screen     Status: None   Collection Time: 02/24/23  7:46 PM   Specimen: Nasal Mucosa; Nasal Swab  Result Value Ref Range Status   MRSA, PCR NEGATIVE NEGATIVE Final   Staphylococcus aureus NEGATIVE NEGATIVE Final    Comment: (NOTE) The Xpert SA Assay (FDA approved for NASAL specimens in patients 67 years of age and older), is one component of a comprehensive surveillance program. It is not intended to diagnose infection nor to guide or monitor treatment. Performed at [facility], [address], [place], [place] [address]      Labs: BNP (last 3 results) No results for input(s): "BNP" in the last 8760 hours. Basic Metabolic Panel: Recent Labs  Lab 02/22/23 0814 02/23/23 0613 02/24/23 0550 02/25/23 0717 02/26/23 0538  NA 131* 129* 125* 128* 132*  K 3.5 3.0* 2.8* 4.3 4.5  CL 95* 96* 94* 97* 98  CO2 22 24 22 22 25   GLUCOSE 251* 190* 193* 183* 195*  BUN 12 14 20 15 12   CREATININE 0.85 1.06 1.09 0.94 0.91  CALCIUM 8.8* 8.0* 7.6* 8.2* 8.3*  MG  --  1.8 2.3 2.5* 2.5*   Liver Function Tests: Recent Labs  Lab 02/22/23 0814 02/23/23 0613 02/24/23 0550 02/25/23 0717 02/26/23 0538  AST 44* 54* 74* 102* 74*   ALT 35 41 49* 78* 69*  ALKPHOS 50 55  73 129* 134*  BILITOT 2.0* 1.9* 1.5* 2.1* 1.5*  PROT 6.5 6.1* 6.1* 6.4* 6.5  ALBUMIN 3.3* 3.0* 2.7* 2.7* 2.6*   Recent Labs  Lab 02/22/23 0814  LIPASE 20   No results for input(s): "AMMONIA" in the last 168 hours. CBC: Recent Labs  Lab 02/22/23 0814 02/23/23 0613 02/24/23 0550 02/25/23 0717 02/26/23 0538  WBC 6.6 7.8 6.9 6.6 4.5  NEUTROABS  --  6.4 5.2 4.9 3.2  HGB 14.1 12.6* 11.9* 12.1* 11.6*  HCT 41.9 38.0* 35.7* 37.6* 35.7*  MCV 90.1 91.1 90.6 91.5 91.3  PLT 73* 65* 70* 89* 104*   Cardiac Enzymes: No results for input(s): "CKTOTAL", "CKMB", "CKMBINDEX", "TROPONINI" in the last 168 hours. BNP: Invalid input(s): "POCBNP" CBG: Recent Labs  Lab 02/26/23 0707 02/26/23 1124 02/26/23 1648 02/26/23 2151 02/27/23 0738  GLUCAP 200* 154* 185* 211* 167*   D-Dimer No results for input(s): "DDIMER" in the last 72 hours. Hgb A1c No results for input(s): "HGBA1C" in the last 72 hours. Lipid Profile No results for input(s): "CHOL", "HDL", "LDLCALC", "TRIG", "CHOLHDL", "LDLDIRECT" in the last 72 hours. Thyroid function studies No results for input(s): "TSH", "T4TOTAL", "T3FREE", "THYROIDAB" in the last 72 hours.  Invalid input(s): "FREET3" Anemia work up No results for input(s): "VITAMINB12", "FOLATE", "FERRITIN", "TIBC", "IRON", "RETICCTPCT" in the last 72 hours. Urinalysis    Component Value Date/Time   COLORURINE YELLOW 03/03/2022 1214   APPEARANCEUR CLEAR 03/03/2022 1214   LABSPEC 1.009 03/03/2022 1214   PHURINE 5.0 03/03/2022 1214   GLUCOSEU NEGATIVE 03/03/2022 1214   HGBUR NEGATIVE 03/03/2022 1214   BILIRUBINUR NEGATIVE 03/03/2022 1214   KETONESUR NEGATIVE 03/03/2022 1214   PROTEINUR NEGATIVE 03/03/2022 1214   UROBILINOGEN 0.2 09/29/2014 0434   NITRITE NEGATIVE 03/03/2022 1214   LEUKOCYTESUR NEGATIVE 03/03/2022 1214   Sepsis Labs Recent Labs  Lab 02/23/23 0613 02/24/23 0550 02/25/23 0717 02/26/23 0538  WBC 7.8  6.9 6.6 4.5   Microbiology Recent Results (from the past 240 hour(s))  Surgical pcr screen     Status: None   Collection Time: 02/24/23  7:46 PM   Specimen: Nasal Mucosa; Nasal Swab  Result Value Ref Range Status   MRSA, PCR NEGATIVE NEGATIVE Final   Staphylococcus aureus NEGATIVE NEGATIVE Final    Comment: (NOTE) The Xpert SA Assay (FDA approved for NASAL specimens in patients 35 years of age and older), is one component of a comprehensive surveillance program. It is not intended to diagnose infection nor to guide or monitor treatment. Performed at [facility], [address], [place], [place] [address]      Patient was seen and examined on the day of discharge and was found to be in stable condition. Time coordinating discharge: 25 minutes including assessment and coordination of care, as well as examination of the patient.   SIGNED:  Noralee Stain, DO [facility] 02/27/2023, 9:46 AM

## 2023-02-27 NOTE — Progress Notes (Addendum)
Patient states that he may be going home with drain in place.   Teaching done and good return demonstration given by patient on how to empty drain, measure and record output, examine the color of drainage, and re-charge the JP bulb.  Supplies provided: Container for drainage and measurement. Extra split gauze and tape for insertion site dressing change if needed.  Gloves. (Dressing changed this morning by RN, as it was saturated).  Addendum at 1035: Discharge instructions reviewed with patient and spouse.

## 2023-02-27 NOTE — Progress Notes (Signed)
2 Days Post-Op   Subjective/Chief Complaint: PT doing well this AM Tol PO and min pain   Objective: Vital signs in last 24 hours: Temp:  [97.6 F (36.4 C)-98.4 F (36.9 C)] 97.6 F (36.4 C) (04/20 0425) Pulse Rate:  [64-68] 64 (04/20 0425) Resp:  [16-18] 18 (04/20 0425) BP: (134-155)/(71-84) 155/84 (04/20 0425) SpO2:  [96 %-98 %] 96 % (04/20 0425) Last BM Date : 02/24/23  Intake/Output from previous day: 04/19 0701 - 04/20 0700 In: -  Out: 1455 [Urine:1225; Drains:230] Intake/Output this shift: No intake/output data recorded.  General appearance: alert and cooperative GI: soft, non-tender; bowel sounds normal; no masses,  no organomegaly and JP SS  Lab Results:  Recent Labs    02/25/23 0717 02/26/23 0538  WBC 6.6 4.5  HGB 12.1* 11.6*  HCT 37.6* 35.7*  PLT 89* 104*   BMET Recent Labs    02/25/23 0717 02/26/23 0538  NA 128* 132*  K 4.3 4.5  CL 97* 98  CO2 22 25  GLUCOSE 183* 195*  BUN 15 12  CREATININE 0.94 0.91  CALCIUM 8.2* 8.3*   PT/INR No results for input(s): "LABPROT", "INR" in the last 72 hours. ABG No results for input(s): "PHART", "HCO3" in the last 72 hours.  Invalid input(s): "PCO2", "PO2"  Studies/Results: DG C-Arm 1-60 Min-No Report  Result Date: 02/25/2023 Fluoroscopy was utilized by the requesting physician.  No radiographic interpretation.    Anti-infectives: Anti-infectives (From admission, onward)    Start     Dose/Rate Route Frequency Ordered Stop   02/21/23 1300  piperacillin-tazobactam (ZOSYN) IVPB 3.375 g        3.375 g 12.5 mL/hr over 240 Minutes Intravenous Every 8 hours 02/21/23 1141         Assessment/Plan: POD 2 s/p laparoscopic cholecystectomy for acute gangrenous cholecystitis by Dr. Carolynne Edouard on 02/25/2023 - OK for home today - Continue abx, 5 days postop - Continue JP drain, currently SS - Advance diet - Mobilize, PT already following.  - Pulm toilet - OK to con't plavix  -f/u as per chart for drain eval and  possible removal.   FEN - Adv to [facility] diet. IVF per [facility] VTE - SCDs, Lovenox ID - Zosyn Foley - None    - Per [facility] -  CAD s/p CABG x 2 on DAPT  HTN HLD GERD/remote hx Gastric Ulcer - PPI Hypothyroidism Thrombocytopenia - Hx of similar on prior labs. He has never seen a hematologist.   LOS: 6 days    Axel Filler 02/27/2023

## 2023-06-03 ENCOUNTER — Telehealth: Payer: Self-pay

## 2023-06-03 NOTE — Telephone Encounter (Signed)
   Name: Miguel Vasquez  DOB: [birth year]  MRN: [id number]  Primary Cardiologist: Peter Swaziland, MD  Chart reviewed as part of pre-operative protocol coverage. Because of Ester Lapenna's past medical history and time since last visit, he will require a follow-up in-office visit in order to better assess preoperative cardiovascular risk. Recently hospitalized in April of 2024.   Pre-op covering staff: - Please schedule appointment and call patient to inform them. If patient already had an upcoming appointment within acceptable timeframe, please add "pre-op clearance" to the appointment notes so provider is aware. - Please contact requesting surgeon's office via preferred method (i.e, phone, fax) to inform them of need for appointment prior to surgery.  Per office protocol, if patient is without any new symptoms or concerns at the time of their virtual visit, he may hold Plavix  for 5 days and ASA for 5-7 days,  days prior to procedure. Please resume Plavix and ASA as soon as possible postprocedure, at the discretion of the surgeon.    Joni Reining, NP  06/03/2023, 11:24 AM

## 2023-06-03 NOTE — Telephone Encounter (Signed)
Patient has now been scheduled for an IN OFFICE pre-op clearance appt. Pt agreed on time, date and location.

## 2023-06-03 NOTE — Telephone Encounter (Signed)
   Pre-operative Risk Assessment    Patient Name: Miguel Vasquez  DOB: May 05, 1946 MRN: [id number]      Request for Surgical Clearance    Procedure:   Colonoscopy  Date of Surgery:  Clearance TBD                                 Surgeon:  Dr. Kingsley Callander Surgeon's Group or Practice Name:  [facility] Phone number:  [phone] Fax number:  [phone]   Type of Clearance Requested:   - Medical  - Pharmacy:  Hold Aspirin and Clopidogrel (Plavix)     Type of Anesthesia:  Not Indicated   Additional requests/questions:    Garrel Ridgel   06/03/2023, 11:02 AM

## 2023-06-10 NOTE — Progress Notes (Signed)
Cardiology Clinic Note   Patient Name: Miguel Vasquez Date of Encounter: 06/14/2023  Primary Care Provider:  Benjaman Pott, MD Primary Cardiologist:  Miguel Swaziland, MD  Patient Profile    77 year old male with history of hypertension, hyperlipidemia, type 2 diabetes, coronary artery disease with CABG in 2015, redo CABG on 02/26/2022 in the setting of occlusion of prior bypass grafts including LIMA,.  Last seen in the office by Miguel Boozer NP, for preop pre-op evaluation for colonoscopy, and need to hold aspirin and clopidogrel prior to procedure.  Past Medical History    Past Medical History:  Diagnosis Date   Aortic insufficiency    a. Mild AI by echo 04/2016.   Basal cell carcinoma, face    Coronary disease    a. stent to Cx 1996. b. stent to RCA 2002. c. s/p CABGx3 in 2015. d.  rotational atherectomy and DESx2 to ostial to mid RCA.   Elevated liver enzymes    GERD (gastroesophageal reflux disease)    H. pylori infection tx'd in the 1970s   Hemochromatosis    History of gout 1990s X 1   Hypercholesterolemia    Hypertension    Controlled   Hypothyroid    Myocardial infarction [facility]) 2002   "mild"   Obesity    Panic disorder    Hx of panic disorder   S/P primary angioplasty with coronary stent and rotational atherectomy of ostial mRCA with DES X 2-- 7/11/7 05/20/2016   Sinus bradycardia    a. h/o marked sinus bradycardia, BB stopped.   Tachycardia    a. Noted on stress test strips in 2017. ? Atrial flutter. Further w/u underway.   Thrombocytopenia (HCC)    a. per review of labs.   Type II diabetes mellitus [facility])    Past Surgical History:  Procedure Laterality Date   BASAL CELL CARCINOMA EXCISION     "face & lips"   CARDIAC CATHETERIZATION  11//2015   CARDIAC CATHETERIZATION N/A 05/11/2016   Procedure: Left Heart Cath and Cors/Grafts Angiography;  Surgeon: Miguel Crafts, MD;  Location: [facility] INVASIVE CV LAB;  Service: Cardiovascular;  Laterality: N/A;   CARDIAC  CATHETERIZATION N/A 05/19/2016   Procedure: Coronary/Graft Atherectomy;  Surgeon: Miguel M Swaziland, MD;  Location: [facility] INVASIVE CV LAB;  Service: Cardiovascular;  Laterality: N/A;   CARDIAC CATHETERIZATION N/A 05/19/2016   Procedure: Coronary Stent Intervention;  Surgeon: Miguel M Swaziland, MD;  Location: [facility] INVASIVE CV LAB;  Service: Cardiovascular;  Laterality: N/A;   CATARACT EXTRACTION W/ INTRAOCULAR LENS  IMPLANT, BILATERAL Bilateral 2016   CHOLECYSTECTOMY N/A 02/25/2023   Procedure: LAPAROSCOPIC CHOLECYSTECTOMY;  Surgeon: Miguel Miner, MD;  Location: [facility] ORS;  Service: General;  Laterality: N/A;   CORONARY ANGIOPLASTY WITH STENT PLACEMENT  1996; 2002; 05/19/2016   "1 + 2 +2"   CORONARY ARTERY BYPASS GRAFT N/A 10/03/2014   Procedure: CORONARY ARTERY BYPASS GRAFTING (CABG) x  three, using left internal mammary artery and right leg greater saphenous vein harvested endoscopically;  Surgeon: Miguel Slot, MD;  Location: [facility] OR;  Service: Open Heart Surgery;  Laterality: N/A;   CORONARY ARTERY BYPASS GRAFT N/A 03/05/2022   Procedure: REDO CORONARY ARTERY BYPASS GRAFTING (CABG) X 3 WITH HARVESTED LEFT GREATER SAPHENOUS VEIN AND RIGHT RADIAL ARTERY;  Surgeon: Miguel Slot, MD;  Location: [facility] OR;  Service: Open Heart Surgery;  Laterality: N/A;   ENDOVEIN HARVEST OF GREATER SAPHENOUS VEIN Left 03/05/2022   Procedure: ENDOVEIN HARVEST OF GREATER SAPHENOUS VEIN;  Surgeon: Miguel Fetch,  Salvatore Decent, MD;  Location[facility] OR;  Service: Open Heart Surgery;  Laterality: Left;   LEFT HEART CATH AND CORS/GRAFTS ANGIOGRAPHY N/A 02/06/2022   Procedure: LEFT HEART CATH AND CORS/GRAFTS ANGIOGRAPHY;  Surgeon: Vasquez, Miguel M, MD;  Location: [facility] INVASIVE CV LAB;  Service: Cardiovascular;  Laterality: N/A;   LEFT HEART CATHETERIZATION WITH CORONARY ANGIOGRAM N/A 09/28/2014   Procedure: LEFT HEART CATHETERIZATION WITH CORONARY ANGIOGRAM;  Surgeon: Miguel Bihari, MD;  Location: [facility] CATH LAB;  Service: Cardiovascular;   Laterality: N/A;   RADIAL ARTERY HARVEST Right 03/05/2022   Procedure: RADIAL ARTERY HARVEST;  Surgeon: Miguel Slot, MD;  Location[facility] OR;  Service: Open Heart Surgery;  Laterality: Right;   TEE WITHOUT CARDIOVERSION N/A 10/03/2014   Procedure: TRANSESOPHAGEAL ECHOCARDIOGRAM (TEE);  Surgeon: Miguel Slot, MD;  Location: [facility] OR;  Service: Open Heart Surgery;  Laterality: N/A;   TEE WITHOUT CARDIOVERSION N/A 03/05/2022   Procedure: TRANSESOPHAGEAL ECHOCARDIOGRAM (TEE);  Surgeon: Miguel Slot, MD;  Location: [facility] OR;  Service: Open Heart Surgery;  Laterality: N/A;   TONSILLECTOMY  1950s   TRIGGER FINGER RELEASE Bilateral 1990s-2000s    Allergies  Allergies  Allergen Reactions   Statins Other (See Comments)    "Affects liver enzymes"    History of Present Illness    [name] comes today for preoperative evaluation to have colonoscopy to [facility], with Dr. Kingsley Vasquez on date to be determined.  [name] was recently admitted to the hospital between 02/21/2023 and 02/27/2023, in the setting of acute cholecystitis, sepsis.  He was found to have gangrenous cholecystitis.  Patient also had transaminitis as a result of gallbladder disease.  He was placed on Augmentin for 5 days.  Aspirin Zetia Lopressor and Plavix were resumed on discharge on 02/22/2023.  [name] continues to walk a mile 3 times a week, is not as active playing golf due to chronic back pain.  He denies any chest pain dyspnea on exertion or fatigue which is out of proportion to normal activities.  He states that he is medically compliant, and does not have any issues with medications causing any side effects.   Home Medications    Current Outpatient Medications  Medication Sig Dispense Refill   acetaminophen (TYLENOL) 325 MG tablet Take 2 tablets (650 mg total) by mouth every 6 (six) hours. (Patient taking differently: Take 650 mg by mouth as needed.)     allopurinol (ZYLOPRIM)  300 MG tablet Take 150 mg by mouth in the morning.     aspirin 81 MG EC tablet Take 81 mg by mouth every evening.     Cholecalciferol (VITAMIN D3) 25 MCG (1000 UT) CAPS Take 1,000 Units by mouth every evening.     clopidogrel (PLAVIX) 75 MG tablet Take 1 tablet (75 mg total) by mouth daily. 30 tablet 8   ezetimibe (ZETIA) 10 MG tablet Take 10 mg by mouth daily.       folic acid (FOLVITE) 400 MCG tablet Take 400 mcg by mouth in the morning.     glipiZIDE (GLUCOTROL XL) 5 MG 24 hr tablet Take 5 mg by mouth daily with breakfast.     latanoprost (XALATAN) 0.005 % ophthalmic solution Place 1 drop into both eyes at bedtime.     levothyroxine (SYNTHROID, LEVOTHROID) 100 MCG tablet Take 100 mcg by mouth daily.       metFORMIN (GLUCOPHAGE) 500 MG tablet Take 2 tablets (1,000 mg total) by mouth in the morning  and at bedtime. Take 2 tablets by mouth 2 times daily with meal.     metoprolol tartrate (LOPRESSOR) 25 MG tablet Take 1 tablet (25 mg total) by mouth 2 (two) times daily. 180 tablet 3   nitroGLYCERIN (NITROSTAT) 0.4 MG SL tablet Place 0.4 mg under the tongue every 5 (five) minutes as needed for chest pain. DISSOLVE ONE TABLET UNDER THE TONGUE EVERY 5 MINUTES AS NEEDED FOR CHEST PAIN.  DO NOT EXCEED A TOTAL OF 3 DOSES IN 15 MINUTES     omega-3 acid ethyl esters (LOVAZA) 1 g capsule Take 2 g by mouth 2 (two) times daily.     pantoprazole (PROTONIX) 40 MG tablet Take 1 tablet (40 mg total) by mouth daily. (Patient taking differently: Take 40 mg by mouth every evening.) 30 tablet 11   ramipril (ALTACE) 10 MG capsule Take 20 mg by mouth in the morning.     rosuvastatin (CRESTOR) 10 MG tablet Take 10 mg by mouth in the morning.     No current facility-administered medications for this visit.     Family History    Family History  Problem Relation Age of Onset   Heart attack Sister    Heart failure Mother    Colon cancer Mother    Colon cancer Brother    GI problems Father    He indicated that his  mother is deceased. He indicated that his father is deceased. He indicated that his sister is alive. He indicated that his brother is deceased.  Social History    Social History   Socioeconomic History   Marital status: Married    Spouse name: Not on file   Number of children: 1   Years of education: Not on file   Highest education level: Not on file  Occupational History   Occupation: Midwife [employer]  Tobacco Use   Smoking status: Former    Current packs/day: 0.00    Average packs/day: 1 pack/day for 20.0 years (20.0 ttl pk-yrs)    Types: Cigarettes    Start date: 09/20/1995    Quit date: 09/24/1995    Years since quitting: 27.7   Smokeless tobacco: Never  Vaping Use   Vaping status: Never Used  Substance and Sexual Activity   Alcohol use: Yes    Alcohol/week: 6.0 standard drinks of alcohol    Types: 6 Cans of beer per week   Drug use: No   Sexual activity: Yes  Other Topics Concern   Not on file  Social History Narrative   Not on file   Social Determinants of Health   Financial Resource Strain: Low Risk  (12/30/2022)   Received from [place] [facility] [place], [facility] [employer]   Overall [employer] (CARDIA)    Difficulty of Paying Living Expenses: Not hard at all  Food Insecurity: No Food Insecurity (02/21/2023)   Hunger Vital Sign    Worried About Running Out of Food in the Last Year: Never true    Ran Out of Food in the Last Year: Never true  Transportation Needs: No Transportation Needs (02/21/2023)   PRAPARE - Administrator, Civil Service (Medical): No    Lack of Transportation (Non-Medical): No  Physical Activity: Insufficiently Active (12/30/2022)   Received from [place] [facility] [place], [facility] [place]   Exercise Vital Sign    Days of Exercise per Week: 1 day    Minutes of Exercise per Session: 40 min  Stress: No Stress Concern Present (  12/30/2022)   Received from [place] [facility] the [place],  [facility] the [employer] of Occupational Health - Occupational Stress Questionnaire    Feeling of Stress : Only a little  Social Connections: Socially Isolated (12/30/2022)   Received from [place] [facility] the [place], [facility] the [employer] and Isolation Panel [NHANES]    Frequency of Communication with Friends and Family: Once a week    Frequency of Social Gatherings with Friends and Family: Once a week    Attends Religious Services: Never    Database administrator or Organizations: No    Attends Banker Meetings: Never    Marital Status: Married  Catering manager Violence: Not At Risk (02/21/2023)   Humiliation, Afraid, Rape, and Kick questionnaire    Fear of Current or Ex-Partner: No    Emotionally Abused: No    Physically Abused: No    Sexually Abused: No     Review of Systems    General:  No chills, fever, night sweats or weight changes.  Complains of chronic back pain. Cardiovascular:  No chest pain, dyspnea on exertion, edema, orthopnea, palpitations, paroxysmal nocturnal dyspnea. Dermatological: No rash, lesions/masses Respiratory: No cough, dyspnea Urologic: No hematuria, dysuria Abdominal:   No nausea, vomiting, diarrhea, bright red blood per rectum, melena, or hematemesis Neurologic:  No visual changes, wkns, changes in mental status. All other systems reviewed and are otherwise negative except as noted above.  EKG Interpretation Date/Time:  Monday June 14 2023 08:26:42 EDT Ventricular Rate:  55 PR Interval:  170 QRS Duration:  86 QT Interval:  454 QTC Calculation: 434 R Axis:   -2  Text Interpretation: Sinus bradycardia Minimal voltage criteria for LVH, may be normal variant ( R in aVL ) Inferior infarct (cited on or before 14-Jun-2023) When compared with ECG of 22-Feb-2023 12:37, Vent. rate has decreased BY  49 BPM Questionable change in initial forces of Inferior leads T wave inversion no longer  evident in Anterior leads Confirmed by Joni Reining [phone]) on 06/14/2023 9:01:18 AM    Physical Exam    VS:  BP 138/82   Pulse (!) 55   Ht 6\' 1"  (1.854 m)   Wt 214 lb 9.6 oz (97.3 kg)   SpO2 99%   BMI 28.31 kg/m  , BMI Body mass index is 28.31 kg/m.     GEN: Well nourished, well developed, in no acute distress. HEENT: normal. Neck: Supple, no JVD, carotid bruits, or masses. Cardiac: RRR, no murmurs, rubs, or gallops. No clubbing, cyanosis, edema.  Radials/DP/PT 2+ and equal bilaterally.  Respiratory:  Respirations regular and unlabored, clear to auscultation bilaterally. GI: Soft, nontender, nondistended, BS + x 4. MS: no deformity or atrophy. Skin: warm and dry, no rash. Neuro:  Strength and sensation are intact. Psych: Normal affect.  EKG Interpretation Date/Time:  Monday June 14 2023 08:26:42 EDT Ventricular Rate:  55 PR Interval:  170 QRS Duration:  86 QT Interval:  454 QTC Calculation: 434 R Axis:   -2  Text Interpretation: Sinus bradycardia Minimal voltage criteria for LVH, may be normal variant ( R in aVL ) Inferior infarct (cited on or before 14-Jun-2023) When compared with ECG of 22-Feb-2023 12:37, Vent. rate has decreased BY  49 BPM Questionable change in initial forces of Inferior leads T wave inversion no longer evident in Anterior leads Confirmed by Joni Reining [phone]) on 06/14/2023 9:01:18 AM   Lab Results  Component Value Date   WBC 4.5  02/26/2023   HGB 11.6 (L) 02/26/2023   HCT 35.7 (L) 02/26/2023   MCV 91.3 02/26/2023   PLT 104 (L) 02/26/2023   Lab Results  Component Value Date   CREATININE 0.91 02/26/2023   BUN 12 02/26/2023   NA 132 (L) 02/26/2023   K 4.5 02/26/2023   CL 98 02/26/2023   CO2 25 02/26/2023   Lab Results  Component Value Date   ALT 69 (H) 02/26/2023   AST 74 (H) 02/26/2023   ALKPHOS 134 (H) 02/26/2023   BILITOT 1.5 (H) 02/26/2023   Lab Results  Component Value Date   CHOL 130 02/04/2022   HDL 53 02/04/2022    LDLCALC 52 02/04/2022   TRIG 146 02/04/2022   CHOLHDL 2.5 02/04/2022    Lab Results  Component Value Date   HGBA1C 8.6 (H) 02/21/2023     Review of Prior Studies EKG Interpretation Date/Time:  Monday June 14 2023 08:26:42 EDT Ventricular Rate:  55 PR Interval:  170 QRS Duration:  86 QT Interval:  454 QTC Calculation: 434 R Axis:   -2  Text Interpretation: Sinus bradycardia Minimal voltage criteria for LVH, may be normal variant ( R in aVL ) Inferior infarct (cited on or before 14-Jun-2023) When compared with ECG of 22-Feb-2023 12:37, Vent. rate has decreased BY  49 BPM Questionable change in initial forces of Inferior leads T wave inversion no longer evident in Anterior leads Confirmed by Joni Reining [phone]) on 06/14/2023 9:01:18 AM Cardiac cath 02/06/22:  LEFT HEART CATH AND CORS/GRAFTS ANGIOGRAPHY    Conclusion       Mid LAD lesion is 90% stenosed.   Prox Cx to Mid Cx lesion is 90% stenosed.   1st Mrg lesion is 90% stenosed.   Dist RCA lesion is 99% stenosed.   Ost RCA to Mid RCA lesion is 20% stenosed.   Origin lesion is 100% stenosed.   Origin to Prox Graft lesion is 100% stenosed.   Origin to Prox Graft lesion is 100% stenosed.   The left ventricular systolic function is normal.   LV end diastolic pressure is mildly elevated.   The left ventricular ejection fraction is 55-65% by visual estimate.   Severe 3 vessel obstructive CAD. There is significant progression of disease in the distal RCA and LCx at OM1 and OM2 bifurcation.  LIMA is now occluded. Known occlusion of SVGs Stents in proximal RCA are patent Good LV function Mildly elevated LVEDP   Plan; significant progression of disease since 2017 and now occluded LIMA to the LAD. Recommend CT surgery consult to consider whether he is a candidate for redo bypass. If not would at least do atherectomy and stenting of the LAD. Further PCI of the distal RCA would be very difficult since the vessel is hard to engage  with prior stent at the ostium. The LCx has bifurcation disease.     Diagnostic Dominance: Right  Intervention   Echo 02/06/22: IMPRESSIONS     1. Left ventricular ejection fraction, by estimation, is 55 to 60%. The  left ventricle has normal function. The left ventricle has no regional  wall motion abnormalities. There is mild concentric left ventricular  hypertrophy. Left ventricular diastolic  parameters are indeterminate.   2. Right ventricular systolic function is normal. The right ventricular  size is normal. Tricuspid regurgitation signal is inadequate for assessing  PA pressure.   3. The mitral valve is normal in structure. No evidence of mitral valve  regurgitation. No evidence of mitral stenosis.  4. The aortic valve is grossly normal. There is mild calcification of the  aortic valve. There is mild thickening of the aortic valve. Aortic valve  regurgitation is mild. No aortic stenosis is present.   5. The inferior vena cava is normal in size with greater than 50%  respiratory variability, suggesting right atrial pressure of 3 mmHg.   6. Cannot exclude a small PFO.    Assessment & Plan   1.  Pre-Op Cardiac Evaluation:  Pt is 6.6% risk of major cardiac event  a class 3 risk. He is stable. Meets 4 METS.  According to the Revised Cardiac Risk Index (RCRI), his 0.4  His METSs  according to the [facility] Activity Status Index (DASI). 8.23  He may hold Eliquis and ASA for 5 days prior to colonoscopy.   Please resume Eliquis and ASA  as soon as possible postprocedure, at the discretion of the surgeon.    Therefore, based on ACC/AHA guidelines, patient would be at acceptable risk for the planned procedure without further cardiovascular testing. I will route this recommendation to the requesting party via Epic fax function.       2.  Coronary artery disease: Status postcoronary artery bypass grafting.  He is doing well from a cardiac standpoint and continues to be active.  He is  still playing golf due to some back pain but otherwise works in his yard and walks a mile 3 times a week.  He denies any chest pain or any angina symptoms such as shortness of breath or extreme fatigue.  Would continue his current medication regimen as directed with exception of Plavix and aspirin perioperatively.  3.  Hypercholesterolemia: He is to continue rosuvastatin 10 mg daily, and Zetia as directed.  Labs are followed by primary care provider.  4.  Type 2 diabetes: Followed by PCP.   Signed, Bettey Mare. Liborio Nixon, ANP, AACC   06/14/2023 9:27 AM      Office [phone] Fax [phone]  Notice: This dictation was prepared with Dragon dictation along with smaller phrase technology. Any transcriptional errors that result from this process are unintentional and may not be corrected upon review.

## 2023-06-14 ENCOUNTER — Ambulatory Visit: Payer: Medicare PPO | Attending: Adult Health | Admitting: Adult Health

## 2023-06-14 ENCOUNTER — Encounter: Payer: Self-pay | Admitting: Adult Health

## 2023-06-14 VITALS — BP 138/82 | HR 55 | Ht 73.0 in | Wt 214.6 lb

## 2023-06-14 DIAGNOSIS — Z01818 Encounter for other preprocedural examination: Secondary | ICD-10-CM

## 2023-06-14 DIAGNOSIS — I10 Essential (primary) hypertension: Secondary | ICD-10-CM

## 2023-06-14 DIAGNOSIS — E7800 Pure hypercholesterolemia, unspecified: Secondary | ICD-10-CM | POA: Diagnosis not present

## 2023-06-14 DIAGNOSIS — I2510 Atherosclerotic heart disease of native coronary artery without angina pectoris: Secondary | ICD-10-CM | POA: Diagnosis not present

## 2023-06-14 DIAGNOSIS — Z951 Presence of aortocoronary bypass graft: Secondary | ICD-10-CM

## 2023-06-14 NOTE — Patient Instructions (Signed)
Medication Instructions:  No Changes *If you need a refill on your cardiac medications before your next appointment, please call your pharmacy*   Lab Work: No Labs If you have labs (blood work) drawn today and your tests are completely normal, you will receive your results only by: MyChart Message (if you have MyChart) OR A paper copy in the mail If you have any lab test that is abnormal or we need to change your treatment, we will call you to review the results.   Testing/Procedures: No Testing   Follow-Up: At [facility] you and your health needs are our priority.  As part of our continuing mission to provide you with exceptional heart care, we have created designated Provider Care Teams.  These Care Teams include your primary Cardiologist (physician) and Advanced Practice Providers (APPs -  Physician Assistants and Nurse Practitioners) who all work together to provide you with the care you need, when you need it.  We recommend signing up for the patient portal called "MyChart".  Sign up information is provided on this After Visit Summary.  MyChart is used to connect with patients for Virtual Visits (Telemedicine).  Patients are able to view lab/test results, encounter notes, upcoming appointments, etc.  Non-urgent messages can be sent to your provider as well.   To learn more about what you can do with MyChart, go to [email or web address].    Your next appointment:   Keep Scheduled Appointment  Provider:   Peter Swaziland, MD    Other Instructions Hold Plavix and Aspirin 5 Days Prior to Procedure.

## 2023-06-16 ENCOUNTER — Telehealth: Payer: Self-pay | Admitting: Cardiology

## 2023-06-16 NOTE — Telephone Encounter (Signed)
Office calling to f/u on Clearance that was sent on 7/25. Please advise

## 2023-06-16 NOTE — Telephone Encounter (Signed)
Clearance faxed to requesting office. Spoke with Tresa Endo from requesting office and she states that clearance has been received

## 2023-06-24 NOTE — Telephone Encounter (Signed)
     Primary Cardiologist: Peter Swaziland, MD  Chart reviewed as part of pre-operative protocol coverage. Given past medical history and time since last visit, based on ACC/AHA guidelines, Norville Nott would be at acceptable risk for the planned procedure without further cardiovascular testing.   Pt is 6.6% risk of major cardiac event a class 3 risk.  He is able to complete 4 METS of physical activity.  His Plavix may be held for 5 days prior to his procedure.  His aspirin may be held for 5 to 7 days prior to his procedure.  Please resume as soon as hemostasis is achieved.  I will route this recommendation to the requesting party via Epic fax function and remove from pre-op pool.  Please call with questions.  Thomasene Ripple. Fynley Chrystal NP-C     06/24/2023, 9:10 AM [facility] [address] Office [phone] Fax [phone]

## 2023-06-24 NOTE — Telephone Encounter (Signed)
I received a call from [place], [employer] Supervisor I regard to the pt's clearance notes from Joni Reining, DNP. Clearance notes from DNP provide clearance and ok to hold ASA and Eliquis x 5 days prior to colonoscopy.   Per [place], RN pt is on ASA and Plavix and not Eliquis. Notes will need to be amended to reflect correct medications ok to be held (ASA and Plavix).   I assured [place], RN that I will address with Joni Reining, DNP to amend her notes. Will send to pre op APP as well as FYI.   Per [place], RN if I can call her once this have been taken care.

## 2023-07-12 NOTE — Progress Notes (Signed)
Cardiology Office Note    Date:  07/19/2023  ID:  Miguel Vasquez, DOB 1946-08-20, MRN [id number] PCP:  Miguel Pott, MD  Cardiologist:  Miguel Facer [place] MD  Chief Complaint: f/u CAD  History of Present Illness:  Miguel Vasquez is a 77 y.o. male seen for evaluation of DOE and some chest pain. He has a history of CAD (stent to Cx 1996, stent to RCA 2002, s/p CABGx3 in 2015, rotational atherectomy/DESx2 to RCA 05/2017), HLD (intolerant of statins due to elevated liver/muscle enzymes), HTN, DM, hemochromatosis, cervical disc disease, panic disorder, hypothyroidism, h/o marked sinus bradycardia (BB stopped)   Last 2D Echo 09/2014: mild-mod LVH, EF 55-60%, cannot exclude RWMA, mild AI. This past summer he was complaining of atypical chest pain but this was similar to what prompted CABG. Nuc was abnormal so he underwent diagnostic cath 05/11/16 showing severe, diffuse calcific disease and a very large dominant RCA - essentially, chronic occlusion at the ostium. There were brisk left to right collaterals. He had occluded SVG-OM. 50% mLAD, patent LIMA-LAD. LVEF and LVEDP were normal. He was brought back for staged PCI with successful rotational atherectomy and stenting of the ostial to mid RCA with DES x 2. DAPT was recommended indefinitely.  We had also checked an echo 05/07/16 for systolic murmur showing mod LVH, EF 60-65%, grade 1 DD, mild AI.   He did undergo cardiac cath  on 02/06/22. This demonstrated occlusion of prior bypass grafts including LIMA. Severe 3 vessel disease. EF is preserved. He was seen by Dr Miguel Vasquez on 02/19/22 for consideration of redo CABG. This was performed on 03/05/22 with right radial graft to the LAD, SVG to OM and SVG to PL branches. His post op course was uncomplicated and he was DC.   Miguel Vasquez was admitted to the hospital between 02/21/2023 and 02/27/2023, in the setting of acute cholecystitis, sepsis. He was found to have gangrenous cholecystitis. Patient also had transaminitis as a  result of gallbladder disease. He was placed on Augmentin for 5 days. Aspirin Zetia Lopressor and Plavix were resumed on discharge on 02/22/2023.    On follow up today he is doing well. He is scheduled for colonoscopy next month. Is seeing primary care with labs in the next month. No chest pain or dyspnea.he is still limited in activity due to back pain.   Past Medical History:  Diagnosis Date   Aortic insufficiency    a. Mild AI by echo 04/2016.   Basal cell carcinoma, face    Coronary disease    a. stent to Cx 1996. b. stent to RCA 2002. c. s/p CABGx3 in 2015. d.  rotational atherectomy and DESx2 to ostial to mid RCA.   Elevated liver enzymes    GERD (gastroesophageal reflux disease)    H. pylori infection tx'd in the 1970s   Hemochromatosis    History of gout 1990s X 1   Hypercholesterolemia    Hypertension    Controlled   Hypothyroid    Myocardial infarction [place]) 2002   "mild"   Obesity    Panic disorder    Hx of panic disorder   S/P primary angioplasty with coronary stent and rotational atherectomy of ostial mRCA with DES X 2-- 7/11/7 05/20/2016   Sinus bradycardia    a. h/o marked sinus bradycardia, BB stopped.   Tachycardia    a. Noted on stress test strips in 2017. ? Atrial flutter. Further w/u underway.   Thrombocytopenia (HCC)    a. per review of  labs.   Type II diabetes mellitus [facility])     Past Surgical History:  Procedure Laterality Date   BASAL CELL CARCINOMA EXCISION     "face & lips"   CARDIAC CATHETERIZATION  11//2015   CARDIAC CATHETERIZATION N/A 05/11/2016   Procedure: Left Heart Cath and Cors/Grafts Angiography;  Surgeon: Corky Crafts, MD;  Location: [facility] INVASIVE CV LAB;  Service: Cardiovascular;  Laterality: N/A;   CARDIAC CATHETERIZATION N/A 05/19/2016   Procedure: Coronary/Graft Atherectomy;  Surgeon: Beatric Fulop M Sw[place], MD;  Location: [facility] INVASIVE CV LAB;  Service: Cardiovascular;  Laterality: N/A;   CARDIAC CATHETERIZATION N/A 05/19/2016   Procedure:  Coronary Stent Intervention;  Surgeon: Willamina Grieshop M Sw[place], MD;  Location: [facility] INVASIVE CV LAB;  Service: Cardiovascular;  Laterality: N/A;   CATARACT EXTRACTION W/ INTRAOCULAR LENS  IMPLANT, BILATERAL Bilateral 2016   CHOLECYSTECTOMY N/A 02/25/2023   Procedure: LAPAROSCOPIC CHOLECYSTECTOMY;  Surgeon: Griselda Miner, MD;  Location: [facility] ORS;  Service: General;  Laterality: N/A;   CORONARY ANGIOPLASTY WITH STENT PLACEMENT  1996; 2002; 05/19/2016   "1 + 2 +2"   CORONARY ARTERY BYPASS GRAFT N/A 10/03/2014   Procedure: CORONARY ARTERY BYPASS GRAFTING (CABG) x  three, using left internal mammary artery and right leg greater saphenous vein harvested endoscopically;  Surgeon: Loreli Slot, MD;  Location: [facility] OR;  Service: Open Heart Surgery;  Laterality: N/A;   CORONARY ARTERY BYPASS GRAFT N/A 03/05/2022   Procedure: REDO CORONARY ARTERY BYPASS GRAFTING (CABG) X 3 WITH HARVESTED LEFT GREATER SAPHENOUS VEIN AND RIGHT RADIAL ARTERY;  Surgeon: Loreli Slot, MD;  Location: [facility] OR;  Service: Open Heart Surgery;  Laterality: N/A;   ENDOVEIN HARVEST OF GREATER SAPHENOUS VEIN Left 03/05/2022   Procedure: ENDOVEIN HARVEST OF GREATER SAPHENOUS VEIN;  Surgeon: Loreli Slot, MD;  [facility];  Service: Open Heart Surgery;  Laterality: Left;   LEFT HEART CATH AND CORS/GRAFTS ANGIOGRAPHY N/A 02/06/2022   Procedure: LEFT HEART CATH AND CORS/GRAFTS ANGIOGRAPHY;  Surgeon: Vasquez, Liylah Najarro M, MD;  [facility] INVAS[facility] CV LAB;  Service: Cardiovascular;  Laterality: N/A;   LEFT HEART CATHETERIZATION WITH CORONARY ANGIOGRAM N/A 09/28/2014   Procedure: LEFT HEART CATHETERIZATION WITH CORONARY ANGIOGRAM;  Surgeon: Lennette Bihari, MD;  Location[facility] CATH LAB;  Service: Cardiovascular;  Laterality: N/A;   RADIAL ARTERY HARVEST Right 03/05/2022   Procedure: RADIAL ARTERY HARVEST;  Surgeon: Loreli Slot, MD;  Location: [facility] OR;  Service: Open Heart Surgery;  Laterality: Right;   TEE WITHOUT CARDIOVERSION N/A  10/03/2014   Procedure: TRANSESOPHAGEAL ECHOCARDIOGRAM (TEE);  Surgeon: Loreli Slot, MD;  Location: [facility] OR;  Service: Open Heart Surgery;  Laterality: N/A;   TEE WITHOUT CARDIOVERSION N/A 03/05/2022   Procedure: TRANSESOPHAGEAL ECHOCARDIOGRAM (TEE);  Surgeon: Loreli Slot, MD;  Location: [facility] OR;  Service: Open Heart Surgery;  Laterality: N/A;   TONSILLECTOMY  1950s   TRIGGER FINGER RELEASE Bilateral 1990s-2000s    Current Medications: Current Outpatient Medications  Medication Sig Dispense Refill   acetaminophen (TYLENOL) 325 MG tablet Take 2 tablets (650 mg total) by mouth every 6 (six) hours. (Patient taking differently: Take 650 mg by mouth as needed.)     allopurinol (ZYLOPRIM) 300 MG tablet Take 150 mg by mouth in the morning.     aspirin 81 MG EC tablet Take 81 mg by mouth every evening.     Cholecalciferol (VITAMIN D3) 25 MCG (1000 UT) CAPS Take 1,000 Units by mouth every evening.     ezetimibe (ZETIA) 10  MG tablet Take 10 mg by mouth daily.       folic acid (FOLVITE) 400 MCG tablet Take 400 mcg by mouth in the morning.     glipiZIDE (GLUCOTROL XL) 5 MG 24 hr tablet Take 5 mg by mouth daily with breakfast.     latanoprost (XALATAN) 0.005 % ophthalmic solution Place 1 drop into both eyes at bedtime.     levothyroxine (SYNTHROID, LEVOTHROID) 100 MCG tablet Take 100 mcg by mouth daily.       metFORMIN (GLUCOPHAGE) 500 MG tablet Take 2 tablets (1,000 mg total) by mouth in the morning and at bedtime. Take 2 tablets by mouth 2 times daily with meal.     metoprolol tartrate (LOPRESSOR) 25 MG tablet Take 1 tablet (25 mg total) by mouth 2 (two) times daily. 180 tablet 3   nitroGLYCERIN (NITROSTAT) 0.4 MG SL tablet Place 0.4 mg under the tongue every 5 (five) minutes as needed for chest pain. DISSOLVE ONE TABLET UNDER THE TONGUE EVERY 5 MINUTES AS NEEDED FOR CHEST PAIN.  DO NOT EXCEED A TOTAL OF 3 DOSES IN 15 MINUTES     omega-3 acid ethyl esters (LOVAZA) 1 g capsule Take 2 g by  mouth 2 (two) times daily.     pantoprazole (PROTONIX) 40 MG tablet Take 1 tablet (40 mg total) by mouth daily. (Patient taking differently: Take 40 mg by mouth every evening.) 30 tablet 11   ramipril (ALTACE) 10 MG capsule Take 20 mg by mouth in the morning.     rosuvastatin (CRESTOR) 10 MG tablet Take 10 mg by mouth in the morning.     No current facility-administered medications for this visit.     Allergies:   Statins   Social History   Socioeconomic History   Marital status: Married    Spouse name: Not on file   Number of children: 1   Years of education: Not on file   Highest education level: Not on file  Occupational History   Occupation: Midwife [employer]  Tobacco Use   Smoking status: Former    Current packs/day: 0.00    Average packs/day: 1 pack/day for 20.0 years (20.0 ttl pk-yrs)    Types: Cigarettes    Start date: 09/20/1995    Quit date: 09/24/1995    Years since quitting: 27.8   Smokeless tobacco: Never  Vaping Use   Vaping status: Never Used  Substance and Sexual Activity   Alcohol use: Yes    Alcohol/week: 6.0 standard drinks of alcohol    Types: 6 Cans of beer per week   Drug use: No   Sexual activity: Yes  Other Topics Concern   Not on file  Social History Narrative   Not on file   Social Determinants of Health   Financial Resource Strain: Low Risk  (12/30/2022)   Received from [place] [facility] [place], [facility]   Overall [employer] (CARDIA)    Difficulty of Paying Living Expenses: Not hard at all  Food Insecurity: No Food Insecurity (02/21/2023)   Hunger Vital Sign    Worried About Running Out of Food in the Last Year: Never true    Ran Out of Food in the Last Year: Never true  Transportation Needs: No Transportation Needs (02/21/2023)   PRAPARE - Administrator, Civil Service (Medical): No    Lack of Transportation (Non-Medical): No  Physical Activity: Insufficiently Active (12/30/2022)   Received  from [place] [facility] [place], [facility]  [place]   Exercise Vital Sign    Days of Exercise per Week: 1 day    Minutes of Exercise per Session: 40 min  Stress: No Stress Concern Present (12/30/2022)   Received from [place] [facility] [place], [facility] the [employer] of Occupational Health - Occupational Stress Questionnaire    Feeling of Stress : Only a little  Social Connections: Socially Isolated (12/30/2022)   Received from [place] [facility] [place], [facility] the [employer] and Isolation Panel [NHANES]    Frequency of Communication with Friends and Family: Once a week    Frequency of Social Gatherings with Friends and Family: Once a week    Attends Religious Services: Never    Database administrator or Organizations: No    Attends Engineer, structural: Never    Marital Status: Married     Family History:  The patient's family history includes Colon cancer in his brother and mother; GI problems in his father; Heart attack in his sister; Heart failure in his mother.   ROS:   Please see the history of present illness.   All other systems are reviewed and otherwise negative.    PHYSICAL EXAM:   VS:  BP (!) 170/82 (BP Location: Left Arm, Patient Position: Sitting, Cuff Size: Normal)   Pulse 63   Ht 6\' 1"  (1.854 m)   Wt 215 lb 9.6 oz (97.8 kg)   SpO2 97%   BMI 28.44 kg/m   BMI: Body mass index is 28.44 kg/m. GENERAL:  Well appearing WM in NAD HEENT:  PERRL, EOMI, sclera are clear. Oropharynx is clear. NECK:  No jugular venous distention, carotid upstroke brisk and symmetric, no bruits, no thyromegaly or adenopathy LUNGS:  Clear to auscultation bilaterally CHEST:  Unremarkable HEART:  RRR,  PMI not displaced or sustained,S1 and S2 within normal limits, no S3, no S4: no clicks, no rubs, soft systolic murmur ABD:  Soft, nontender. BS +, no masses or bruits. No hepatomegaly, no splenomegaly EXT:  2 + pulses  throughout, no edema, no cyanosis no clubbing SKIN:  Warm and dry.  No rashes NEURO:  Alert and oriented x 3. Cranial nerves II through XII intact. PSYCH:  Cognitively intact   Wt Readings from Last 3 Encounters:  07/19/23 215 lb 9.6 oz (97.8 kg)  06/14/23 214 lb 9.6 oz (97.3 kg)  02/21/23 216 lb 14.9 oz (98.4 kg)      Studies/Labs Reviewed:   EKG:   EKG is not ordered today.  Recent Labs: 02/24/2023: TSH 3.578 02/26/2023: ALT 69; BUN 12; Creatinine, Ser 0.91; Hemoglobin 11.6; Magnesium 2.5; Platelets 104; Potassium 4.5; Sodium 132   Lipid Panel    Component Value Date/Time   CHOL 130 02/04/2022 1104   TRIG 146 02/04/2022 1104   HDL 53 02/04/2022 1104   CHOLHDL 2.5 02/04/2022 1104   CHOLHDL 5.0 05/07/2016 0736   VLDL 44 (H) 05/07/2016 0736   LDLCALC 52 02/04/2022 1104    Additional studies/ records that were reviewed today include:  Labs dated 02/16/17: cholesterol 266, triglycerides 253, HDL 46, LDL 169. CBC normal except platelets 129K.  Dated 02/24/17: A1c 7.3%. Iron studies and TFTs normal. AST 90, ALT 128. Other chemistries are normal.  Dated 10/12/17: A1c 6%. Hgb normal. Potassium 5.2. AST 60. TSH normal. Triglycerides 98, HDL 58, LDL 161. Dated 11/29/18: potassium 5.1. creatinine 0.8. glucose 181. AST 97, ALT 108. Cholesterol 252, triglycerides 290, HDL 41, LDL 153. plts 143K.  Otherwise CBC normal. Dated 08/14/20: cholesterol 188, triglycerides 163, HDL 46, LDL 113. AST 74, ALT 79. TSH normal. Platelets 138K. Glucose 143, A1c 7.2%. Dated 08/05/21: cholesterol 186, triglycerides 211, HDL 44, LDL 100. Plts 111K otherwise CBC normal. A1c 6.9%. sodium 135. AST and ALT 86. Glucose 170, TSH normal. Dated 07/29/22: A1c  7.1%. cholesterol 143, triglycerides 123, HDL 55, LDL 63. AST 52, ALT 45. Bili 1.6. otherwise CMET normal. TSH and CK normal.   Cardiac cath 02/06/22:  LEFT HEART CATH AND CORS/GRAFTS ANGIOGRAPHY   Conclusion      Mid LAD lesion is 90% stenosed.   Prox Cx to  Mid Cx lesion is 90% stenosed.   1st Mrg lesion is 90% stenosed.   Dist RCA lesion is 99% stenosed.   Ost RCA to Mid RCA lesion is 20% stenosed.   Origin lesion is 100% stenosed.   Origin to Prox Graft lesion is 100% stenosed.   Origin to Prox Graft lesion is 100% stenosed.   The left ventricular systolic function is normal.   LV end diastolic pressure is mildly elevated.   The left ventricular ejection fraction is 55-65% by visual estimate.   Severe 3 vessel obstructive CAD. There is significant progression of disease in the distal RCA and LCx at OM1 and OM2 bifurcation.  LIMA is now occluded. Known occlusion of SVGs Stents in proximal RCA are patent Good LV function Mildly elevated LVEDP   Plan; significant progression of disease since 2017 and now occluded LIMA to the LAD. Recommend CT surgery consult to consider whether he is a candidate for redo bypass. If not would at least do atherectomy and stenting of the LAD. Further PCI of the distal RCA would be very difficult since the vessel is hard to engage with prior stent at the ostium. The LCx has bifurcation disease.     Echo 02/06/22: IMPRESSIONS     1. Left ventricular ejection fraction, by estimation, is 55 to 60%. The  left ventricle has normal function. The left ventricle has no regional  wall motion abnormalities. There is mild concentric left ventricular  hypertrophy. Left ventricular diastolic  parameters are indeterminate.   2. Right ventricular systolic function is normal. The right ventricular  size is normal. Tricuspid regurgitation signal is inadequate for assessing  PA pressure.   3. The mitral valve is normal in structure. No evidence of mitral valve  regurgitation. No evidence of mitral stenosis.   4. The aortic valve is grossly normal. There is mild calcification of the  aortic valve. There is mild thickening of the aortic valve. Aortic valve  regurgitation is mild. No aortic stenosis is present.   5. The  inferior vena cava is normal in size with greater than 50%  respiratory variability, suggesting right atrial pressure of 3 mmHg.   6. Cannot exclude a small PFO.   Comparison(s): No prior Echocardiogram.   ASSESSMENT & PLAN:   CAD s/p CABG 2015. S/p complex rotational atherectomy and stenting of the RCA in July 2017 (failed SVG to RCA).   Cardiac cath showed occlusion of all prior bypass grafts and 3 vessel disease. EF is normal without significant valvular disease ( mild AI). Now s/p redo CABG on 03/05/22 with right radial to LAD, SVG to OM, SVG to PL. No significant angina. We discussed the importance of eating right to take care of these grafts. Continue ACEi, metoprolol, statin. OK to discontinue Plavix. Continue ASA Mild AI - no change on last Echo.  HTN -  BP elevated today. Patient taking readings at home ranging from 128-140 systolic and 67-79 diastolic Hyperlipidemia -  On Zetia. Now on Crestor 10 mg daily. Did not tolerate lipitor in the past. Follow up labs with PCP. Goal LDL < 55. If not at goal would consider adding PCSK 9 inhibitor like Repatha Thrombocytopenia - no bleeding DM type 2. Last A1c was 8.7%. Now on glipizide and metformin. Reports yeast infection with Jardiance. Needs to do better with diet. If not at goal on follow up labs consider GLP 1 agonist.      Signed, Breeze [name] Swaz[place] MD, [facility]  07/19/2023 11:12 AM    [facility]

## 2023-07-19 ENCOUNTER — Encounter: Payer: Self-pay | Admitting: Cardiology

## 2023-07-19 ENCOUNTER — Ambulatory Visit: Payer: Medicare PPO | Attending: Cardiology | Admitting: Cardiology

## 2023-07-19 VITALS — BP 170/82 | HR 63 | Ht 73.0 in | Wt 215.6 lb

## 2023-07-19 DIAGNOSIS — E7800 Pure hypercholesterolemia, unspecified: Secondary | ICD-10-CM | POA: Diagnosis not present

## 2023-07-19 DIAGNOSIS — E119 Type 2 diabetes mellitus without complications: Secondary | ICD-10-CM

## 2023-07-19 DIAGNOSIS — Z951 Presence of aortocoronary bypass graft: Secondary | ICD-10-CM | POA: Diagnosis not present

## 2023-07-19 DIAGNOSIS — I25708 Atherosclerosis of coronary artery bypass graft(s), unspecified, with other forms of angina pectoris: Secondary | ICD-10-CM | POA: Diagnosis not present

## 2023-07-19 DIAGNOSIS — I10 Essential (primary) hypertension: Secondary | ICD-10-CM | POA: Diagnosis not present

## 2023-07-19 NOTE — Patient Instructions (Signed)
Medication Instructions:  Stop Plavix Continue all other medications *If you need a refill on your cardiac medications before your next appointment, please call your pharmacy*   Lab Work: None ordered   Testing/Procedures: None ordered   Follow-Up: At [facility], you and your health needs are our priority.  As part of our continuing mission to provide you with exceptional heart care, we have created designated Provider Care Teams.  These Care Teams include your primary Cardiologist (physician) and Advanced Practice Providers (APPs -  Physician Assistants and Nurse Practitioners) who all work together to provide you with the care you need, when you need it.  We recommend signing up for the patient portal called "MyChart".  Sign up information is provided on this After Visit Summary.  MyChart is used to connect with patients for Virtual Visits (Telemedicine).  Patients are able to view lab/test results, encounter notes, upcoming appointments, etc.  Non-urgent messages can be sent to your provider as well.   To learn more about what you can do with MyChart, go to [email or web address].    Your next appointment:  6 months   Call in Jan to schedule March appointment     Provider:  Dr.Jordan

## 2024-02-04 NOTE — Progress Notes (Signed)
 Cardiology Office Note    Date:  02/08/2024  ID:  Miguel Vasquez, DOB 24-May-1946, MRN [id number] PCP:  Benjaman Pott, MD  Cardiologist:  Rockey Guarino [place] MD  Chief Complaint: f/u CAD  History of Present Illness:  Miguel Vasquez is a 77 y.o. male seen for evaluation of DOE and some chest pain. He has a history of CAD (stent to Cx 1996, stent to RCA 2002, s/p CABGx3 in 2015, rotational atherectomy/DESx2 to RCA 05/2017), HLD (intolerant of statins due to elevated liver/muscle enzymes), HTN, DM, hemochromatosis, cervical disc disease, panic disorder, hypothyroidism, h/o marked sinus bradycardia (BB stopped)   Last 2D Echo 09/2014: mild-mod LVH, EF 55-60%, cannot exclude RWMA, mild AI. This past summer he was complaining of atypical chest pain but this was similar to what prompted CABG. Nuc was abnormal so he underwent diagnostic cath 05/11/16 showing severe, diffuse calcific disease and a very large dominant RCA - essentially, chronic occlusion at the ostium. There were brisk left to right collaterals. He had occluded SVG-OM. 50% mLAD, patent LIMA-LAD. LVEF and LVEDP were normal. He was brought back for staged PCI with successful rotational atherectomy and stenting of the ostial to mid RCA with DES x 2. DAPT was recommended indefinitely.  We had also checked an echo 05/07/16 for systolic murmur showing mod LVH, EF 60-65%, grade 1 DD, mild AI.   He did undergo cardiac cath  on 02/06/22. This demonstrated occlusion of prior bypass grafts including LIMA. Severe 3 vessel disease. EF is preserved. He was seen by Dr Dorris Fetch on 02/19/22 for consideration of redo CABG. This was performed on 03/05/22 with right radial graft to the LAD, SVG to OM and SVG to PL branches. His post op course was uncomplicated and he was DC.   [name] was admitted to the hospital between 02/21/2023 and 02/27/2023, in the setting of acute cholecystitis, sepsis. He was found to have gangrenous cholecystitis. Patient also had transaminitis as a  result of gallbladder disease. He was placed on Augmentin for 5 days. Aspirin Zetia Lopressor and Plavix were resumed on discharge on 02/22/2023.   On follow up today he is doing ok. He is very sedentary. Notes back is stiff. No chest pain or dyspnea. Scheduled for lab with PCP next month. BP at home typically in 120-130s.       Past Medical History:  Diagnosis Date   Aortic insufficiency    a. Mild AI by echo 04/2016.   Basal cell carcinoma, face    Coronary disease    a. stent to Cx 1996. b. stent to RCA 2002. c. s/p CABGx3 in 2015. d.  rotational atherectomy and DESx2 to ostial to mid RCA.   Elevated liver enzymes    GERD (gastroesophageal reflux disease)    H. pylori infection tx'd in the 1970s   Hemochromatosis    History of gout 1990s X 1   Hypercholesterolemia    Hypertension    Controlled   Hypothyroid    Myocardial infarction [facility]) 2002   "mild"   Obesity    Panic disorder    Hx of panic disorder   S/P primary angioplasty with coronary stent and rotational atherectomy of ostial mRCA with DES X 2-- 7/11/7 05/20/2016   Sinus bradycardia    a. h/o marked sinus bradycardia, BB stopped.   Tachycardia    a. Noted on stress test strips in 2017. ? Atrial flutter. Further w/u underway.   Thrombocytopenia (HCC)    a. per review of labs.  Type II diabetes mellitus [facility])     Past Surgical History:  Procedure Laterality Date   BASAL CELL CARCINOMA EXCISION     "face & lips"   CARDIAC CATHETERIZATION  11//2015   CARDIAC CATHETERIZATION N/A 05/11/2016   Procedure: Left Heart Cath and Cors/Grafts Angiography;  Surgeon: Corky Crafts, MD;  Location: [facility] INVASIVE CV LAB;  Service: Cardiovascular;  Laterality: N/A;   CARDIAC CATHETERIZATION N/A 05/19/2016   Procedure: Coronary/Graft Atherectomy;  Surgeon: Tyrrell Stephens M [place], MD;  Location: [facility] INVASIVE CV LAB;  Service: Cardiovascular;  Laterality: N/A;   CARDIAC CATHETERIZATION N/A 05/19/2016   Procedure: Coronary Stent Intervention;   Surgeon: Domonik Levario M [place], MD;  Location: [facility] INVASIVE CV LAB;  Service: Cardiovascular;  Laterality: N/A;   CATARACT EXTRACTION W/ INTRAOCULAR LENS  IMPLANT, BILATERAL Bilateral 2016   CHOLECYSTECTOMY N/A 02/25/2023   Procedure: LAPAROSCOPIC CHOLECYSTECTOMY;  Surgeon: Griselda Miner, MD;  Location: [facility] ORS;  Service: General;  Laterality: N/A;   CORONARY ANGIOPLASTY WITH STENT PLACEMENT  1996; 2002; 05/19/2016   "1 + 2 +2"   CORONARY ARTERY BYPASS GRAFT N/A 10/03/2014   Procedure: CORONARY ARTERY BYPASS GRAFTING (CABG) x  three, using left internal mammary artery and right leg greater saphenous vein harvested endoscopically;  Surgeon: Loreli Slot, MD;  Location: [facility] OR;  Service: Open Heart Surgery;  Laterality: N/A;   CORONARY ARTERY BYPASS GRAFT N/A 03/05/2022   Procedure: REDO CORONARY ARTERY BYPASS GRAFTING (CABG) X 3 WITH HARVESTED LEFT GREATER SAPHENOUS VEIN AND RIGHT RADIAL ARTERY;  Surgeon: Loreli Slot, MD;  Location: [facility] OR;  Service: Open Heart Surgery;  Laterality: N/A;   ENDOVEIN HARVEST OF GREATER SAPHENOUS VEIN Left 03/05/2022   Procedure: ENDOVEIN HARVEST OF GREATER SAPHENOUS VEIN;  Surgeon: Loreli Slot, MD;  Location: [facility] OR;  Service: Open Heart Surgery;  Laterality: Left;   LEFT HEART CATH AND CORS/GRAFTS ANGIOGRAPHY N/A 02/06/2022   Procedure: LEFT HEART CATH AND CORS/GRAFTS ANGIOGRAPHY;  Surgeon: [place], Andriana Casa M, MD;  Location: [facility] INVASIVE CV LAB;  Service: Cardiovascular;  Laterality: N/A;   LEFT HEART CATHETERIZATION WITH CORONARY ANGIOGRAM N/A 09/28/2014   Procedure: LEFT HEART CATHETERIZATION WITH CORONARY ANGIOGRAM;  Surgeon: Lennette Bihari, MD;  Location: [facility] CATH LAB;  Service: Cardiovascular;  Laterality: N/A;   RADIAL ARTERY HARVEST Right 03/05/2022   Procedure: RADIAL ARTERY HARVEST;  Surgeon: Loreli Slot, MD;  Location: [facility] OR;  Service: Open Heart Surgery;  Laterality: Right;   TEE WITHOUT CARDIOVERSION N/A 10/03/2014   Procedure:  TRANSESOPHAGEAL ECHOCARDIOGRAM (TEE);  Surgeon: Loreli Slot, MD;  Location: [facility] OR;  Service: Open Heart Surgery;  Laterality: N/A;   TEE WITHOUT CARDIOVERSION N/A 03/05/2022   Procedure: TRANSESOPHAGEAL ECHOCARDIOGRAM (TEE);  Surgeon: Loreli Slot, MD;  Location: [facility] OR;  Service: Open Heart Surgery;  Laterality: N/A;   TONSILLECTOMY  1950s   TRIGGER FINGER RELEASE Bilateral 1990s-2000s    Current Medications: Current Outpatient Medications  Medication Sig Dispense Refill   acetaminophen (TYLENOL) 325 MG tablet Take 2 tablets (650 mg total) by mouth every 6 (six) hours. (Patient taking differently: Take 650 mg by mouth as needed.)     allopurinol (ZYLOPRIM) 300 MG tablet Take 150 mg by mouth in the morning.     aspirin 81 MG EC tablet Take 81 mg by mouth every evening.     Cholecalciferol (VITAMIN D3) 25 MCG (1000 UT) CAPS Take 1,000 Units by mouth every evening.     ezetimibe (ZETIA) 10 MG tablet Take  10 mg by mouth daily.       folic acid (FOLVITE) 400 MCG tablet Take 400 mcg by mouth in the morning.     glipiZIDE (GLUCOTROL XL) 5 MG 24 hr tablet Take 5 mg by mouth daily with breakfast.     latanoprost (XALATAN) 0.005 % ophthalmic solution Place 1 drop into both eyes at bedtime.     levothyroxine (SYNTHROID, LEVOTHROID) 100 MCG tablet Take 100 mcg by mouth daily.       metFORMIN (GLUCOPHAGE) 500 MG tablet Take 2 tablets (1,000 mg total) by mouth in the morning and at bedtime. Take 2 tablets by mouth 2 times daily with meal.     metoprolol tartrate (LOPRESSOR) 25 MG tablet Take 1 tablet (25 mg total) by mouth 2 (two) times daily. 180 tablet 3   nitroGLYCERIN (NITROSTAT) 0.4 MG SL tablet Place 0.4 mg under the tongue every 5 (five) minutes as needed for chest pain. DISSOLVE ONE TABLET UNDER THE TONGUE EVERY 5 MINUTES AS NEEDED FOR CHEST PAIN.  DO NOT EXCEED A TOTAL OF 3 DOSES IN 15 MINUTES     omega-3 acid ethyl esters (LOVAZA) 1 g capsule Take 2 g by mouth 2 (two) times daily.      pantoprazole (PROTONIX) 40 MG tablet Take 1 tablet (40 mg total) by mouth daily. (Patient taking differently: Take 40 mg by mouth every evening.) 30 tablet 11   ramipril (ALTACE) 10 MG capsule Take 20 mg by mouth in the morning.     rosuvastatin (CRESTOR) 10 MG tablet Take 10 mg by mouth in the morning.     No current facility-administered medications for this visit.     Allergies:   Statins   Social History   Socioeconomic History   Marital status: Married    Spouse name: Not on file   Number of children: 1   Years of education: Not on file   Highest education level: Not on file  Occupational History   Occupation: Midwife [employer]  Tobacco Use   Smoking status: Former    Current packs/day: 0.00    Average packs/day: 1 pack/day for 20.0 years (20.0 ttl pk-yrs)    Types: Cigarettes    Start date: 09/20/1995    Quit date: 09/24/1995    Years since quitting: 28.3   Smokeless tobacco: Never  Vaping Use   Vaping status: Never Used  Substance and Sexual Activity   Alcohol use: Yes    Alcohol/week: 6.0 standard drinks of alcohol    Types: 6 Cans of beer per week    Comment: occasionally   Drug use: No   Sexual activity: Yes  Other Topics Concern   Not on file  Social History Narrative   Not on file   Social Drivers of Health   Financial Resource Strain: Low Risk  (12/30/2022)   Received from [place] of the [place], [facility] the [employer]   Overall [employer] (CARDIA)    Difficulty of Paying Living Expenses: Not hard at all  Food Insecurity: No Food Insecurity (02/21/2023)   Hunger Vital Sign    Worried About Running Out of Food in the Last Year: Never true    Ran Out of Food in the Last Year: Never true  Transportation Needs: No Transportation Needs (02/21/2023)   PRAPARE - Administrator, Civil Service (Medical): No    Lack of Transportation (Non-Medical): No  Physical Activity: Insufficiently Active (12/30/2022)   Received from  [place] of the [place], [place]  of the [facility]   Exercise Vital Sign    Days of Exercise per Week: 1 day    Minutes of Exercise per Session: 40 min  Stress: No Stress Concern Present (12/30/2022)   Received from [place] of the [place], [facility] of the [employer] of Occupational Health - Occupational Stress Questionnaire    Feeling of Stress : Only a little  Social Connections: Socially Isolated (12/30/2022)   Received from [place] of the [place], [facility] of the [employer] and Isolation Panel [NHANES]    Frequency of Communication with Friends and Family: Once a week    Frequency of Social Gatherings with Friends and Family: Once a week    Attends Religious Services: Never    Database administrator or Organizations: No    Attends Engineer, structural: Never    Marital Status: Married     Family History:  The patient's family history includes Colon cancer in his brother and mother; GI problems in his father; Heart attack in his sister; Heart failure in his mother.   ROS:   Please see the history of present illness.   All other systems are reviewed and otherwise negative.    PHYSICAL EXAM:   VS:  BP (!) 152/82 (BP Location: Left Arm, Patient Position: Sitting, Cuff Size: Normal)   Pulse (!) 56   Resp 16   Ht 6\' 1"  (1.854 m)   Wt 218 lb (98.9 kg)   SpO2 97%   BMI 28.76 kg/m   BMI: Body mass index is 28.76 kg/m. GENERAL:  Well appearing WM in NAD HEENT:  PERRL, EOMI, sclera are clear. Oropharynx is clear. NECK:  No jugular venous distention, carotid upstroke brisk and symmetric, no bruits, no thyromegaly or adenopathy LUNGS:  Clear to auscultation bilaterally CHEST:  Unremarkable HEART:  RRR,  PMI not displaced or sustained,S1 and S2 within normal limits, no S3, no S4: no clicks, no rubs, soft systolic murmur ABD:  Soft, nontender. BS +, no masses or bruits. No hepatomegaly, no splenomegaly EXT:  2 +  pulses throughout, no edema, no cyanosis no clubbing SKIN:  Warm and dry.  No rashes NEURO:  Alert and oriented x 3. Cranial nerves II through XII intact. PSYCH:  Cognitively intact   Wt Readings from Last 3 Encounters:  02/08/24 218 lb (98.9 kg)  07/19/23 215 lb 9.6 oz (97.8 kg)  06/14/23 214 lb 9.6 oz (97.3 kg)      Studies/Labs Reviewed:   EKG:   EKG is not ordered today.  Recent Labs: 02/24/2023: TSH 3.578 02/26/2023: ALT 69; BUN 12; Creatinine, Ser 0.91; Hemoglobin 11.6; Magnesium 2.5; Platelets 104; Potassium 4.5; Sodium 132   Lipid Panel    Component Value Date/Time   CHOL 130 02/04/2022 1104   TRIG 146 02/04/2022 1104   HDL 53 02/04/2022 1104   CHOLHDL 2.5 02/04/2022 1104   CHOLHDL 5.0 05/07/2016 0736   VLDL 44 (H) 05/07/2016 0736   LDLCALC 52 02/04/2022 1104    Additional studies/ records that were reviewed today include:  Labs dated 02/16/17: cholesterol 266, triglycerides 253, HDL 46, LDL 169. CBC normal except platelets 129K.  Dated 02/24/17: A1c 7.3%. Iron studies and TFTs normal. AST 90, ALT 128. Other chemistries are normal.  Dated 10/12/17: A1c 6%. Hgb normal. Potassium 5.2. AST 60. TSH normal. Triglycerides 98, HDL 58, LDL 161. Dated 11/29/18: potassium 5.1. creatinine 0.8. glucose 181. AST 97, ALT 108. Cholesterol 252, triglycerides 290, HDL 41, LDL  153. plts 143K. Otherwise CBC normal. Dated 08/14/20: cholesterol 188, triglycerides 163, HDL 46, LDL 113. AST 74, ALT 79. TSH normal. Platelets 138K. Glucose 143, A1c 7.2%. Dated 08/05/21: cholesterol 186, triglycerides 211, HDL 44, LDL 100. Plts 111K otherwise CBC normal. A1c 6.9%. sodium 135. AST and ALT 86. Glucose 170, TSH normal. Dated 07/29/22: A1c  7.1%. cholesterol 143, triglycerides 123, HDL 55, LDL 63. AST 52, ALT 45. Bili 1.6. otherwise CMET normal. TSH and CK normal.   Cardiac cath 02/06/22:  LEFT HEART CATH AND CORS/GRAFTS ANGIOGRAPHY   Conclusion      Mid LAD lesion is 90% stenosed.   Prox Cx to  Mid Cx lesion is 90% stenosed.   1st Mrg lesion is 90% stenosed.   Dist RCA lesion is 99% stenosed.   Ost RCA to Mid RCA lesion is 20% stenosed.   Origin lesion is 100% stenosed.   Origin to Prox Graft lesion is 100% stenosed.   Origin to Prox Graft lesion is 100% stenosed.   The left ventricular systolic function is normal.   LV end diastolic pressure is mildly elevated.   The left ventricular ejection fraction is 55-65% by visual estimate.   Severe 3 vessel obstructive CAD. There is significant progression of disease in the distal RCA and LCx at OM1 and OM2 bifurcation.  LIMA is now occluded. Known occlusion of SVGs Stents in proximal RCA are patent Good LV function Mildly elevated LVEDP   Plan; significant progression of disease since 2017 and now occluded LIMA to the LAD. Recommend CT surgery consult to consider whether he is a candidate for redo bypass. If not would at least do atherectomy and stenting of the LAD. Further PCI of the distal RCA would be very difficult since the vessel is hard to engage with prior stent at the ostium. The LCx has bifurcation disease.     Echo 02/06/22: IMPRESSIONS     1. Left ventricular ejection fraction, by estimation, is 55 to 60%. The  left ventricle has normal function. The left ventricle has no regional  wall motion abnormalities. There is mild concentric left ventricular  hypertrophy. Left ventricular diastolic  parameters are indeterminate.   2. Right ventricular systolic function is normal. The right ventricular  size is normal. Tricuspid regurgitation signal is inadequate for assessing  PA pressure.   3. The mitral valve is normal in structure. No evidence of mitral valve  regurgitation. No evidence of mitral stenosis.   4. The aortic valve is grossly normal. There is mild calcification of the  aortic valve. There is mild thickening of the aortic valve. Aortic valve  regurgitation is mild. No aortic stenosis is present.   5. The  inferior vena cava is normal in size with greater than 50%  respiratory variability, suggesting right atrial pressure of 3 mmHg.   6. Cannot exclude a small PFO.   Comparison(s): No prior Echocardiogram.   ASSESSMENT & PLAN:   CAD s/p CABG 2015. S/p complex rotational atherectomy and stenting of the RCA in July 2017 (failed SVG to RCA).   Cardiac cath showed occlusion of all prior bypass grafts and 3 vessel disease. EF is normal without significant valvular disease ( mild AI). Now s/p redo CABG on 03/05/22 with right radial to LAD, SVG to OM, SVG to PL. No angina. Continue ACEi, metoprolol, statin.  ASA. Encourage increased aerobic activity with walking.  Mild AI - no change on last Echo.  HTN - BP fairly good on home readings.  Hyperlipidemia -  On Zetia. Now on Crestor 10 mg daily. Did not tolerate lipitor in the past. Follow up labs with PCP. Goal LDL < 55. If not at goal would consider adding PCSK 9 inhibitor like Repatha Thrombocytopenia - no bleeding DM type 2. Now on glipizide and metformin. Reports yeast infection with Jardiance. Per PCP     Signed, Itai Barbian [place] MD, [facility]  02/08/2024 11:06 AM    [facility]

## 2024-02-07 ENCOUNTER — Encounter (HOSPITAL_BASED_OUTPATIENT_CLINIC_OR_DEPARTMENT_OTHER): Payer: Self-pay

## 2024-02-08 ENCOUNTER — Ambulatory Visit: Payer: Medicare PPO | Attending: Cardiology | Admitting: Cardiology

## 2024-02-08 ENCOUNTER — Encounter: Payer: Self-pay | Admitting: Cardiology

## 2024-02-08 VITALS — BP 152/82 | HR 56 | Resp 16 | Ht 73.0 in | Wt 218.0 lb

## 2024-02-08 DIAGNOSIS — E119 Type 2 diabetes mellitus without complications: Secondary | ICD-10-CM

## 2024-02-08 DIAGNOSIS — E7800 Pure hypercholesterolemia, unspecified: Secondary | ICD-10-CM | POA: Diagnosis not present

## 2024-02-08 DIAGNOSIS — Z951 Presence of aortocoronary bypass graft: Secondary | ICD-10-CM

## 2024-02-08 DIAGNOSIS — I25708 Atherosclerosis of coronary artery bypass graft(s), unspecified, with other forms of angina pectoris: Secondary | ICD-10-CM | POA: Diagnosis not present

## 2024-02-08 DIAGNOSIS — I10 Essential (primary) hypertension: Secondary | ICD-10-CM

## 2024-02-08 NOTE — Patient Instructions (Signed)
 Medication Instructions:  Continue same medications *If you need a refill on your cardiac medications before your next appointment, please call your pharmacy*  Lab Work: None ordered  Testing/Procedures: None ordered  Follow-Up: At [facility], you and your health needs are our priority.  As part of our continuing mission to provide you with exceptional heart care, our providers are all part of one team.  This team includes your primary Cardiologist (physician) and Advanced Practice Providers or APPs (Physician Assistants and Nurse Practitioners) who all work together to provide you with the care you need, when you need it.  Your next appointment:  6 months   Call in July to schedule Oct appointment     Provider:  Dr.Jordan  We recommend signing up for the patient portal called "MyChart".  Sign up information is provided on this After Visit Summary.  MyChart is used to connect with patients for Virtual Visits (Telemedicine).  Patients are able to view lab/test results, encounter notes, upcoming appointments, etc.  Non-urgent messages can be sent to your provider as well.   To learn more about what you can do with MyChart, go to [email or web address].         1st Floor: - Lobby - Registration  - Pharmacy  - Lab - Cafe  2nd Floor: - PV Lab - Diagnostic Testing (echo, CT, nuclear med)  3rd Floor: - Vacant  4th Floor: - TCTS (cardiothoracic surgery) - AFib Clinic - Structural Heart Clinic - Vascular Surgery  - Vascular Ultrasound  5th Floor: - HeartCare Cardiology (general and EP) - Clinical Pharmacy for coumadin, hypertension, lipid, weight-loss medications, and med management appointments    Valet parking services will be available as well.

## 2024-07-20 NOTE — Progress Notes (Signed)
 Cardiology Office Note    Date:  07/25/2024  ID:  Miguel Vasquez, DOB 1946-04-22, MRN [id number] PCP:  Miguel Lynwood SAUNDERS, MD  Cardiologist:  Eniya Cannady [place] MD  Chief Complaint: f/u CAD  History of Present Illness:  Miguel Vasquez is a 77 y.o. male seen for evaluation of DOE and some chest pain. He has a history of CAD (stent to Cx 1996, stent to RCA 2002, s/p CABGx3 in 2015, rotational atherectomy/DESx2 to RCA 05/2017), HLD (intolerant of statins due to elevated liver/muscle enzymes), HTN, DM, hemochromatosis, cervical disc disease, panic disorder, hypothyroidism, h/o marked sinus bradycardia (BB stopped)   Last 2D Echo 09/2014: mild-mod LVH, EF 55-60%, cannot exclude RWMA, mild AI. This past summer he was complaining of atypical chest pain but this was similar to what prompted CABG. Nuc was abnormal so he underwent diagnostic cath 05/11/16 showing severe, diffuse calcific disease and a very large dominant RCA - essentially, chronic occlusion at the ostium. There were brisk left to right collaterals. He had occluded SVG-OM. 50% mLAD, patent LIMA-LAD. LVEF and LVEDP were normal. He was brought back for staged PCI with successful rotational atherectomy and stenting of the ostial to mid RCA with DES x 2. DAPT was recommended indefinitely.  We had also checked an echo 05/07/16 for systolic murmur showing mod LVH, EF 60-65%, grade 1 DD, mild AI.   He did undergo cardiac cath  on 02/06/22. This demonstrated occlusion of prior bypass grafts including LIMA. Severe 3 vessel disease. EF is preserved. He was seen by Dr Kerrin on 02/19/22 for consideration of redo CABG. This was performed on 03/05/22 with right radial graft to the LAD, SVG to OM and SVG to PL branches. His post op course was uncomplicated and he was DC.   [name] was admitted to the hospital between 02/21/2023 and 02/27/2023, in the setting of acute cholecystitis, sepsis. He was found to have gangrenous cholecystitis. Patient also had transaminitis as  a result of gallbladder disease. He was placed on Augmentin  for 5 days. Aspirin  Zetia  Lopressor  and Plavix  were resumed on discharge on 02/22/2023.   On follow up today he is doing well. States he is a little more active since weather has cooled.   No chest pain or dyspnea. BP at home typically in 120-130s. Range 119-143. Diastolics normal.       Past Medical History:  Diagnosis Date   Aortic insufficiency    a. Mild AI by echo 04/2016.   Basal cell carcinoma, face    Coronary disease    a. stent to Cx 1996. b. stent to RCA 2002. c. s/p CABGx3 in 2015. d.  rotational atherectomy and DESx2 to ostial to mid RCA.   Elevated liver enzymes    GERD (gastroesophageal reflux disease)    H. pylori infection tx'd in the 1970s   Hemochromatosis    History of gout 1990s X 1   Hypercholesterolemia    Hypertension    Controlled   Hypothyroid    Myocardial infarction (HCC) 2002   mild   Obesity    Panic disorder    Hx of panic disorder   S/P primary angioplasty with coronary stent and rotational atherectomy of ostial mRCA with DES X 2-- 7/11/7 05/20/2016   Sinus bradycardia    a. h/o marked sinus bradycardia, BB stopped.   Tachycardia    a. Noted on stress test strips in 2017. ? Atrial flutter. Further w/u underway.   Thrombocytopenia (HCC)    a. per review of  labs.   Type II diabetes mellitus [facility])     Past Surgical History:  Procedure Laterality Date   BASAL CELL CARCINOMA EXCISION     face & lips   CARDIAC CATHETERIZATION  11//2015   CARDIAC CATHETERIZATION N/A 05/11/2016   Procedure: Left Heart Cath and Cors/Grafts Angiography;  Surgeon: Candyce GORMAN Reek, MD;  Location: [facility] INVASIVE CV LAB;  Service: Cardiovascular;  Laterality: N/A;   CARDIAC CATHETERIZATION N/A 05/19/2016   Procedure: Coronary/Graft Atherectomy;  Surgeon: Shariah Assad M [place], MD;  Location: [facility] INVASIVE CV LAB;  Service: Cardiovascular;  Laterality: N/A;   CARDIAC CATHETERIZATION N/A 05/19/2016   Procedure: Coronary  Stent Intervention;  Surgeon: Charlesa Ehle M [place], MD;  Location: [facility] INVASIVE CV LAB;  Service: Cardiovascular;  Laterality: N/A;   CATARACT EXTRACTION W/ INTRAOCULAR LENS  IMPLANT, BILATERAL Bilateral 2016   CHOLECYSTECTOMY N/A 02/25/2023   Procedure: LAPAROSCOPIC CHOLECYSTECTOMY;  Surgeon: Curvin Deward MOULD, MD;  Location: [facility] ORS;  Service: General;  Laterality: N/A;   CORONARY ANGIOPLASTY WITH STENT PLACEMENT  1996; 2002; 05/19/2016   1 + 2 +2   CORONARY ARTERY BYPASS GRAFT N/A 10/03/2014   Procedure: CORONARY ARTERY BYPASS GRAFTING (CABG) x  three, using left internal mammary artery and right leg greater saphenous vein harvested endoscopically;  Surgeon: Elspeth JAYSON Millers, MD;  Location: [facility] OR;  Service: Open Heart Surgery;  Laterality: N/A;   CORONARY ARTERY BYPASS GRAFT N/A 03/05/2022   Procedure: REDO CORONARY ARTERY BYPASS GRAFTING (CABG) X 3 WITH HARVESTED LEFT GREATER SAPHENOUS VEIN AND RIGHT RADIAL ARTERY;  Surgeon: Millers Elspeth JAYSON, MD;  Location: [facility] OR;  Service: Open Heart Surgery;  Laterality: N/A;   ENDOVEIN HARVEST OF GREATER SAPHENOUS VEIN Left 03/05/2022   Procedure: ENDOVEIN HARVEST OF GREATER SAPHENOUS VEIN;  Surgeon: Millers Elspeth JAYSON, MD;  Location: [facility] OR;  Service: Open Heart Surgery;  Laterality: Left;   LEFT HEART CATH AND CORS/GRAFTS ANGIOGRAPHY N/A 02/06/2022   Procedure: LEFT HEART CATH AND CORS/GRAFTS ANGIOGRAPHY;  Surgeon: [place], Aaren Atallah M, MD;  Location[facility] INVASIVE CV LAB;  Service: Cardiovascular;  Laterality: N/A;   LEFT HEART CATHETERIZATION WITH CORONARY ANGIOGRAM N/A 09/28/2014   Procedure: LEFT HEART CATHETERIZATION WITH CORONARY ANGIOGRAM;  Surgeon: Debby DELENA Sor, MD;  Location: [facility] CATH LAB;  Service: Cardiovascular;  Laterality: N/A;   RADIAL ARTERY HARVEST Right 03/05/2022   Procedure: RADIAL ARTERY HARVEST;  Surgeon: Millers Elspeth JAYSON, MD;  Location: [facility] OR;  Service: Open Heart Surgery;  Laterality: Right;   TEE WITHOUT CARDIOVERSION N/A 10/03/2014    Procedure: TRANSESOPHAGEAL ECHOCARDIOGRAM (TEE);  Surgeon: Elspeth JAYSON Millers, MD;  Location: [facility] OR;  Service: Open Heart Surgery;  Laterality: N/A;   TEE WITHOUT CARDIOVERSION N/A 03/05/2022   Procedure: TRANSESOPHAGEAL ECHOCARDIOGRAM (TEE);  Surgeon: Millers Elspeth JAYSON, MD;  Location: [facility] OR;  Service: Open Heart Surgery;  Laterality: N/A;   TONSILLECTOMY  1950s   TRIGGER FINGER RELEASE Bilateral 1990s-2000s    Current Medications: Current Outpatient Medications  Medication Sig Dispense Refill   allopurinol  (ZYLOPRIM ) 300 MG tablet Take 150 mg by mouth in the morning.     aspirin  81 MG EC tablet Take 81 mg by mouth every evening.     Cholecalciferol (VITAMIN D3) 25 MCG (1000 UT) CAPS Take 1,000 Units by mouth every evening.     ezetimibe  (ZETIA ) 10 MG tablet Take 10 mg by mouth daily.       folic acid  (FOLVITE ) 400 MCG tablet Take 400 mcg by mouth in the morning.  glipiZIDE  (GLUCOTROL  XL) 5 MG 24 hr tablet Take 5 mg by mouth daily with breakfast.     latanoprost  (XALATAN ) 0.005 % ophthalmic solution Place 1 drop into both eyes at bedtime.     levothyroxine  (SYNTHROID , LEVOTHROID) 100 MCG tablet Take 100 mcg by mouth daily.       metFORMIN  (GLUCOPHAGE ) 500 MG tablet Take 2 tablets (1,000 mg total) by mouth in the morning and at bedtime. Take 2 tablets by mouth 2 times daily with meal.     metoprolol  tartrate (LOPRESSOR ) 25 MG tablet Take 1 tablet (25 mg total) by mouth 2 (two) times daily. 180 tablet 3   nitroGLYCERIN  (NITROSTAT ) 0.4 MG SL tablet Place 0.4 mg under the tongue every 5 (five) minutes as needed for chest pain. DISSOLVE ONE TABLET UNDER THE TONGUE EVERY 5 MINUTES AS NEEDED FOR CHEST PAIN.  DO NOT EXCEED A TOTAL OF 3 DOSES IN 15 MINUTES     omega-3 acid ethyl esters (LOVAZA ) 1 g capsule Take 2 g by mouth 2 (two) times daily.     pantoprazole  (PROTONIX ) 40 MG tablet Take 1 tablet (40 mg total) by mouth daily. 30 tablet 11   ramipril  (ALTACE ) 10 MG capsule Take 20 mg by mouth  in the morning.     rosuvastatin  (CRESTOR ) 10 MG tablet Take 10 mg by mouth in the morning.     acetaminophen  (TYLENOL ) 325 MG tablet Take 2 tablets (650 mg total) by mouth every 6 (six) hours. (Patient taking differently: Take 650 mg by mouth as needed.)     No current facility-administered medications for this visit.     Allergies:   Statins   Social History   Socioeconomic History   Marital status: Married    Spouse name: Not on file   Number of children: 1   Years of education: Not on file   Highest education level: Not on file  Occupational History   Occupation: [employer]  Tobacco Use   Smoking status: Former    Current packs/day: 0.00    Average packs/day: 1 pack/day for 20.0 years (20.0 ttl pk-yrs)    Types: Cigarettes    Start date: 09/20/1995    Quit date: 09/24/1995    Years since quitting: 28.8   Smokeless tobacco: Never  Vaping Use   Vaping status: Never Used  Substance and Sexual Activity   Alcohol use: Yes    Alcohol/week: 6.0 standard drinks of alcohol    Types: 6 Cans of beer per week    Comment: occasionally   Drug use: No   Sexual activity: Yes  Other Topics Concern   Not on file  Social History Narrative   Not on file   Social Drivers of Health   Financial Resource Strain: Low Risk  (03/28/2024)   Received from [facility] [employer] Strain (CARDIA)    Difficulty of Paying Living Expenses: Not hard at all  Food Insecurity: No Food Insecurity (03/28/2024)   Received from [facility]   Hunger Vital Sign    Within the past 12 months, you worried that your food would run out before you got the money to buy more.: Never true    Within the past 12 months, the food you bought just didn't last and you didn't have money to get more.: Never true  Transportation Needs: No Transportation Needs (03/28/2024)   Received from [facility] - Transportation    Lack of  Transportation  (Medical): No    Lack of Transportation (Non-Medical): No  Physical Activity: Insufficiently Active (03/28/2024)   Received from [facility]   Exercise Vital Sign    On average, how many days per week do you engage in moderate to strenuous exercise (like a brisk walk)?: 2 days    On average, how many minutes do you engage in exercise at this level?: 30 min  Stress: No Stress Concern Present (03/28/2024)   Received from [facility] [employer] of Occupational Health - Occupational Stress Questionnaire    Feeling of Stress : Only a little  Social Connections: Moderately Isolated (03/28/2024)   Received from [facility] [employer] and Isolation Panel    In a typical week, how many times do you talk on the phone with family, friends, or neighbors?: Twice a week    How often do you get together with friends or relatives?: Once a week    How often do you attend church or religious services?: Never    Do you belong to any clubs or organizations such as church groups, unions, fraternal or athletic groups, or school groups?: No    How often do you attend meetings of the clubs or organizations you belong to?: Never    Are you married, widowed, divorced, separated, never married, or living with a partner?: Married     Family History:  The patient's family history includes Colon cancer in his brother and mother; GI problems in his father; Heart attack in his sister; Heart failure in his mother.   ROS:   Please see the history of present illness.   All other systems are reviewed and otherwise negative.    PHYSICAL EXAM:   VS:  BP (!) 156/78 (BP Location: Left Arm, Patient Position: Sitting, Cuff Size: Normal)   Pulse (!) 59   Ht 6' 0.84 (1.85 m)   Wt 213 lb (96.6 kg)   SpO2 95%   BMI 28.23 kg/m   BMI: Body mass index is 28.23 kg/m. GENERAL:  Well appearing WM in NAD HEENT:  PERRL, EOMI, sclera are clear. Oropharynx is  clear. NECK:  No jugular venous distention, carotid upstroke brisk and symmetric, no bruits, no thyromegaly or adenopathy LUNGS:  Clear to auscultation bilaterally CHEST:  Unremarkable HEART:  RRR,  PMI not displaced or sustained,S1 and S2 within normal limits, no S3, no S4: no clicks, no rubs, soft systolic murmur ABD:  Soft, nontender. BS +, no masses or bruits. No hepatomegaly, no splenomegaly EXT:  2 + pulses throughout, no edema, no cyanosis no clubbing SKIN:  Warm and dry.  No rashes NEURO:  Alert and oriented x 3. Cranial nerves II through XII intact. PSYCH:  Cognitively intact   Wt Readings from Last 3 Encounters:  07/25/24 213 lb (96.6 kg)  02/08/24 218 lb (98.9 kg)  07/19/23 215 lb 9.6 oz (97.8 kg)      Studies/Labs Reviewed:   EKG Interpretation Date/Time:  Tuesday July 25 2024 10:45:12 EDT Ventricular Rate:  55 PR Interval:  158 QRS Duration:  84 QT Interval:  472 QTC Calculation: 451 R Axis:   14  Text Interpretation: Sinus bradycardia with Premature atrial complexes Nonspecific T wave abnormality When compared with ECG of 14-Jun-2023 08:26, Premature atrial complexes are now Present Criteria for Inferior infarct are no longer Present Nonspecific T wave abnormality has replaced inverted T waves in Inferior leads Confirmed by [place], Caroleen Stoermer [phone]) on  07/25/2024 10:56:32 AM   Recent Labs: No results found for requested labs within last 365 days.   Lipid Panel    Component Value Date/Time   CHOL 130 02/04/2022 1104   TRIG 146 02/04/2022 1104   HDL 53 02/04/2022 1104   CHOLHDL 2.5 02/04/2022 1104   CHOLHDL 5.0 05/07/2016 0736   VLDL 44 (H) 05/07/2016 0736   LDLCALC 52 02/04/2022 1104    Additional studies/ records that were reviewed today include:  Labs dated 02/16/17: cholesterol 266, triglycerides 253, HDL 46, LDL 169. CBC normal except platelets 129K.  Dated 02/24/17: A1c 7.3%. Iron studies and TFTs normal. AST 90, ALT 128. Other chemistries are normal.   Dated 10/12/17: A1c 6%. Hgb normal. Potassium 5.2. AST 60. TSH normal. Triglycerides 98, HDL 58, LDL 161. Dated 11/29/18: potassium 5.1. creatinine 0.8. glucose 181. AST 97, ALT 108. Cholesterol 252, triglycerides 290, HDL 41, LDL 153. plts 143K. Otherwise CBC normal. Dated 08/14/20: cholesterol 188, triglycerides 163, HDL 46, LDL 113. AST 74, ALT 79. TSH normal. Platelets 138K. Glucose 143, A1c 7.2%. Dated 08/05/21: cholesterol 186, triglycerides 211, HDL 44, LDL 100. Plts 111K otherwise CBC normal. A1c 6.9%. sodium 135. AST and ALT 86. Glucose 170, TSH normal. Dated 07/29/22: A1c  7.1%. cholesterol 143, triglycerides 123, HDL 55, LDL 63. AST 52, ALT 45. Bili 1.6. otherwise CMET normal. TSH and CK normal.  Dated 03/22/24: A1c 8%. Cholesterol 131, triglycerides 181, HDL 44, LDL 57. AST 49. Glucose 175.  Dated 05/30/24: Hgb 13.6. plts 116K   Cardiac cath 02/06/22:  LEFT HEART CATH AND CORS/GRAFTS ANGIOGRAPHY   Conclusion      Mid LAD lesion is 90% stenosed.   Prox Cx to Mid Cx lesion is 90% stenosed.   1st Mrg lesion is 90% stenosed.   Dist RCA lesion is 99% stenosed.   Ost RCA to Mid RCA lesion is 20% stenosed.   Origin lesion is 100% stenosed.   Origin to Prox Graft lesion is 100% stenosed.   Origin to Prox Graft lesion is 100% stenosed.   The left ventricular systolic function is normal.   LV end diastolic pressure is mildly elevated.   The left ventricular ejection fraction is 55-65% by visual estimate.   Severe 3 vessel obstructive CAD. There is significant progression of disease in the distal RCA and LCx at OM1 and OM2 bifurcation.  LIMA is now occluded. Known occlusion of SVGs Stents in proximal RCA are patent Good LV function Mildly elevated LVEDP   Plan; significant progression of disease since 2017 and now occluded LIMA to the LAD. Recommend CT surgery consult to consider whether he is a candidate for redo bypass. If not would at least do atherectomy and stenting of the LAD.  Further PCI of the distal RCA would be very difficult since the vessel is hard to engage with prior stent at the ostium. The LCx has bifurcation disease.     Echo 02/06/22: IMPRESSIONS     1. Left ventricular ejection fraction, by estimation, is 55 to 60%. The  left ventricle has normal function. The left ventricle has no regional  wall motion abnormalities. There is mild concentric left ventricular  hypertrophy. Left ventricular diastolic  parameters are indeterminate.   2. Right ventricular systolic function is normal. The right ventricular  size is normal. Tricuspid regurgitation signal is inadequate for assessing  PA pressure.   3. The mitral valve is normal in structure. No evidence of mitral valve  regurgitation. No evidence of mitral stenosis.  4. The aortic valve is grossly normal. There is mild calcification of the  aortic valve. There is mild thickening of the aortic valve. Aortic valve  regurgitation is mild. No aortic stenosis is present.   5. The inferior vena cava is normal in size with greater than 50%  respiratory variability, suggesting right atrial pressure of 3 mmHg.   6. Cannot exclude a small PFO.   Comparison(s): No prior Echocardiogram.   ASSESSMENT & PLAN:   CAD s/p CABG 2015. S/p complex rotational atherectomy and stenting of the RCA in July 2017 (failed SVG to RCA).   Cardiac cath in 2023 showed occlusion of all prior bypass grafts and 3 vessel disease. EF is normal without significant valvular disease ( mild AI). Now s/p redo CABG on 03/05/22 with right radial to LAD, SVG to OM, SVG to PL. No angina. Continue ACEi, metoprolol , statin.  ASA. Encourage increased aerobic activity with walking.  Mild AI - no change on last Echo March 2023.  HTN - BP acceptable on home readings.  Hyperlipidemia -  On Zetia . Now on Crestor  10 mg daily. Did not tolerate lipitor in the past. Follow up labs look good.  Thrombocytopenia - no bleeding DM type 2. Now on glipizide  and  metformin . Reports yeast infection with Jardiance. Per PCP  I will follow up in 6 months    Signed, Tayra Dawe [place] MD, [facility]  07/25/2024 11:05 AM    [facility]

## 2024-07-25 ENCOUNTER — Ambulatory Visit: Attending: Cardiology | Admitting: Cardiology

## 2024-07-25 ENCOUNTER — Encounter: Payer: Self-pay | Admitting: Cardiology

## 2024-07-25 VITALS — BP 156/78 | HR 59 | Ht 72.84 in | Wt 213.0 lb

## 2024-07-25 DIAGNOSIS — E7800 Pure hypercholesterolemia, unspecified: Secondary | ICD-10-CM

## 2024-07-25 DIAGNOSIS — I25708 Atherosclerosis of coronary artery bypass graft(s), unspecified, with other forms of angina pectoris: Secondary | ICD-10-CM

## 2024-07-25 DIAGNOSIS — E119 Type 2 diabetes mellitus without complications: Secondary | ICD-10-CM

## 2024-07-25 DIAGNOSIS — I10 Essential (primary) hypertension: Secondary | ICD-10-CM

## 2024-07-25 DIAGNOSIS — Z951 Presence of aortocoronary bypass graft: Secondary | ICD-10-CM | POA: Diagnosis not present

## 2024-07-25 DIAGNOSIS — I159 Secondary hypertension, unspecified: Secondary | ICD-10-CM

## 2024-07-25 NOTE — Patient Instructions (Signed)

## 2025-02-02 ENCOUNTER — Ambulatory Visit: Admitting: Cardiology
